# Patient Record
Sex: Male | Born: 1940 | Race: Black or African American | Hispanic: No | Smoking: Never smoker
Health system: Southern US, Community
[De-identification: ages and names within clinical notes are randomized; demographics above are authoritative.]

## PROBLEM LIST (undated history)

## (undated) DIAGNOSIS — G4733 Obstructive sleep apnea (adult) (pediatric): Secondary | ICD-10-CM

## (undated) DIAGNOSIS — E669 Obesity, unspecified: Secondary | ICD-10-CM

## (undated) DIAGNOSIS — I499 Cardiac arrhythmia, unspecified: Secondary | ICD-10-CM

## (undated) DIAGNOSIS — I10 Essential (primary) hypertension: Secondary | ICD-10-CM

## (undated) DIAGNOSIS — N400 Enlarged prostate without lower urinary tract symptoms: Secondary | ICD-10-CM

## (undated) DIAGNOSIS — R0609 Other forms of dyspnea: Secondary | ICD-10-CM

## (undated) DIAGNOSIS — M1990 Unspecified osteoarthritis, unspecified site: Secondary | ICD-10-CM

## (undated) DIAGNOSIS — T8859XA Other complications of anesthesia, initial encounter: Secondary | ICD-10-CM

## (undated) DIAGNOSIS — N289 Disorder of kidney and ureter, unspecified: Secondary | ICD-10-CM

## (undated) DIAGNOSIS — E7800 Pure hypercholesterolemia, unspecified: Secondary | ICD-10-CM

## (undated) DIAGNOSIS — T7840XA Allergy, unspecified, initial encounter: Secondary | ICD-10-CM

## (undated) DIAGNOSIS — T4145XA Adverse effect of unspecified anesthetic, initial encounter: Secondary | ICD-10-CM

## (undated) DIAGNOSIS — M109 Gout, unspecified: Secondary | ICD-10-CM

## (undated) DIAGNOSIS — R0600 Dyspnea, unspecified: Secondary | ICD-10-CM

## (undated) DIAGNOSIS — R0989 Other specified symptoms and signs involving the circulatory and respiratory systems: Secondary | ICD-10-CM

## (undated) HISTORY — PX: KNEE ARTHROPLASTY: SHX992

## (undated) HISTORY — DX: Essential (primary) hypertension: I10

## (undated) HISTORY — DX: Obesity, unspecified: E66.9

## (undated) HISTORY — PX: JOINT REPLACEMENT: SHX530

## (undated) HISTORY — DX: Allergy, unspecified, initial encounter: T78.40XA

## (undated) HISTORY — DX: Pure hypercholesterolemia, unspecified: E78.00

## (undated) HISTORY — DX: Gout, unspecified: M10.9

## (undated) HISTORY — PX: OTHER SURGICAL HISTORY: SHX169

## (undated) HISTORY — DX: Other specified symptoms and signs involving the circulatory and respiratory systems: R09.89

## (undated) HISTORY — DX: Obstructive sleep apnea (adult) (pediatric): G47.33

## (undated) HISTORY — PX: COLONOSCOPY: SHX174

## (undated) HISTORY — DX: Unspecified osteoarthritis, unspecified site: M19.90

## (undated) HISTORY — DX: Disorder of kidney and ureter, unspecified: N28.9

## (undated) HISTORY — DX: Other forms of dyspnea: R06.09

---

## 1996-02-10 HISTORY — PX: OTHER SURGICAL HISTORY: SHX169

## 1997-08-29 ENCOUNTER — Ambulatory Visit (HOSPITAL_COMMUNITY): Admission: RE | Admit: 1997-08-29 | Discharge: 1997-08-29 | Payer: Self-pay | Admitting: Internal Medicine

## 1997-08-30 ENCOUNTER — Ambulatory Visit (HOSPITAL_COMMUNITY): Admission: RE | Admit: 1997-08-30 | Discharge: 1997-08-30 | Payer: Self-pay | Admitting: Internal Medicine

## 1999-04-10 ENCOUNTER — Emergency Department (HOSPITAL_COMMUNITY): Admission: EM | Admit: 1999-04-10 | Discharge: 1999-04-10 | Payer: Self-pay | Admitting: Emergency Medicine

## 1999-08-11 ENCOUNTER — Encounter: Payer: Self-pay | Admitting: Internal Medicine

## 1999-08-11 ENCOUNTER — Ambulatory Visit (HOSPITAL_COMMUNITY): Admission: RE | Admit: 1999-08-11 | Discharge: 1999-08-11 | Payer: Self-pay | Admitting: Internal Medicine

## 1999-08-18 ENCOUNTER — Encounter: Payer: Self-pay | Admitting: Internal Medicine

## 1999-08-18 ENCOUNTER — Encounter: Admission: RE | Admit: 1999-08-18 | Discharge: 1999-08-18 | Payer: Self-pay | Admitting: Internal Medicine

## 1999-09-08 ENCOUNTER — Other Ambulatory Visit: Admission: RE | Admit: 1999-09-08 | Discharge: 1999-09-08 | Payer: Self-pay | Admitting: Urology

## 1999-09-10 ENCOUNTER — Ambulatory Visit (HOSPITAL_COMMUNITY): Admission: RE | Admit: 1999-09-10 | Discharge: 1999-09-10 | Payer: Self-pay | Admitting: Urology

## 1999-09-10 ENCOUNTER — Encounter: Payer: Self-pay | Admitting: Urology

## 1999-10-10 ENCOUNTER — Ambulatory Visit (HOSPITAL_COMMUNITY): Admission: RE | Admit: 1999-10-10 | Discharge: 1999-10-10 | Payer: Self-pay | Admitting: Urology

## 1999-10-10 ENCOUNTER — Ambulatory Visit (HOSPITAL_BASED_OUTPATIENT_CLINIC_OR_DEPARTMENT_OTHER): Admission: RE | Admit: 1999-10-10 | Discharge: 1999-10-10 | Payer: Self-pay | Admitting: Urology

## 1999-10-10 ENCOUNTER — Encounter: Payer: Self-pay | Admitting: Urology

## 2000-07-23 ENCOUNTER — Ambulatory Visit (HOSPITAL_COMMUNITY): Admission: RE | Admit: 2000-07-23 | Discharge: 2000-07-23 | Payer: Self-pay | Admitting: *Deleted

## 2001-01-27 ENCOUNTER — Encounter: Payer: Self-pay | Admitting: Urology

## 2001-01-27 ENCOUNTER — Encounter: Admission: RE | Admit: 2001-01-27 | Discharge: 2001-01-27 | Payer: Self-pay | Admitting: Urology

## 2001-02-18 ENCOUNTER — Ambulatory Visit (HOSPITAL_BASED_OUTPATIENT_CLINIC_OR_DEPARTMENT_OTHER): Admission: RE | Admit: 2001-02-18 | Discharge: 2001-02-18 | Payer: Self-pay | Admitting: Urology

## 2001-02-18 ENCOUNTER — Encounter (INDEPENDENT_AMBULATORY_CARE_PROVIDER_SITE_OTHER): Payer: Self-pay | Admitting: Specialist

## 2002-07-18 ENCOUNTER — Encounter (INDEPENDENT_AMBULATORY_CARE_PROVIDER_SITE_OTHER): Payer: Self-pay | Admitting: Interventional Cardiology

## 2002-07-18 ENCOUNTER — Inpatient Hospital Stay (HOSPITAL_COMMUNITY): Admission: EM | Admit: 2002-07-18 | Discharge: 2002-07-19 | Payer: Self-pay | Admitting: Internal Medicine

## 2002-07-23 ENCOUNTER — Emergency Department (HOSPITAL_COMMUNITY): Admission: EM | Admit: 2002-07-23 | Discharge: 2002-07-23 | Payer: Self-pay | Admitting: Emergency Medicine

## 2002-07-31 ENCOUNTER — Inpatient Hospital Stay (HOSPITAL_COMMUNITY): Admission: AD | Admit: 2002-07-31 | Discharge: 2002-08-01 | Payer: Self-pay | Admitting: Internal Medicine

## 2002-08-01 ENCOUNTER — Encounter: Payer: Self-pay | Admitting: Interventional Cardiology

## 2002-08-07 ENCOUNTER — Ambulatory Visit (HOSPITAL_COMMUNITY): Admission: RE | Admit: 2002-08-07 | Discharge: 2002-08-08 | Payer: Self-pay | Admitting: Internal Medicine

## 2002-12-27 ENCOUNTER — Ambulatory Visit (HOSPITAL_COMMUNITY): Admission: RE | Admit: 2002-12-27 | Discharge: 2002-12-27 | Payer: Self-pay | Admitting: Internal Medicine

## 2010-01-24 ENCOUNTER — Encounter
Admission: RE | Admit: 2010-01-24 | Discharge: 2010-01-24 | Payer: Self-pay | Source: Home / Self Care | Attending: Internal Medicine | Admitting: Internal Medicine

## 2010-04-01 ENCOUNTER — Ambulatory Visit (HOSPITAL_BASED_OUTPATIENT_CLINIC_OR_DEPARTMENT_OTHER): Payer: Medicare Other | Attending: Internal Medicine

## 2010-04-01 DIAGNOSIS — G4733 Obstructive sleep apnea (adult) (pediatric): Secondary | ICD-10-CM | POA: Insufficient documentation

## 2010-04-01 DIAGNOSIS — R259 Unspecified abnormal involuntary movements: Secondary | ICD-10-CM | POA: Insufficient documentation

## 2010-04-16 ENCOUNTER — Encounter (HOSPITAL_COMMUNITY): Payer: Medicare Other | Attending: Orthopedic Surgery

## 2010-04-16 ENCOUNTER — Other Ambulatory Visit: Payer: Self-pay | Admitting: Orthopedic Surgery

## 2010-04-16 DIAGNOSIS — Z01818 Encounter for other preprocedural examination: Secondary | ICD-10-CM | POA: Insufficient documentation

## 2010-04-16 LAB — CBC
HCT: 45.2 % (ref 39.0–52.0)
MCHC: 31.6 g/dL (ref 30.0–36.0)
MCV: 87.1 fL (ref 78.0–100.0)
RDW: 14 % (ref 11.5–15.5)

## 2010-04-16 LAB — DIFFERENTIAL
Basophils Relative: 1 % (ref 0–1)
Eosinophils Relative: 4 % (ref 0–5)
Monocytes Absolute: 0.6 10*3/uL (ref 0.1–1.0)
Monocytes Relative: 8 % (ref 3–12)
Neutrophils Relative %: 50 % (ref 43–77)

## 2010-04-16 LAB — BASIC METABOLIC PANEL
BUN: 15 mg/dL (ref 6–23)
CO2: 33 mEq/L — ABNORMAL HIGH (ref 19–32)
Chloride: 101 mEq/L (ref 96–112)
GFR calc Af Amer: >60 mL/min (ref >60)
GFR calc non Af Amer: 51 mL/min — ABNORMAL LOW (ref >60)

## 2010-04-16 LAB — URINALYSIS, ROUTINE W REFLEX MICROSCOPIC
Nitrite: NEGATIVE
Protein, ur: NEGATIVE mg/dL
Specific Gravity, Urine: 1.022 (ref 1.005–1.030)
Urobilinogen, UA: 0.2 mg/dL (ref 0.0–1.0)
pH: 5.5 (ref 5.0–8.0)

## 2010-04-16 LAB — URINE MICROSCOPIC-ADD ON

## 2010-04-18 NOTE — H&P (Signed)
  NAME:  Pedro Lee, Pedro Lee NO.:  [id number]  MEDICAL RECORD NO.:  [id number]           PATIENT TYPE:  LOCATION:                                 FACILITY:  PHYSICIAN:  Madlyn Frankel. Charlann Boxer, M.D.  DATE OF BIRTH:  January 20, 1941  DATE OF ADMISSION:  04/25/2010 DATE OF DISCHARGE:                             HISTORY & PHYSICAL   ADMITTING DIAGNOSIS:  Left knee osteoarthritis.  BRIEF HISTORY:  Pedro Lee is a pleasant 70 year old gentleman with bilateral knee advanced osteoarthritis scheduled to have left total knee replacement.  He had been seen and evaluated in office and failed conservative measures and at this point is ready to proceed with knee arthroplasty.  Risks and benefits were reviewed and answered in the office setting.  PAST MEDICAL HISTORY: 1. Atrial fibrillation. 2. Hypertension.  SURGICAL HISTORY:  Left knee arthroscopy 15 years ago.  MEDICATIONS:  Toprol, benazepril, Flomax and aspirin.  Dosages will be obtained through the medical reconciliation evaluation through the pharmacist.  DRUG ALLERGIES:  PENICILLIN.  FAMILY HISTORY:  Coronary artery disease and hypertension.  SOCIAL HISTORY:  He is retired.  Denies smoking or alcohol.  PRIMARY CARE PHYSICIAN:  Pedro L. August Saucer, MD  REVIEW OF SYSTEMS:  Otherwise negative for any recent hospitalizations, fevers, chills, night sweats, blurred vision, or slurred speech.  No rhinitis, productive cough, or hemoptysis.  No chest pains.  No abdominal discomfort, nausea, vomiting, burning or frequency with urination.  No unilateral weakness or numbness, only that explained in the musculoskeletal section of HPI.  PHYSICAL EXAMINATION:  GENERAL:  Pedro Lee is a pleasant 70 year old gentleman.  He was awake, alert, and oriented.  He was stable with no blurred vision or slurred speech. VITAL SIGNS:  His blood pressure was 135/86.  His pulse was 72. CHEST:  Clear to auscultation bilaterally. HEART:  At this point had a  stable rhythm. ABDOMEN:  Soft and nontender. SKIN:  Intact with no blistering or cellulitis.  He was otherwise neurovascularly intact distally with no venous lymphatic changes. EXTREMITIES:  Left knee examination revealed tenderness with genu varum with stable ligament, slight flexion contracture and pain with range of motion.  Preoperative labs, EKG and chest x-ray will be performed in the [facility] system.  X-rays of his knees obtained in our office had revealed advanced bilateral knee osteoarthritis.  ASSESSMENT:  Advanced left knee osteoarthritis.  PLAN:  The patient will be admitted for same-day surgery on April 25, 2010, to undergo left total knee replacement.  He will receive a preoperative tranexamic acid for perioperative blood loss purposes.  The questionnaire for preoperative screen was filled out.  He at the time of evaluation weighed 134 kg.  Suggestions will be pending that.  Questions encouraged, answered and reviewed.     Madlyn Frankel Charlann Boxer, M.D.     MDO/MEDQ  D:  04/14/2010  T:  04/14/2010  Job:  [id number]  cc:   Pedro Lee, M.D. [address] [address] [place] [address]  Pedro Lee, M.D. Fax: [phone]  Electronically Signed by Pedro Lee M.D. on 04/18/2010 07:03:27 AM

## 2010-04-25 ENCOUNTER — Inpatient Hospital Stay (HOSPITAL_COMMUNITY)
Admission: RE | Admit: 2010-04-25 | Discharge: 2010-04-28 | DRG: 470 | Disposition: A | Discharge status: Home Health | Payer: Medicare Other | Source: Ambulatory Visit | Attending: Orthopedic Surgery | Admitting: Orthopedic Surgery

## 2010-04-25 DIAGNOSIS — I4891 Unspecified atrial fibrillation: Secondary | ICD-10-CM | POA: Diagnosis present

## 2010-04-25 DIAGNOSIS — R339 Retention of urine, unspecified: Secondary | ICD-10-CM | POA: Diagnosis not present

## 2010-04-25 DIAGNOSIS — I10 Essential (primary) hypertension: Secondary | ICD-10-CM | POA: Diagnosis present

## 2010-04-25 DIAGNOSIS — G4733 Obstructive sleep apnea (adult) (pediatric): Secondary | ICD-10-CM | POA: Diagnosis present

## 2010-04-25 DIAGNOSIS — M1710 Unilateral primary osteoarthritis, unspecified knee: Principal | ICD-10-CM | POA: Diagnosis present

## 2010-04-26 LAB — CBC
Hemoglobin: 10 g/dL — ABNORMAL LOW (ref 13.0–17.0)
MCH: 27.6 pg (ref 26.0–34.0)
MCV: 88.1 fL (ref 78.0–100.0)
RBC: 3.62 MIL/uL — ABNORMAL LOW (ref 4.22–5.81)

## 2010-04-26 LAB — BASIC METABOLIC PANEL
BUN: 12 mg/dL (ref 6–23)
Calcium: 7.4 mg/dL — ABNORMAL LOW (ref 8.4–10.5)
GFR calc non Af Amer: >60 mL/min (ref >60)
Glucose, Bld: 100 mg/dL — ABNORMAL HIGH (ref 70–99)
Potassium: 4.8 mEq/L (ref 3.5–5.1)

## 2010-04-27 LAB — CBC
HCT: 33.7 % — ABNORMAL LOW (ref 39.0–52.0)
MCHC: 32 g/dL (ref 30.0–36.0)
Platelets: 162 10*3/uL (ref 150–400)
RDW: 14.2 % (ref 11.5–15.5)

## 2010-04-27 LAB — BASIC METABOLIC PANEL
CO2: 26 mEq/L (ref 19–32)
Calcium: 8.2 mg/dL — ABNORMAL LOW (ref 8.4–10.5)
GFR calc Af Amer: 59 mL/min — ABNORMAL LOW (ref >60)
Potassium: 3.6 mEq/L (ref 3.5–5.1)

## 2010-04-28 NOTE — Op Note (Signed)
NAME:  Pedro Lee, Pedro Lee                ACCOUNT NO.:  [id number]  MEDICAL RECORD NO.:  [id number]           PATIENT TYPE:  I  LOCATION:  1604                         FACILITY:  [facility]  PHYSICIAN:  Madlyn Frankel. Charlann Boxer, M.D.  DATE OF BIRTH:  Feb 02, 1941  DATE OF PROCEDURE:  04/25/2010 DATE OF DISCHARGE:                              OPERATIVE REPORT   PREOPERATIVE DIAGNOSIS:  Left knee osteoarthritis.  POSTOPERATIVE DIAGNOSIS:  Left knee osteoarthritis.  PROCEDURE:  Left total knee replacement.  COMPONENTS USED:  Rotating platform posterior stabilized knee system size 5 femur, 5 tibia, 12.5 insert, and a 41 patellar button.  SURGEON:  Madlyn Frankel. Charlann Boxer, M.D.  ASSISTANT:  Jaquelyn Bitter. Chabon, P.A.  ANESTHESIA:  Preoperative regional femoral nerve block plus general anesthetic.  SPECIMENS:  None.  COMPLICATIONS:  None.  DRAINS:  One Hemovac.  TOURNIQUET TIME:  49 minutes at 250 mmHg.  ESTIMATED BLOOD LOSS:  Minimal.  INDICATIONS OF PROCEDURE:  [name] is a pleasant 70 year old gentleman who had presented to the office for evaluation of bilateral knee pain. Radiographs revealed advanced degenerative changes.  He had failed respond to conservative measures, treating with other physicians, and at this point wished to discuss surgical options.  Risks of infection, DVT, component failure, need for revision surgery, the postoperative course, and expectations were all reviewed and consent obtained for the benefit of pain relief.  PROCEDURE IN DETAIL:  The patient was brought to the operative theater. Once adequate anesthesia, preoperative antibiotics, clindamycin administered.  The patient was positioned supine with the left thigh tourniquet placed.  The left lower extremity was then prepped and draped in a sterile fashion.  The left foot was placed in the [facility] leg holder.  Time-out was performed identifying the patient, planned procedure, and the extremity.  The left lower extremity  was then exsanguinated, tourniquet elevated to 250 mmHg.  Midline incision was made followed by median parapatellar arthrotomy.  Following initial exposure, attention was first directed to the patella.  Precut measurement was 27 mm.  I resected down to about 14- 15 mm and drilled lug holes, placed in a metal shim to protect the patella from retractors and saw blades.  Attention was now directed to the femur.  The femoral canal was opened to drill, irrigated to try to prevent fat emboli.  The intramedullary rod was then passed and a 5 degrees of valgus I resected 11 mm of bone off the distal femur due to the possibility of being a size 6 femur.  No significant full flexion contracture preoperatively.  Following the resection, I subluxated the tibia anteriorly.  Using an extramedullary guide, I resected approximately 10 mm bone off the proximal lateral tibia.  At this point, I confirmed the gap was stable with at least a 10- mm insert medially and laterally and also that the cut was perpendicular in the coronal plane.  I sized the femur initiallyto a size 6 and pinned the rotation block into place utilizing the C clamp off the proximal tibia and anterior referencing.  Following making the anterior, posterior, and chamfer cuts with a size 6 cutting block,  I recognized that I was a bit anterior on the flange and could in fact drop it down 1 mm or 2.  Based on the assessment at this point of my flexion gap compared to my extension gap, I decided to downsize from a size 6 to a 5 repositioning the 4 in 1 cutting block initially based off the anterior reference and then dropping down 10 mm, I revisited my anterior, posterior, and chamfer cuts removing approximately 2 mm of bone.  There was no notching and in fact now that the femoral cut was a flushed over the anterior surface of the cortex and femur.  The final box was made now of the lateral aspect of distal femur.  I removed medial and  lateral osteophytes.  Tibia was now subluxated anteriorly.  The cut surface was best fit with size 5 tibial tray.  I removed medial osteophytes, pinned the tray into position, drilled, and keel punched.  A trial reduction was now carried out with 5 femur, 5 tibia, and a 12.5 insert.  The knee came to full extension and felt stable from extension and flexion with stable ligaments, the patella tracked through the trochlea without application of pressure.  At this point, all trial components were removed.  Final osteophytes were removed off the posterior aspect of the femur.  Cement was mixed.  The knee was irrigated with normal saline solution, pulse lavage and the synovial capsule junction and the knee injected with 0.25% Marcaine with epinephrine and 1 cc of Toradol.  Following this, the cut surfaces were dried and the components cemented onto clean and dry cut surfaces of bone and the knee brought to extension with a 12.5 insert in place.  Extruded cement was removed.  Once the cement had fully cured, an excessive cement was removed throughout the knee.  I chose a 12.5 insert, which was then placed into the tibial tray.  The knee was then re-irrigated at this point, the tourniquet had been let down, there is no significant hemostasis required, a medium Hemovac drain was placed deep.  The extensor mechanism was then reapproximated using #1 Vicryl with the knee in flexion.  The remaining wound was closed with 2-0 Vicryl and a running 4-0 Monocryl.  The knee was cleaned, dried, and dressed sterilely with Dermabond and Aquacel dressing.  The knee was wrapped loosely with an Ace wrap.  He was then brought to the recovery room, extubated in stable condition tolerating the procedure well.     Madlyn Frankel Charlann Boxer, M.D.     MDO/MEDQ  D:  04/25/2010  T:  04/26/2010  Job:  [id number]  Electronically Signed by Durene Romans M.D. on 04/28/2010 09:48:56 AM

## 2010-04-30 NOTE — Discharge Summary (Signed)
NAME:  Pedro Lee, COOKSEY                ACCOUNT NO.:  [id number]  MEDICAL RECORD NO.:  [id number]           PATIENT TYPE:  I  LOCATION:  1604                         FACILITY:  [facility]  PHYSICIAN:  Madlyn Frankel. Charlann Boxer, M.D.  DATE OF BIRTH:  Feb 02, 1941  DATE OF ADMISSION:  04/25/2010 DATE OF DISCHARGE:  04/28/2010                              DISCHARGE SUMMARY   ADMITTING DIAGNOSIS:  Left knee osteoarthritis.  DISCHARGE DIAGNOSES: 1. Left knee osteoarthritis. 2. History of atrial fibrillation. 3. Hypertension. 4. Urinary retention, following Foley catheter placement.  BRIEF HISTORY:  Pedro Lee is a very pleasant 70 year old gentleman who was evaluated in the office for advanced bilateral knee osteoarthritis. His left knee was clinically worse.  After reviewing the fact he had failed conservative measures and wishing to have improved quality of life, he wished at this point to proceed with knee replacement surgery. Risks and benefits were discussed in the office and surgery was set up.  HOSPITAL COURSE:  The patient was admitted to the hospital for same-day surgery on April 25, 2010.  He underwent an uncomplicated left total knee replacement.  Please see dictated operative note for full details of the procedure.  Postoperatively after a brief stay in the Recovery Room, he was transferred to Orthopedic Ward.  His surgery was performed on a Friday and thus, he was in the hospital over the weekend.  He was seen and evaluated by Physical Therapy and was weightbearing as tolerated and work on range of motion and strengthening.  No CPM was utilized.  His lab work remained stable while in the hospital and by postoperative day #2, he had a stable hematocrit of 33.7 and a platelet count of 162,000. His electrolytes remained stable.  His creatinine was noted to be 1.43 on postop day #2.  Labs were not repeated on date of discharge.  On postop day #1, his Foley catheter was removed.  He did  require several in and out catheterizations and ultimately Foley was replaced. Unfortunately, even at the date of discharge after separate trial of discharge, removing his Foley in the morning and he required replacing it at 4 p.m.  The plan was for him to maintain his Foley himself and then follow up with a urologist within a week.  He had been on Flomax preoperatively, so either he would follow up with his urologist or with [facility].  Arrangements will be made at discharge.  Otherwise, his left knee wound remained dry.  There was no evidence of any complicating features.  By postop day #3, he was ready for discharge with the outlined plan.  DISCHARGE INSTRUCTIONS:  The patient will be discharged home with Home Health Physical Therapy and nurse will come to help with Foley management and care.  From a urinary retention standpoint, he will maintain his Foley for week and follow up with Urology.  Appointment should have been made at the time of discharge, otherwise he can contact our office for assistance with this.  Mr. Pedro Lee should follow up with Dr. Durene Romans at [facility] in 2 weeks' time, office number [phone], appointment was probably  already set.  He is to keep his wound dry, but may shower until followup.  DISCHARGE MEDICATIONS:  Discharge medications include medicines prescribed preoperative including: 1. Colace 100 mg p.o. b.i.d. constipation while on pain medicine. 2. MiraLax 17 gm p.o. daily as needed for constipation while on pain     medicine. 3. Norco 7.5/325 one to two tablets every 4 to 6 hours as needed for     pain. 4. Robaxin 500 mg p.o. q.6h. as needed for muscle spasm or pain. 5. Iron 325 mg p.o. b.i.d. for 2 weeks and then off. 6. Xarelto 10 mg p.o. daily for 10 days and after that he will take     aspirin 325 mg for 4 weeks. 7. In additional, he will continue on his home medications of Toprol,     benazepril, Flomax and  aspirin will be held according to use of     Xarelto.     Madlyn Frankel Charlann Boxer, M.D.     MDO/MEDQ  D:  04/30/2010  T:  04/30/2010  Job:  [id number]  Electronically Signed by Durene Romans M.D. on 04/30/2010 07:20:10 PM

## 2010-05-21 ENCOUNTER — Emergency Department (HOSPITAL_COMMUNITY)
Admission: EM | Admit: 2010-05-21 | Discharge: 2010-05-21 | Disposition: A | Discharge status: Home / Self Care | Payer: Medicare Other | Attending: Emergency Medicine | Admitting: Emergency Medicine

## 2010-05-21 DIAGNOSIS — I10 Essential (primary) hypertension: Secondary | ICD-10-CM | POA: Insufficient documentation

## 2010-05-21 DIAGNOSIS — R300 Dysuria: Secondary | ICD-10-CM | POA: Insufficient documentation

## 2010-05-21 DIAGNOSIS — N390 Urinary tract infection, site not specified: Secondary | ICD-10-CM | POA: Insufficient documentation

## 2010-05-21 LAB — URINALYSIS, ROUTINE W REFLEX MICROSCOPIC
Nitrite: NEGATIVE
Specific Gravity, Urine: 1.02 (ref 1.005–1.030)
Urobilinogen, UA: 0.2 mg/dL (ref 0.0–1.0)

## 2010-05-21 LAB — URINE MICROSCOPIC-ADD ON

## 2010-05-22 ENCOUNTER — Encounter: Payer: Self-pay | Admitting: Internal Medicine

## 2010-05-24 LAB — URINE CULTURE

## 2010-06-05 ENCOUNTER — Encounter: Payer: Self-pay | Admitting: Internal Medicine

## 2010-06-27 NOTE — Op Note (Signed)
NAME:  Pedro Lee, Pedro Lee NO.:  [id number]   MEDICAL RECORD NO.:  [id number]                   PATIENT TYPE:  OIB   LOCATION:  2857                                 FACILITY:  [facility]   PHYSICIAN:  Duke Salvia, M.D.               DATE OF BIRTH:  1940/04/24   DATE OF PROCEDURE:  08/07/2002  DATE OF DISCHARGE:                                 OPERATIVE REPORT   PREOPERATIVE DIAGNOSIS:  Supraventricular tachycardia.   POSTOPERATIVE DIAGNOSIS:  AV nodal reentrant tachycardia.   PROCEDURE:  Invasive electrophysiological study with radiofrequency catheter  ablation; arrhythmia mapping, and conscious sedation.   DESCRIPTION OF PROCEDURE:  Cardiac catheterization was performed with local  anesthesia and conscious sedation.  Noninvasive blood pressure monitoring,  transcutaneous oxygen saturation monitoring were performed continuously  throughout the procedure. Following the procedure, the catheters were  removed, hemostasis was obtained, and the patient was transferred back to  the floor in stable condition.   CATHETERS:  A 5 French quadripolar catheter was inserted via the left  femoral vein to the high right atrium.  A 5 French quadripolar catheter was  inserted via the left femoral vein to the AV junction.  A 5 French  quadripolar catheter was inserted via the left femoral vein to the right  ventricular apex.  A 6 French octapolar catheter was inserted via the right  femoral vein to the coronary sinus. A 7 French 4 mm deflectable tip ablation  catheter was inserted via the right femoral vein to mapping sites in the  posterior septal space.   Surface leads 1, aVF, and V1 were monitored continuously throughout the  procedure.  Following insertion of the catheters, the stimulation protocol  included incremental atrial pacing, incremental ventricular pacing, single  and double atrial extrastimuli at paced cycle lengths of 700 and 600  milliseconds.  Single  ventricular extrastimuli at a paced cycle length of  600 milliseconds.   RESULTS:  Surface electrocardiogram             INITIAL           FINAL  And basic intervals.   Rhythm                              Sinus             Sinus  Cycle length                              915 milliseconds  844 milliseconds  PR interval                         189 milliseconds  194 milliseconds  QRS duration  114 milliseconds  118 milliseconds  QT interval                               407 milliseconds  362 milliseconds  P wave duration                     161 milliseconds  165 milliseconds  Bundle branch block                 Absent            Absent  Preexcitation                       Absent            Absent   AH interval                         71 milliseconds   114 milliseconds  HV interval                         40 milliseconds   49 milliseconds  His bundle duration                 21 milliseconds   21 milliseconds   AV nodal function;  AV Wenckebach preablation was less than 380 milliseconds  which was associated with a tachycardia induction.  Postablation it was 470  milliseconds.  VA conduction Wenckebach at 500 milliseconds.  AV nodal  refractoriness was in the fast pathway at 600 milliseconds was approximately  440 milliseconds followed by induction of tachycardia.  The full window and  curve was not defined.   AV nodal conduction was discontinuous of echobeats and inducible tachycardia  preablation and was continuous albeit with slow antegrade conduction still  identified postablation.  No echobeats or tachycardia were inducible  postablation.   Accessory pathway function;  no evidence of an accessory pathway was  identified.   Ventricular response to programmed stimulation;  normal for the stimulation  as described above.   Arrhythmias induced;  slow-fast AV nodal reentrant was reproducibly induced  with atrial burst pacing at 400 to 420 milliseconds and single  atrial  extrastimuli at 600:400-390.  It was reproducibly terminated with  ventricular pacing at 300 milliseconds.   During a typical episode of tachycardia, the VA time was 36 milliseconds  with an AH interval of 354 milliseconds and an HA interval of 75  milliseconds. The earliest atrial activation was in the His bundle  electrogram.  Ventricular stimulation during His bundle refractoriness  failed to preexcite the atrium.  Ventricular pacing was associated with V-A-  V response.   Radiofrequency energy;  a total of 59 seconds of radio wave energy was  applied in three applications.  The third application was associated with  junctional rhythm and following this application, no further tachycardia was  inducible.  Manipulation of the catheter following this last application was  associated with transient significant prolongation of the PR interval and so  it was elected not to apply any further radiofrequency energy in this region  given the noninducibility.   Fluoroscopy time;  a total of 10 minutes of fluoroscopy time was utilized at  15 frames per second.   IMPRESSION:  1. Normal sinus function.  2. Normal atrial function.  3. 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  Radiofrequency energy successfully modified this slow     pathway rendering it noninducible.  4. Normal His-Purkinje system function.  5. No accessory pathway.  6. Normal ventricular response to programmed stimulation.   CONCLUSION:  The results of electrophysiological testing identified slow-  fast AV nodal reentrant tachycardia as the mechanism underlying the  patient's clinical SVT. Slow pathway modification was successfully  accomplished.  On one application, there was transient retrograde VA block  and following the last application, catheter manipulation was associated with first degree AV block that resolved spontaneously.  The patient  tolerated the  procedure without apparent complications.   RECOMMENDATIONS:  1. Maintain enalapril/HCTZ for blood pressure control.  2.     Discontinue Toprol.  3. Endocarditis prophylaxis for six weeks.  4. Aspirin daily for six weeks.                                               Duke Salvia, M.D.    SCK/MEDQ  D:  08/07/2002  T:  08/07/2002  Job:  [id number]   cc:   Minerva Areola L. August Saucer, M.D.  [address] [address]  [place]  [place] [address]  Fax: [phone]   Lyn Records III, M.D.  [address]  [address]  [place]  [place] [address]  Fax: [phone]

## 2010-06-27 NOTE — Discharge Summary (Signed)
   NAME:  Pedro Lee, Pedro Lee                          ACCOUNT NO.:  [id number]   MEDICAL RECORD NO.:  [id number]                   PATIENT TYPE:  OIB   LOCATION:  2036                                 FACILITY:  [facility]   PHYSICIAN:  Duke Salvia, M.D.               DATE OF BIRTH:  [birth year]   DATE OF ADMISSION:  08/07/2002  DATE OF DISCHARGE:  08/08/2002                                 DISCHARGE SUMMARY   PRIMARY DIAGNOSIS:  Supraventricular tachycardia.   SECONDARY DIAGNOSES:  1. Hypertension.  2. Dyslipidemia.  3. Obesity.  4. Benign prostatic hypertrophy.   HISTORY OF PRESENT ILLNESS:  This is a 70 year old gentleman with a past  medical history of hypertension and dyslipidemia who presents with a  complaint of tachy palps for the past 6 years.  In the past month,  palpations have increased in frequency and duration, followed questionable  diaphoresis positive, had been admitted 2 weeks ago to [facility] with SVT.  He was seen by Dr. Katrinka Blazing and treated with beta blockers but became  bradycardic and hypotensive.  The patient was seen in the hospital as a  consult on July 31, 2002.  At that time it was recommended for SVT ablation.  The patient presented for an SVT ablation.  He underwent flow catheter  modification.  He was discharged to home the following day in stable  condition on all of his previous medications.  He was to not do any heavy  lifting or strenuous activity for the next 48 hours.  He was to take  antibiotics prophylaxis through July.  He was to call and schedule an  appointment with Dr. Sherryl Manges in approximately 6 weeks.     Chinita Pester, C.R.N.P. [facility]                 Duke Salvia, M.D.    DS/MEDQ  D:  08/08/2002  T:  08/08/2002  Job:  [id number]   cc:   Lesleigh Noe, M.D.  [address]  [address]  [place]  [place] [address]  Fax: [phone]    cc:   Lesleigh Noe, M.D.  [address]  [address]  [place]  [place] [address]  Fax:  [phone]

## 2010-06-27 NOTE — Consult Note (Signed)
NAME:  Pedro Lee, Pedro Lee NO.:  [id number]   MEDICAL RECORD NO.:  [id number]                   PATIENT TYPE:  INP   LOCATION:  0374                                 FACILITY:  [facility]   PHYSICIAN:  Lesleigh Noe, M.D.            DATE OF BIRTH:  1940-12-18   DATE OF CONSULTATION:  07/18/2002  DATE OF DISCHARGE:                                   CONSULTATION   REASON FOR CONSULTATION:  Tachypalpitation.   CONCLUSIONS:  1. Supraventricular tachycardia, resolved after admission to the hospital.  2. Hypertension with left ventricular hypertrophy documented by     echocardiogram July 18, 2002.  3. Hyperlipidemia on Crestor therapy.  4. Obesity.   RECOMMENDATIONS:  1. Discontinue Norvasc.  2. Optimize beta-blocker therapy as tolerated for both blood pressure     control and suppression of supraventricular arrhythmia.  3. Consider adding angiotensin-converting enzyme inhibitor/diuretic     combination for better blood pressure control and to help facilitate left     ventricular hypertrophy progression.  4. TSH needs to be checked.  5. Repeat EKG.  6. Get BMP and CK-MB level in the a.m.   COMMENTS:  Pedro Lee is 70 years of age and has a history of  hypertension.  He had a recently-performed Cardiolite study within the past  six that was negative for evidence of ischemia.  He has a 70-71 year history  of intermittent episodes of tachypalpitation.  Starting approximately 10  days ago, he has had persistent heart racing that initially started with  subxiphoid discomfort and has been present most of the time since then.  He  does not feel, however, that the tachycardia has been continuous.  He feels  that he has had prolonged episodes and frequent episodes with some breaks in  the tachycardia.  There have been occasional episodes of severe weakness and  dizziness but he has not fainted.  He denies shortness of breath.  There is  no lower extremity  swelling.  No history of thyroid disease, myocardial  infarction, or congestive heart failure.   ALLERGIES:  PENICILLIN.   MEDICATIONS:  1. Toprol XL 50 mg daily started approximately a week ago.  2. Clindamycin 150 mg p.o. q.i.d.  3. Clarinex 5 mg p.o. daily.  4. Norvasc 5 mg daily.  5. Crestor 20 mg per day.   HABITS:  He does not smoke or drink.   PAST MEDICAL HISTORY:  He has a history of benign prostatic hypertrophy.   FAMILY HISTORY:  No significant family history of heart disease.   REVIEW OF SYSTEMS:  Unremarkable for lower extremity edema; history of  rheumatic fever, gastrointestinal disease, asthma, DVT, pericarditis,  pulmonary emboli, thyroid disease.   PHYSICAL EXAMINATION:  GENERAL:  The patient is comfortable in bed visiting  with some guests  VITAL SIGNS:  His blood pressure is 140/92, the heart rate is 68.  The  monitor reveals sinus rhythm with first-degree AV block.  Respirations are  16 and nonlabored.  O2 saturation on room air is greater than 95%.  SKIN:  Warm and dry.  NECK:  No palpable thyroid.  HEENT:  Pupils are equal and reactive.  No jaundice.  Extraocular movements  are full.  LUNGS:  Clear to auscultation and percussion.  A soft S4 gallop is heard on  cardiac auscultation.  No murmur or rub is heard.  ABDOMEN:  Soft.  The liver edge is not palpable.  No abdominal bruits are  heard.  EXTREMITIES:  No edema.  Pulses are 2+ and symmetric in the upper and lower  extremities.  NEUROLOGIC:  Exam is normal.   LABORATORY DATA:  EKG reveals SVT at a rate of 130 beats per minute with  retrograde P wave within the ST segment.  Chest x-ray report is pending.   CK-MB is 200/4.1.  BMP has not been performed.   Echocardiogram reveals EF of approximately 50%, LVH, mild left apical  enlargement.  Echocardiogram was done during tachycardia preventing an  accurate assessment of LV function.   DISCUSSION:  The patient has had persistent supraventricular  tachycardia now  spontaneously broke perhaps after the addition of a small dose of Cardizem  to oral beta-blocker therapy.  He has had history of recurrent episodes.  We  need to preferentially use antihypertensive therapy but also depressive his  AV nodal conduction.  If he continues to have recurrences of  supraventricular tachycardia, despite significant medical attempts at  suppression, radiofrequency ablation may be necessary.                                               Lesleigh Noe, M.D.    HWS/MEDQ  D:  07/18/2002  T:  07/19/2002  Job:  [id number]   cc:   Minerva Areola L. August Saucer, M.D.  [address] [address]  [place]  [place] [address]  Fax: [phone]

## 2010-06-27 NOTE — H&P (Signed)
NAME:  Pedro Lee, Pedro Lee                          ACCOUNT NO.:  [id number]   MEDICAL RECORD NO.:  [id number]                   PATIENT TYPE:  INP   LOCATION:  3728                                 FACILITY:  [facility]   PHYSICIAN:  Eric L. August Saucer, M.D.                  DATE OF BIRTH:  04-Mar-1940   DATE OF ADMISSION:  07/31/2002  DATE OF DISCHARGE:                                HISTORY & PHYSICAL   CHIEF COMPLAINT:  Increased shortness of breath, rapid heart rate.   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  He noted  at that time the onset of progressive weakness after doing some strenuous  activity around the house.  This was associated with progressive diaphoresis  as well.  He noted that he felt much better when lying supine than standing.  Over the subsequent two days he had episodes of feeling better but became  progressively worse over the past 24 hours.  He noted intermittent problems  with a vague pressure sensation in his neck with these episodes as well.  He  did not have actual chest pain and no substernal pressure.  This morning the  patient became increasingly weak when standing.  He subsequently came to the  office requesting to be seen.  The patient was noted to be in SVT and was  admitted for further evaluation.   His most recent history is significant for a previous episode of  palpitations which was evaluated on July 18, 2002.  The patient at that time  had his Norvasc medication discontinued and beta-blocker was optimized.  He  had previously had a stress Cardiolite performed approximately six months  prior.  This was negative for evidence of ischemia.  His history is  significant also for a distant history of cardiac catheterization  approximately seven years ago per Dr. Algie Coffer.   He has a history  significant for hypertension.  He also has hyperlipidemia  for which he is undergoing treatment.  He has mild obesity as well.   PAST MEDICAL HISTORY:  Otherwise remarkable for left knee surgery.   REVIEW OF SYSTEMS:  As noted above.  He had recently had problems with BPH  symptoms.  He had been placed on Flomax.  The patient also has periodontal  disease for which is presently on clindamycin.   HABITS:  The patient does not smoke or drink.   ALLERGIES:  PENICILLIN.   PRESENT MEDICATIONS:  Toprol XL 50 mg p.o. daily, Crestor 20 mg daily,  Flomax 0.4 mg q.h.s., benazepril hydrochlorothiazide 20/12.5 mg daily,  Vionex 5 mg p.o. q.h.s., and clindamycin 150 mg p.o. q.i.d.   SOCIAL HISTORY:  The patient is married, a Radio producer.   PHYSICAL EXAMINATION:  GENERAL:  He was a weak-appearing black male in mild  distress.  VITAL SIGNS:  Vital signs initially revealed a blood pressure of 80/65,  pulse 140, respiratory rate 20.  Temperature 97.5.  Height 6 feet 4 inches,  weight 296 pounds.  HEENT:  Head normocephalic, atraumatic, without bruits.  Extraocular muscles  intact.  Nonicteric.  There was no sinus tenderness, no optic edema.  TMs  were clear.  Oropharynx clear.  NECK:  Supple, no enlarged thyroid.  No carotid bruits appreciated.  No  pulses or adenopathy.  LUNGS:  Clear, without wheezes or rales.  No E to A changes.  CARDIOVASCULAR:  Notable for rapid rhythm with normal S1 and S2.  No murmur  or rub appreciated.  ABDOMEN:  Bowel sounds were present and normoactive, no liver, spleen, mass  or tenderness.  EXTREMITIES:  Negative edema, negative Homan's.  Pulses 1+ bilaterally.  SKIN:  On admission, diaphoretic.   LABORATORY DATA:  CBC reveals WBC of 7700, hemoglobin 13.2, hematocrit 40.3,  platelets 143,000.  Chemistries:  Sodium 138, potassium 4.0, chloride 102,  CO2 28, BUN 20, creatinine 2.2, glucose 117.  Total protein 6, albumin 3.0.  Magnesium 2.7.  Initial CK of 294,  CK-MB 5.6, troponin 1.01.  Relative index  1.9.   IMPRESSION:  1. Recurrent supraventricular tachycardia.  This is despite recent beta-     blocker usage.  2. Hypotension secondary to #1.  3. Previous history of hypertension.  4. Hyperlipidemia.  5. Mild obesity.   PLAN:  The patient will be admitted for further treatment of his recurrent  SVT.  He will be maintained on a beta-blocker pending reevaluation by Dr.  Katrinka Blazing and associates.  He may be a candidate for radioablation treatment  pending outcome of rate control with medications.  Dr. Katrinka Blazing and associates  have been contacted.  Further therapy thereafter.                                               Eric L. August Saucer, M.D.    ELD/MEDQ  D:  07/31/2002  T:  08/01/2002  Job:  [id number]

## 2010-06-27 NOTE — Consult Note (Signed)
NAME:  Pedro Lee, Pedro Lee NO.:  [id number]   MEDICAL RECORD NO.:  [id number]                   PATIENT TYPE:  INP   LOCATION:  3728                                 FACILITY:  [facility]   PHYSICIAN:  Lesleigh Noe, M.D.            DATE OF BIRTH:  [birth year]   DATE OF CONSULTATION:  07/31/2002  DATE OF DISCHARGE:  08/01/2002                                   CONSULTATION   CONCLUSIONS:  1. Recurrent supraventricular tachycardia at a rate of 124 beats/minute with     Cannon A waves and hypotension present times approximately 48 hours.  2. Mild elevation in CK MB secondary to prolonged tachycardia; doubt     significant obstructive coronary disease.     A. Recent negative exercise treadmill test January 2004.   RECOMMENDATIONS:  1. IV Adenocard 6 mg followed by IV Cardizem.  2. Electrophysiologic evaluation to consider ablative therapy.  3. Dc beta blocker therapy.  4. Start Cardizem orally pending EP evaluation.  5. Consider Cardiolite stress test to be more certain that underlying     coronary disease does not exist.   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  He was recently discharged  from [facility] after a several-day stay and therapy on July 18, 2002 when supraventricular tachycardia was resolved by oral Cardizem.  He  was discharged from the hospital on a higher dose of beta blocker therapy,  which totaled Toprol XL 100 mg per day.  He did well for 10 days and then on  this past Friday developed recurrent SVT, weakness, jaw discomfort and came  to Dr. Diamantina Providence office this morning where he was admitted.   Please see most recent cardiology consultation for more clinical details.   MEDICATIONS:  Medications on  admission to hospital included:  1. Toprol XL 50 mg p.o. b.i.d.  2. Crestor 20 mg per day.  3. Flomax 0.4 mg q.h.s.  4. Enalapril ACE 20/12.5 mg per day.  5. Clindamycin.  6. Clarinex.   ALLERGIES:  PENICILLIN.   HABITS:  Does not smoke or drink.   PAST MEDICAL HISTORY:  Benign prostatic hypertrophy.   FAMILY HISTORY:  Father and a brother have cardiac conditions that he is not  very specific about and does not know details.   OBJECTIVE:  GENERAL:  On exam the patient is lying supine in his hospital  bed.  He is in no acute distress.  VITAL SIGNS:  His blood pressure is 82/60, heart rate 124, and respirations  16 and nonlabored.  SKIN:  Warm and dry.  HEENT:  Exam unremarkable.  NECK:  Exam reveals prominent Cannon A wave when the patient is lying flat.  LUNGS:  The lungs are clear.  CARDIAC:  Exam reveals a regular tachycardia.  No murmur.  No gallop.  ABDOMEN:  Soft.  Liver edge not palpable.  EXTREMITIES:  No edema.   LABORATORY DATA:  EKG reveals tachycardia at a rate of 126 beats/minute with  what appears to be evidence of retrograde P waves.   INTERVENTION:  We hooked the patient up to a portable heart monitor and  administered 60 mg of Adenocard IV with reversion of the SVT to sinus  rhythm, sinus bradycardia at 60 beats/minute.   DISCUSSION:  Despite recent alteration of medical regimen including the  addition of beta blocker therapy the patient has developed apparent PSVT.  I  did not use a combination of beta blocker therapy and calcium channel  blocker therapy because of my concern about excessive bradycardia.  With  this relatively quick prolonged recurrence I believe we need to consider  ablative therapy.  I have discussed this with the patient and will consult  the electrophysiology service.                                                 Lesleigh Noe, M.D.    HWS/MEDQ  D:  08/01/2002  T:  08/01/2002  Job:  [id number]   cc:   Minerva Areola L. August Saucer, M.D.   [address] [address]  [place]  [place] [address]  Fax: [phone]   Duke Salvia, M.D.

## 2010-06-27 NOTE — Discharge Summary (Signed)
NAME:  Pedro Lee, Pedro Lee                          ACCOUNT NO.:  [id number]   MEDICAL RECORD NO.:  [id number]                   PATIENT TYPE:  INP   LOCATION:  0374                                 FACILITY:  [facility]   PHYSICIAN:  Eric L. August Saucer, M.D.                  DATE OF BIRTH:  1940-05-05   DATE OF ADMISSION:  07/18/2002  DATE OF DISCHARGE:  07/19/2002                                 DISCHARGE SUMMARY   FINAL DIAGNOSES:  1. 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  2. Hypertension, 401.9.  3. Cardiomegaly, 429.3.  4. Hyperlipidemia, 272.4.  5. Obesity, 278.00   OPERATIONS/PROCEDURES:  None.   HISTORY OF PRESENT ILLNESS:  This is the first [facility]  admission for this 70 year old married black male who presented to the  office complaining of increasing exertional dyspnea.  This has been  associated with increasing weakness and presyncopal spells as well.  He had  felt well up until approximately one week ago when he noticed spontaneous  increasing heart rate.  He was started on Toprol-XL 50 mg while on Norvasc 5  mg daily by his gastroenterologist.  The patient was seen today in the  office with a blood pressure of 88/72, heart rate 120-140s.  The patient was  subsequently admitted for further observation and stabilization.   PAST MEDICAL HISTORY AND PHYSICAL EXAM:  Per admission H&P.   HOSPITAL COURSE:  The patient was admitted for further evaluation and  treatment of SVT which was felt to be symptomatic.  He has noticed blood  pressure at the time of presentation was 88/72, heart rate running between  122 and 140s.  The patient was placed on telemetry, started on IV fluids.  Cardiac enzymes were obtained q.8h. x3, which were negative for injury.  The  patient was seen acutely by Dr. Verdis Prime of cardiology, who had seen the  patient in January of this year.  The patient at that time had undergone  stress Cardiolite which was negative for ischemia.  Notably, in the  hospital  the patient's rhythm did convert back to normal sinus rhythm.  His blood  pressure stabilized thereafter as well.  Old laboratory data was remarkable  for a normal hemoglobin, normal TSH level at 2.7.  His BNP was 292, which  was mildly elevated.  The patient after 24 hours of monitoring had no  further tachybrady episodes.  He was subsequently placed on Toprol-XL at 50  mg daily.  He was also started on Lotensin/HCT 10/12.5 mg once per day.   The patient was monitored further as he ambulated.  He tolerated medication  well and was subsequently discharged home much improved.   MEDICATIONS AT THE TIME OF DISCHARGE:  As noted:  1. Lotensin/HCT 10/12.5 mg daily.  2. Toprol-XL 50 mg daily.  3. Crestor 20 mg p.o. daily.  4. Clarinex 5 mg p.o. daily.  FOLLOW-UP:  The patient will be seen in the office in two weeks' time for  follow-up.  Follow up with cardiology as well.  Consideration for further  adjustments in medications should these episodes recur.                                               Eric L. August Saucer, M.D.    ELD/MEDQ  D:  08/16/2002  T:  08/17/2002  Job:  [id number]

## 2010-06-27 NOTE — Discharge Summary (Signed)
NAME:  Pedro Lee, Pedro Lee                          ACCOUNT NO.:  [id number]   MEDICAL RECORD NO.:  [id number]                   PATIENT TYPE:  INP   LOCATION:  3728                                 FACILITY:  [facility]   PHYSICIAN:  Eric L. August Saucer, M.D.                  DATE OF BIRTH:  25-Dec-1940   DATE OF ADMISSION:  07/31/2002  DATE OF DISCHARGE:  08/01/2002                                 DISCHARGE SUMMARY   FINAL DIAGNOSES:  1. Cardiac dysrhythmias, 427.89.  2. Hypotension, 458.93.  3. Benign prostatic hypertrophy.  4. Hyperlipidemia, 272.4.  5. Obesity, 278.00.  6. 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  7. Hypertension, 401.9.   PROCEDURES:  None.   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  He  noted at that time onset of progressive weakness after doing some strenuous  activity around the house.  This was associated with progressive diaphoresis  as well.  He felt considerably better when lying supine.  However, over the  subsequent two days prior to admission he became progressively worse.  He  noted intermittent problems with a vague pressure sensation in his neck as  well with these episodes.  On the morning of admission the patient became  increasingly weak when standing.  He subsequently came to the office for  evaluation.  He was noted to be in SVT and was admitted for further  evaluation.  His past medical history and physical examination are as per  admission history and physical.   HOSPITAL COURSE:  The patient was admitted for further evaluation and  treatment of recurrent supraventricular tachycardia.  He had recently  undergone medication management with Dr. Katrinka Blazing.  The patient was placed on  telemetry.  His cardiac enzymes were obtained.  He was seen emergently by  Dr. Katrinka Blazing of cardiology.  He  was given intravenous adenosine with subsequent  conversion to a sinus rhythm.  He was thereafter placed on Cardizem.  Over  the subsequent 24 hours he maintained his rhythm quite well.  Because of  recurrence on medication he was seen in consultation by Dr. Sherryl Manges of  [facility].  Based on the patient's failure to manage his arrhythmia with  medication it was felt he would be an excellent candidate for radioablation  therapy.  It was recommended that the patient be maintained on Cardizem for  the next several days.  He was subsequently planned for radioablation  therapy within the next weeks' time.   The patient was monitored and made ambulatory.  He had no further  arrhythmias on his Cardizem.  He was subsequently felt to be stable for  discharge with plans as noted above.   At the time of discharge the patient was feeling  well with stable rhythm and  blood pressure.   DISCHARGE MEDICATIONS:  1. Cardizem CD 120 mg p.o. daily.  2. Enteric coated aspirin 325 mg p.o. daily.  3. Crestor 20 mg daily.  4. Flomax 0.4 mg daily.  5. Benazepril/hydrochlorothiazide 20/12.5 mg p.o. daily.  6. __________ 5 mg daily.  7. Clindamycin 150 mg q.i.d.   DIET:  The patient will be maintained on a low salt, low fat diet.   ACTIVITY:  Activity will be as tolerated.   DISCHARGE INSTRUCTIONS:  The patient was given specific instructions for his  medication adjustment just prior to his ablation therapy.  This is scheduled  for August 07, 2002.  The patient is to otherwise be seen in my office in two  weeks time.                                                Eric L. August Saucer, M.D.    ELD/MEDQ  D:  09/27/2002  T:  09/28/2002  Job:  [id number]

## 2010-06-27 NOTE — Op Note (Signed)
[facility]  Patient:    Pedro Lee, Pedro Lee Visit Number: [id number] MRN: [id number]          Service Type: NES Location: [facility] Attending Physician:  Lindaann Slough Dictated by:   Lindaann Slough, M.D. Proc. Date: 02/18/01 Admit Date:  02/18/2001                             Operative Report  PREOPERATIVE DIAGNOSIS:  Gross hematuria.  POSTOPERATIVE DIAGNOSIS:  Gross hematuria plus benign prostatic hypertrophy.  PROCEDURE:  SURGEON:  Lindaann Slough, M.D.  ANESTHESIA:  General.  INDICATIONS:  The patient is a 70 year old male who had gross painless hematuria.  He had the same symptoms about two years ago, and workup was negative then.  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  There is some stranding in the fat anterior to the anterior wall of the bladder.  The patient is scheduled today for cystoscopy.  DESCRIPTION OF PROCEDURE:  Under general anesthesia, the patient was prepped and draped, and placed in the dorsolithotomy position.  A #22 Wappler cystoscope was inserted in the bladder.  The patient has trilobar prostatic hypertrophy with a median lobe.  There is no stone or tumor in the bladder. The ureteral orifices are in normal position and shape with clear efflux.  The bladder was then emptied and irrigated with normal saline and bladder washings were sent for cytology.  The cystoscope was removed.  The patient tolerated the procedure well and left the OR in satisfactory condition to the postanesthesia care unit. Dictated by:   Lindaann Slough, M.D. Attending Physician:  Lindaann Slough DD:  02/18/01 TD:  02/19/01 Job: [id number]

## 2010-06-27 NOTE — Op Note (Signed)
NAME:  Pedro Lee, Pedro Lee NO.:  [id number]   MEDICAL RECORD NO.:  [id number]                   PATIENT TYPE:  OIB   LOCATION:  4739                                 FACILITY:  [facility]   PHYSICIAN:  Duke Salvia, M.D.               DATE OF BIRTH:  November 21, 1940   DATE OF PROCEDURE:  12/27/2002  DATE OF DISCHARGE:  12/27/2002                                 OPERATIVE REPORT   PREOPERATIVE DIAGNOSIS:  Recurrent atrioventricular nodal tachycardia.   POSTOPERATIVE DIAGNOSIS:  Recurrent atrioventricular nodal tachycardia.   PROCEDURE:  Invasive electrophysiological study with isoproterenol infusion;  intracardiac echocardiogram and rhythm mapping, radiofrequency catheter  ablation and reassessment for efficacy.   CARDIOLOGIST:  Duke Salvia, M.D.   DESCRIPTION OF PROCEDURE:  On the obtainment of informed consent, the  patient was brought to the electrophysiology laboratory and placed on the  fluoroscopic table in the supine position.  After routine prep and drape,  cardiac catheterization was performed with local anesthesia and conscious  sedation.  Noninvasive blood pressure monitoring, transcutaneous oxygen  saturation monitoring and end-tidal CO2 monitoring were performed  continuously throughout the procedure.  Following the procedure, the  catheters were removed.  Hemostasis was obtained and the patient was  transferred to the floor in stable condition.   CATHETERS:  1. A 5-French quadripolar catheter was inserted via the left femoral vein to     the AV junction to measure the His electrogram.  2. A 5-French quadripolar catheter was inserted via the left femoral vein to     the right ventricular apex.  3. A 5-French quadripolar catheter was inserted via the left femoral vein to     the high right atrium.  This catheter was subsequently removed.  The     sheath active site was used to deploy an 8-French intracardiac     echocardiogram catheter to the  right atrium.  4. The 6-French octapolar catheter was inserted via the right femoral vein     to the coronary sinus.  5. A 7-French 4 mm deflectable tip ablation catheter was inserted via the     right femoral vein using a SL2 sheath to mapping sites in the posterior     septal space.   Surface leads I, aVF and V1 were monitored continuously throughout the  procedure.  Following insertion of the catheters, the stimulation protocol  included incremental atrial pacing, incremental ventricular pacing.   The single and double atrial extrastimuli had a pace cycle length of 700 mm.   RESULTS:  Surface electrocardiogram. (Final on isoproterenol at 2 mcg per  minute).  Rhythm:  Initial is sinus.  Final is sinus.  Cycle length:  Initial is 1222 msec.  Final is 1422 msec.  P-R interval: Initial is 159 msec.  Final is 156 msec  QRS duration: Initial is 134 msec.  Final is 119 msec.  Q-T interval:  Initial is 446 msec.  Final is 421 msec.  P-wave duration:  Initial is 121 msec.  Final is 137 msec.  Bundle branch block:  Initial is absent.  Final is absent.  Preexcitation:  Initial is absent.  Final is absent.   A-H interval:  Initial is 39 msec.  Final is 63 msec.  HV interval:  Initial is 57 msec.  Final is 56 msec.  His bundle duration is 12 msec initial and 23 msec final.   AV nodal function:  V Wenckebach:  Final was 500 msec.  VA Wenckebach cycle is 400 msec:   The AV nodal conduction was discontinuous with inducible tachycardia  preablation and continuous in the absence and presence of isoproterenol post  ablation.   The AV nodal effector refractor period on the fast pathway had a pace cycle  length of 700 msec preablation with 580 msec and post ablation was 380 msec.   Accessory pathway function:  No evidence of an accessory pathway was  identified.   Arrythmias induced:  AV nodal re-entry tachycardia has a cycle length of 536  msec and was reproducibly induced with atrial  programmed stimulation from  the coronary sinuses, 700:400-440 msec.  It was reproducibly terminated with  atrial pacing.   Characteristics of the tachycardia included:  1. Early activation and His bundle electrogram.  2. No atrial preexcitation with ventricular pacing during His     refractoriness.  3. Dependent on age, prolongation.   Fluoroscopy time:  A total of approximately 1 minute 42 seconds of radio  wave energy was applied.  The catheter could be localized on intracardiac  echocardiographic beat the tricuspid valve and the coronary sinus os as well  as where anatomically we were in the posterior septal region.  There was a  very broad electrogram in this region and concerns were actually evident for  a His electrogram that was evident from the mid position of the tricuspid  annulus all the way up to the anterior portion.  Programmed stimulation  demonstrated that sites of ablation had no evidence of a His electrogram.   Fluoroscopy time: A  total of 8 minutes 55 seconds of fluoroscopy time was  utilized at 7-1/2 frames per second.   IMPRESSION:  1. Normal sinus function.  2. Normal atrial function.  3. Discontinuous atrioventricular nodal conduction with inducible slow and     fast atrioventricular nodal re-entry tachycardia.  Intracardiac     echocardiographic-guided radiofrequency catheter ablation successfully     resulted in the interruption of antegrade flow pathway conduction     illuminating the substrate for the patient's atrioventricular nodal     reentry tachycardia for normal His Purkinje system function.  4. No accessory pathway.  5. Normal ventricular response to programmed stimulation.   SUMMARY/CONCLUSION:  The results of the electrophysiologic testing confirmed  recurrent atrioventricular nodal re-entry tachycardia as the patient's  arrhythmia mechanism.  Intracardiac echocardiogram and electrogram guided radiofrequency ablation  successfully resulted  in the elimination of antegrade flow pathway function  and hopefully this will translate into a long-term cure for the patient's  arrhythmia.  I would estimate a recurrence rate in the 2-3% range.   We will plan to watch the patient for today and consider discharge either  this evening or in the morning.  Duke Salvia, M.D.    SCK/MEDQ  D:  12/27/2002  T:  12/28/2002  Job:  [id number]   cc:   Minerva Areola L. August Saucer, M.D.  [address] [address]  [place]  [place] [address]  Fax: [phone]   Lyn Records III, M.D.  [address]  [address]  [place]  [place] [address]  Fax: [phone]   Electrophysiology Lab

## 2010-06-27 NOTE — Discharge Summary (Signed)
NAME:  INMER, NIX NO.:  [id number]   MEDICAL RECORD NO.:  [id number]                   PATIENT TYPE:  OIB   LOCATION:  4739                                 FACILITY:  [facility]   PHYSICIAN:  Duke Salvia, M.D.               DATE OF BIRTH:  06-29-1940   DATE OF ADMISSION:  12/27/2002  DATE OF DISCHARGE:  12/27/2002                                 DISCHARGE SUMMARY   This is a 70 year old gentleman who was admitted for recurrent tachy  palpitations.   PRIMARY DIAGNOSIS:  Supraventricular tachycardia.   The patient was seen for recurrent tachy palpitations following slow pathway  modification for inducible slow-fast AV nodal reentry.  The patient had an  event recorder which demonstrated recurrent supraventricular tachycardia.  The patient was then admitted for a remodification of his slow pathway.  The  patient underwent successful slow pathway remodification, tolerated the  procedure well with no immediate postoperative complications and was to be  discharged later that evening if he remained in stable condition.   DISCHARGE MEDICATIONS:  1. Enteric coated aspirin 81 mg daily.  2. Clarinex as needed.  3. Benazepril/hydrochlorothiazide 20/12.5 mg b.i.d.  4. Tylenol one to two tablets every four to six hours as needed for pain.   ACTIVITY:  No heavy lifting or strenuous activity for four days. No driving  for two days.   He was to call if he developed any lump or drainage in his groin or  temperature.   FOLLOW UP:  He was to follow up with Dr. Graciela Husbands on February 13, 2003, at 2 p.m.      Chinita Pester, C.R.N.P. [facility]                 Duke Salvia, M.D.    DS/MEDQ  D:  12/27/2002  T:  12/28/2002  Job:  [id number]

## 2010-06-27 NOTE — Procedures (Signed)
[facility]  Patient:    Pedro Lee, Pedro Lee                       MRN: [id number] Proc. Date: 10/10/99 Adm. Date:  [id number] Attending:  Lindaann Slough CC:         Lind Guest. August Saucer, M.D.   Procedure Report  PREOPERATIVE DIAGNOSIS:  Gross hematuria, rule out bladder tumor.  POSTOPERATIVE DIAGNOSES:  Gross hematuria, with benign prostatic hypertrophy.  PROCEDURE:  Cystoscopy.  SURGEON:  Lindaann Slough, M.D.  INDICATIONS:  The patient is a 70 year old male who had gross painless hematuria.  An IVP showed a simple cyst in the lower pole of the left kidney, and a 2.7 cm mass at the base of the bladder.  He is scheduled today for a cystoscopy to rule out bladder tumor.  DESCRIPTION OF PROCEDURE:  Under general anesthesia the patient was prepped and draped and placed in the dorsal lithotomy position.  A #21 Wappler cystoscope was inserted into the bladder.  The urethra is normal.  The prostate gland is enlarged, with a median lobe.  There is no stone or tumor in the bladder.  The ureteral orifices are in normal position and shape, with clear efflux.  The bladder was then emptied and irrigated with normal saline and the bladder washings were sent for cytology.  The bladder was then emptied, and the cystoscope removed.  The patient tolerated the procedure well and left the operating room in satisfactory condition to the post anesthesia care unit. DD:  10/10/99 TD:  10/11/99 Job: [id number]

## 2010-06-27 NOTE — Consult Note (Signed)
NAME:  MUNG, RINKER NO.:  [id number]   MEDICAL RECORD NO.:  [id number]                   PATIENT TYPE:  INP   LOCATION:  3728                                 FACILITY:  [facility]   PHYSICIAN:  Duke Salvia, M.D.               DATE OF BIRTH:  [birth year]   DATE OF CONSULTATION:  07/31/2002  DATE OF DISCHARGE:                                   CONSULTATION   REASON FOR CONSULTATION:  Thank you very much for asking me to see [name] in electrophysiologic consultation  for recurrent  supraventricular tachycardia.   Pedro Lee is a 70 year old gentleman who is retired from __________  [employer], and whose wife is a Clinical research associate, who has a 6 to 7 year history of  recurrent episodes of palpitations, some of which are associated with tachy  palpitations. These have been increasingly frequent over time, although the  patient is not very good at quantitating frequency or duration of these  episodes. He was hospitalized in early June for an episode and then was  hospitalized again today after presenting with recurrent tachycardia.  Symptom-wise, this is associated with fraught positivity, some chest  discomfort, and the awareness of his heartbeat. He does not have light  headedness or shortness of breath with this.   The patient was treated today with Cardizem which failed to terminate it and  then Adenocard which did terminate it. The patient has no exertional chest  discomfort. He had a Cardiolite scan 6 months ago which he says he did not  complete but which in the notes is referred to as having been nonischemic.  He had an echo at [facility] which demonstrated normal left  ventricular function and normal valvular function.   His cardiac risk factors are notable for hypertension and dyslipidemia. He  does not smoke and does not have diabetes.   When he was seen last, he was treated with a beta blocker. This was  associated with relative  hypotension, symptomatic bradycardia as well as  failing to control his supraventricular tachycardia.   MEDICATIONS:  His other medications in addition to the Toprol 50 b.i.d.  include aspirin and a Cardizem drip.   ALLERGIES:  PENICILLIN.   PAST MEDICAL HISTORY:  In addition to the above is notable for:  1. BPH.  2. Remote ulcer.   REVIEW OF SYSTEMS:  Otherwise negative as noted on the intake sheet.   SOCIAL HISTORY:  He is married as noted.  He has 1 child. He does not use  cigarettes or recreational drugs. He does drink rarely.   PHYSICAL EXAMINATION:  GENERAL:  He is an older, African-American male in no  acute distress.  VITAL SIGNS:  His blood pressure right now is 98/50, although it had  previously been in the 140s. His pulses was 64 and regular.  HEENT:  Demonstrates no scleral icterus, no  xanthomata.  NECK:  The neck veins were flat. The carotids were brisk and full  bilaterally without bruise.  BACK:  No kyphosis or scoliosis.  LUNGS:  Clear.  HEART:  Heart sounds were regular without murmur, rub or gallops.  ABDOMEN:  Soft with normoactive bowel sounds without pulsations or  hepatomegaly.  EXTREMITIES:  Femoral pulses were 2+. Distal pulses were intact. There is no  clubbing, cyanosis or edema.  NEUROLOGIC:  Grossly  normal.  SKIN:  Warm and dry.   LABORATORY DATA:  An electrocardiogram showed tachycardia. I gave to his  wife, but demonstrated a narrow QRS tachycardia  with a cycle length of  about 400 milliseconds with a pseudo R prime in lead V1 consistent with AV  nodal reentry.   His other laboratories were normal apart from a mildly elevated CK and MB  with a normal troponin.   IMPRESSION:  1. Nodal reentry tachycardia, recurrent.  2. Hypertension.  3. Dyslipidemia.  4. Minimal elevations of his enzymes in the setting of some atypical chest     pain.  5. Unclear status as it relates to a Cardiolite.   DISCUSSION:  Pedro Lee has recurrent  supraventricular tachycardia that has  been both resistant to medical therapy and for which medical therapy has  been associated with problems including  bradycardia and hypertension. We  discussed treatment options including __________ drug therapy and RF  catheter ablation. We discussed the potential benefits as well as potential  risks including but not limited to 1 in 1000 risk of heart attack, stroke  and/or dying, 1 in 100 risk of heart block requiring pacer and 1 in 300 risk  of perforation as well as the potential for vascular injury. They understand  these risks and would like to proceed.   RECOMMENDATIONS:  Based on the above therefore:  1. Continue with his Cardizem for the next couple of days.  2.     Plan radiofrequency catheter ablation next  Monday.  3. Will need to clarify the Cardiolite scan status prior to then.   Thank you very much for this consultation.                                               Duke Salvia, M.D.    SCK/MEDQ  D:  07/31/2002  T:  08/01/2002  Job:  [id number]   cc:   Lesleigh Noe, M.D.  [address]  [address]  [place]  [place] [address]  Fax: [phone]   Lind Guest. August Saucer, M.D.  [address] [address]  [place]  [place] [address]  Fax: [phone]

## 2011-06-30 LAB — BASIC METABOLIC PANEL
BUN: 15 mg/dL (ref 4–21)
Creatinine: 1.4 mg/dL — AB (ref 0.6–1.3)
Glucose: 80 mg/dL
Potassium: 4.3 mmol/L (ref 3.4–5.3)
Sodium: 142 mmol/L (ref 137–147)

## 2011-06-30 LAB — HEPATIC FUNCTION PANEL: AST: 19 U/L (ref 14–40)

## 2012-04-07 ENCOUNTER — Ambulatory Visit
Admission: RE | Admit: 2012-04-07 | Discharge: 2012-04-07 | Disposition: A | Discharge status: Home / Self Care | Payer: Medicare Other | Source: Ambulatory Visit | Attending: Internal Medicine | Admitting: Internal Medicine

## 2012-04-07 ENCOUNTER — Other Ambulatory Visit: Payer: Self-pay | Admitting: Internal Medicine

## 2012-04-07 DIAGNOSIS — R52 Pain, unspecified: Secondary | ICD-10-CM

## 2012-06-22 ENCOUNTER — Encounter: Payer: Self-pay | Admitting: Hematology

## 2012-10-18 ENCOUNTER — Other Ambulatory Visit: Payer: Self-pay | Admitting: Internal Medicine

## 2012-10-18 ENCOUNTER — Ambulatory Visit
Admission: RE | Admit: 2012-10-18 | Discharge: 2012-10-18 | Disposition: A | Discharge status: Home / Self Care | Payer: Medicare Other | Source: Ambulatory Visit | Attending: Internal Medicine | Admitting: Internal Medicine

## 2012-11-14 ENCOUNTER — Other Ambulatory Visit: Payer: Self-pay | Admitting: Internal Medicine

## 2012-11-14 ENCOUNTER — Ambulatory Visit
Admission: RE | Admit: 2012-11-14 | Discharge: 2012-11-14 | Disposition: A | Discharge status: Home / Self Care | Payer: Medicare Other | Source: Ambulatory Visit | Attending: Internal Medicine | Admitting: Internal Medicine

## 2012-11-14 DIAGNOSIS — R609 Edema, unspecified: Secondary | ICD-10-CM

## 2012-11-14 DIAGNOSIS — R52 Pain, unspecified: Secondary | ICD-10-CM

## 2012-11-16 ENCOUNTER — Telehealth: Payer: Self-pay | Admitting: Interventional Cardiology

## 2012-11-16 NOTE — Telephone Encounter (Signed)
New message   Echo results

## 2012-11-20 NOTE — Telephone Encounter (Signed)
Echo shows impact of poor BP control with LVH, aortic enlargement, and LA enlargement.We need tight BP control. Copy not to PCP, Dr Willey Blade

## 2012-11-22 NOTE — Telephone Encounter (Signed)
pt notifieed of echo results and copy forwarded to pt pcp Dr.Eric August Saucer. pt verbalized understanding.

## 2013-04-19 ENCOUNTER — Telehealth: Payer: Self-pay

## 2013-04-19 ENCOUNTER — Telehealth: Payer: Self-pay | Admitting: Interventional Cardiology

## 2013-04-19 NOTE — Telephone Encounter (Signed)
Received request from Nurse fax box, documents faxed for surgical clearance. °To: [facility] °Fax number: [phone] °Attention: °[phone]/kdm °

## 2013-04-19 NOTE — Telephone Encounter (Signed)
Cardiac clearance given to medical records to fax to [facility] ortho [phone]

## 2013-05-19 ENCOUNTER — Encounter (HOSPITAL_COMMUNITY): Payer: Self-pay | Admitting: Pharmacy Technician

## 2013-05-24 ENCOUNTER — Encounter (HOSPITAL_COMMUNITY)
Admission: RE | Admit: 2013-05-24 | Discharge: 2013-05-24 | Disposition: A | Discharge status: Home / Self Care | Payer: Medicare Other | Source: Ambulatory Visit | Attending: Orthopedic Surgery | Admitting: Orthopedic Surgery

## 2013-05-24 ENCOUNTER — Encounter (INDEPENDENT_AMBULATORY_CARE_PROVIDER_SITE_OTHER): Payer: Self-pay

## 2013-05-24 ENCOUNTER — Encounter (HOSPITAL_COMMUNITY): Payer: Self-pay

## 2013-05-24 DIAGNOSIS — Z01812 Encounter for preprocedural laboratory examination: Secondary | ICD-10-CM | POA: Insufficient documentation

## 2013-05-24 HISTORY — DX: Adverse effect of unspecified anesthetic, initial encounter: T41.45XA

## 2013-05-24 HISTORY — DX: Cardiac arrhythmia, unspecified: I49.9

## 2013-05-24 HISTORY — DX: Other complications of anesthesia, initial encounter: T88.59XA

## 2013-05-24 HISTORY — DX: Benign prostatic hyperplasia without lower urinary tract symptoms: N40.0

## 2013-05-24 LAB — URINALYSIS, ROUTINE W REFLEX MICROSCOPIC
Bilirubin Urine: NEGATIVE
Glucose, UA: NEGATIVE mg/dL
Hgb urine dipstick: NEGATIVE
Ketones, ur: NEGATIVE mg/dL
LEUKOCYTES UA: NEGATIVE
Nitrite: NEGATIVE
PROTEIN: NEGATIVE mg/dL
Specific Gravity, Urine: 1.02 (ref 1.005–1.030)
Urobilinogen, UA: 0.2 mg/dL (ref 0.0–1.0)
pH: 6.5 (ref 5.0–8.0)

## 2013-05-24 LAB — BASIC METABOLIC PANEL
BUN: 14 mg/dL (ref 6–23)
CALCIUM: 9.5 mg/dL (ref 8.4–10.5)
CHLORIDE: 104 meq/L (ref 96–112)
CO2: 29 mEq/L (ref 19–32)
CREATININE: 1.49 mg/dL — AB (ref 0.50–1.35)
GFR calc non Af Amer: 45 mL/min — ABNORMAL LOW (ref >90)
GFR, EST AFRICAN AMERICAN: 52 mL/min — AB (ref >90)
Glucose, Bld: 87 mg/dL (ref 70–99)
Potassium: 4.3 mEq/L (ref 3.7–5.3)
SODIUM: 142 meq/L (ref 137–147)

## 2013-05-24 LAB — CBC
HEMATOCRIT: 42.7 % (ref 39.0–52.0)
Hemoglobin: 13.8 g/dL (ref 13.0–17.0)
MCH: 28 pg (ref 26.0–34.0)
MCHC: 32.3 g/dL (ref 30.0–36.0)
MCV: 86.6 fL (ref 78.0–100.0)
PLATELETS: 180 10*3/uL (ref 150–400)
RBC: 4.93 MIL/uL (ref 4.22–5.81)
RDW: 14.6 % (ref 11.5–15.5)
WBC: 5 10*3/uL (ref 4.0–10.5)

## 2013-05-24 LAB — SURGICAL PCR SCREEN
MRSA, PCR: NEGATIVE
STAPHYLOCOCCUS AUREUS: NEGATIVE

## 2013-05-24 LAB — APTT: APTT: 28 s (ref 24–37)

## 2013-05-24 LAB — PROTIME-INR
INR: 1.01 (ref 0.00–1.49)
Prothrombin Time: 13.1 seconds (ref 11.6–15.2)

## 2013-05-24 NOTE — Patient Instructions (Addendum)
   YOUR SURGERY IS SCHEDULED AT [facility]  ON:  Tuesday  4/21  REPORT TO  SHORT STAY CENTER AT:  7:30 AM      PHONE # FOR SHORT STAY IS [phone]  DO NOT EAT OR DRINK ANYTHING AFTER MIDNIGHT THE NIGHT BEFORE YOUR SURGERY.  YOU MAY BRUSH YOUR TEETH, RINSE OUT YOUR MOUTH--BUT NO WATER, NO FOOD, NO CHEWING GUM, NO MINTS, NO CANDIES, NO CHEWING TOBACCO.  PLEASE TAKE THE FOLLOWING MEDICATIONS THE AM OF YOUR SURGERY WITH A FEW SIPS OF WATER:  ALLOPURINOL, AVODART, METOPROLOL - IF IT IS THE DAY YOU TAKE IT.  MAY TAKE RAPAFLO  I DO NOT BRING VALUABLES, MONEY, CREDIT CARDS.  DO NOT WEAR JEWELRY, MAKE-UP, NAIL POLISH AND NO METAL PINS OR CLIPS IN YOUR HAIR. CONTACT LENS, DENTURES / PARTIALS, GLASSES SHOULD NOT BE WORN TO SURGERY AND IN MOST CASES-HEARING AIDS WILL NEED TO BE REMOVED.  BRING YOUR GLASSES CASE, ANY EQUIPMENT NEEDED FOR YOUR CONTACT LENS. FOR PATIENTS ADMITTED TO THE HOSPITAL--CHECK OUT TIME THE DAY OF DISCHARGE IS 11:00 AM.  ALL INPATIENT ROOMS ARE PRIVATE - WITH BATHROOM, TELEPHONE, TELEVISION AND WIFI INTERNET.  PLEASE READ OVER ANY  FACT SHEETS THAT YOU WERE GIVEN: MRSA INFORMATION, BLOOD TRANSFUSION INFORMATION, INCENTIVE SPIROMETER INFORMATION.  FAILURE TO FOLLOW THESE INSTRUCTIONS MAY RESULT IN THE CANCELLATION OF YOUR SURGERY. PLEASE BE AWARE THAT YOU MAY NEED ADDITIONAL BLOOD DRAWN DAY OF YOUR SURGERY  PATIENT SIGNATURE_________________________________

## 2013-05-24 NOTE — Pre-Procedure Instructions (Signed)
CXR REPORT IN EPIC FROM 10-18-12. NUCLEAR STRESS TEST REPORT WITH RESTING EKG  10-21-12 IN EPIC. ECHO REPORT IN EPIC 11-03-12. CARDIOLOGY OFFICE NOTES 09-27-12 ON CHART.  MEDICAL CLEARANCE DR. [place] ON PT'S CHART. PT'S BMET REPORT ROUTED TO DR. Aurea Graff OFFICE VIA EPIC  CREATININE 1.49

## 2013-05-25 NOTE — H&P (Signed)
TOTAL KNEE ADMISSION H&P  Patient is being admitted for right total knee arthroplasty.  Subjective:  Chief Complaint:     Right knee OA / pain.  HPI: Pedro Lee, 73 y.o. male, has a history of pain and functional disability in the right knee due to arthritis and has failed non-surgical conservative treatments for greater than 12 weeks to includeNSAID's and/or analgesics, corticosteriod injections and activity modification.  Onset of symptoms was gradual, starting 3+ years ago with gradually worsening course since that time. The patient noted prior procedures on the knee to include  arthroplasty on the left knee per Dr. [name] 3 years ago.  Patient currently rates pain in the right knee(s) at 7 out of 10 with activity. Patient has worsening of pain with activity and weight bearing, pain that interferes with activities of daily living, pain with passive range of motion, crepitus and joint swelling.  Patient has evidence of periarticular osteophytes and joint space narrowing by imaging studies. There is no active infection.  Risks, benefits and expectations were discussed with the patient.  Risks including but not limited to the risk of anesthesia, blood clots, nerve damage, blood vessel damage, failure of the prosthesis, infection and up to and including death.  Patient understand the risks, benefits and expectations and wishes to proceed with surgery.  D/C Plans:   Home with HHPT  Post-op Meds:  No Rx given  Tranexamic Acid:   To be given  Decadron:    To be given  FYI:    ASA post-op  Norco post-op    Past Medical History  Diagnosis Date  . Essential hypertension, malignant   . Osteoarthrosis, unspecified whether generalized or localized, unspecified site   . Other dyspnea and respiratory abnormality   . Pure hypercholesterolemia   . Gout, unspecified   . Osteoarthritis   . Obesity, mild   . Allergy   . OSA (obstructive sleep apnea)     Mild - PT STATES HE DID NOT HAVE TO USE CPAP   . 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  Marland Kitchen Renal insufficiency, mild     PT STATES NOT AWARE OF ANY KIDNEY PROBLEM  . Complication of anesthesia     HX OF ENLARGED PROSTATE AND UNABLE TO PASS WATER AFTER ANESTHESIA  . Prostate enlargement     DR. NESI IS PT'S UROLOGIST    Past Surgical History  Procedure Laterality Date  . Left knee arthroscopy    . Joint replacement      LEFT TOTAL KNEE REPLACEMENT  . Heart ablation for af      No prescriptions prior to admission   Allergies  Allergen Reactions  . Penicillins Rash    History  Substance Use Topics  . Smoking status: Never Smoker   . Smokeless tobacco: Never Used  . Alcohol Use: No    No family history on file.   Review of Systems  Constitutional: Negative.   HENT: Negative.   Eyes: Negative.   Respiratory: Positive for shortness of breath (on exertion).   Cardiovascular: Negative.   Gastrointestinal: Negative.   Genitourinary: Negative.   Musculoskeletal: Positive for joint pain.  Skin: Negative.   Neurological: Negative.   Endo/Heme/Allergies: Positive for environmental allergies.  Psychiatric/Behavioral: Negative.     Objective:  Physical Exam  Constitutional: He is oriented to person, place, and time. He appears well-developed and well-nourished.  HENT:  Head: Normocephalic and atraumatic.  Mouth/Throat: Oropharynx is  clear and moist. He has dentures.  Eyes: Pupils are equal, round, and reactive to light.  Neck: Neck supple. No JVD present. No tracheal deviation present. No thyromegaly present.  Cardiovascular: Normal rate, regular rhythm, normal heart sounds and intact distal pulses.   Respiratory: Effort normal and breath sounds normal. No stridor. No respiratory distress. He has no wheezes.  GI: Soft. There is no tenderness. There is no guarding.  Musculoskeletal:       Right knee: He exhibits decreased range of motion, swelling and bony tenderness. He exhibits no  ecchymosis, no deformity, no laceration and no erythema. Tenderness found.  Lymphadenopathy:    He has no cervical adenopathy.  Neurological: He is alert and oriented to person, place, and time.  Skin: Skin is warm and dry.  Psychiatric: He has a normal mood and affect.     Imaging Review Plain radiographs demonstrate severe degenerative joint disease of the right knee(s). The overall alignment isneutral. The bone quality appears to be good for age and reported activity level.  Assessment/Plan:  End stage arthritis, right knee   The patient history, physical examination, clinical judgment of the provider and imaging studies are consistent with end stage degenerative joint disease of the right knee(s) and total knee arthroplasty is deemed medically necessary. The treatment options including medical management, injection therapy arthroscopy and arthroplasty were discussed at length. The risks and benefits of total knee arthroplasty were presented and reviewed. The risks due to aseptic loosening, infection, stiffness, patella tracking problems, thromboembolic complications and other imponderables were discussed. The patient acknowledged the explanation, agreed to proceed with the plan and consent was signed. Patient is being admitted for inpatient treatment for surgery, pain control, PT, OT, prophylactic antibiotics, VTE prophylaxis, progressive ambulation and ADL's and discharge planning. The patient is planning to be discharged home with home health services.    [place] Pugh Jason Hauge   PAC  05/25/2013, 8:59 PM

## 2013-05-30 ENCOUNTER — Encounter (HOSPITAL_COMMUNITY)
Admission: RE | Disposition: A | Discharge status: Home Health | Payer: Self-pay | Source: Ambulatory Visit | Attending: Orthopedic Surgery

## 2013-05-30 ENCOUNTER — Inpatient Hospital Stay (HOSPITAL_COMMUNITY)
Admission: RE | Admit: 2013-05-30 | Discharge: 2013-05-31 | DRG: 470 | Disposition: A | Discharge status: Home Health | Payer: Medicare Other | Source: Ambulatory Visit | Attending: Orthopedic Surgery | Admitting: Orthopedic Surgery

## 2013-05-30 ENCOUNTER — Encounter (HOSPITAL_COMMUNITY): Payer: Self-pay | Admitting: *Deleted

## 2013-05-30 ENCOUNTER — Inpatient Hospital Stay (HOSPITAL_COMMUNITY): Payer: Medicare Other | Admitting: Anesthesiology

## 2013-05-30 ENCOUNTER — Encounter (HOSPITAL_COMMUNITY): Payer: Medicare Other | Admitting: Anesthesiology

## 2013-05-30 DIAGNOSIS — E7800 Pure hypercholesterolemia, unspecified: Secondary | ICD-10-CM | POA: Diagnosis present

## 2013-05-30 DIAGNOSIS — E669 Obesity, unspecified: Secondary | ICD-10-CM | POA: Diagnosis present

## 2013-05-30 DIAGNOSIS — D500 Iron deficiency anemia secondary to blood loss (chronic): Secondary | ICD-10-CM | POA: Diagnosis not present

## 2013-05-30 DIAGNOSIS — Z01812 Encounter for preprocedural laboratory examination: Secondary | ICD-10-CM

## 2013-05-30 DIAGNOSIS — Z96659 Presence of unspecified artificial knee joint: Secondary | ICD-10-CM

## 2013-05-30 DIAGNOSIS — M109 Gout, unspecified: Secondary | ICD-10-CM | POA: Diagnosis present

## 2013-05-30 DIAGNOSIS — D62 Acute posthemorrhagic anemia: Secondary | ICD-10-CM | POA: Diagnosis not present

## 2013-05-30 DIAGNOSIS — M6580 Other synovitis and tenosynovitis, unspecified site: Secondary | ICD-10-CM | POA: Diagnosis present

## 2013-05-30 DIAGNOSIS — Z6835 Body mass index (BMI) 35.0-35.9, adult: Secondary | ICD-10-CM

## 2013-05-30 DIAGNOSIS — M1710 Unilateral primary osteoarthritis, unspecified knee: Principal | ICD-10-CM | POA: Diagnosis present

## 2013-05-30 DIAGNOSIS — I10 Essential (primary) hypertension: Secondary | ICD-10-CM | POA: Diagnosis present

## 2013-05-30 HISTORY — PX: TOTAL KNEE ARTHROPLASTY: SHX125

## 2013-05-30 LAB — TYPE AND SCREEN
ABO/RH(D): O POS
Antibody Screen: NEGATIVE

## 2013-05-30 SURGERY — ARTHROPLASTY, KNEE, TOTAL
Anesthesia: Spinal | Site: Knee | Laterality: Right

## 2013-05-30 MED ORDER — ASPIRIN EC 325 MG PO TBEC
325.0000 mg | DELAYED_RELEASE_TABLET | Freq: Two times a day (BID) | ORAL | Status: DC
  Administered 2013-05-31: 325 mg via ORAL
  Filled 2013-05-30 (×3): qty 1

## 2013-05-30 MED ORDER — PHENOL 1.4 % MT LIQD: 1.0000 | OROMUCOSAL | Status: DC | PRN

## 2013-05-30 MED ORDER — CLINDAMYCIN PHOSPHATE 900 MG/50ML IV SOLN
900.0000 mg | INTRAVENOUS | Status: AC
  Administered 2013-05-30: 900 mg via INTRAVENOUS

## 2013-05-30 MED ORDER — METHOCARBAMOL 100 MG/ML IJ SOLN
500.0000 mg | Freq: Four times a day (QID) | INTRAMUSCULAR | Status: DC | PRN
  Administered 2013-05-30: 500 mg via INTRAVENOUS
  Filled 2013-05-30: qty 5

## 2013-05-30 MED ORDER — ACETAMINOPHEN 10 MG/ML IV SOLN
1000.0000 mg | Freq: Once | INTRAVENOUS | Status: AC
  Administered 2013-05-30: 1000 mg via INTRAVENOUS
  Filled 2013-05-30: qty 100

## 2013-05-30 MED ORDER — MIDAZOLAM HCL 2 MG/2ML IJ SOLN
INTRAMUSCULAR | Status: AC
  Filled 2013-05-30: qty 2

## 2013-05-30 MED ORDER — HYDROMORPHONE HCL PF 1 MG/ML IJ SOLN: 0.5000 mg | INTRAMUSCULAR | Status: DC | PRN

## 2013-05-30 MED ORDER — METHOCARBAMOL 500 MG PO TABS
500.0000 mg | ORAL_TABLET | Freq: Four times a day (QID) | ORAL | Status: DC | PRN
Start: 2013-05-30 — End: 2013-05-31

## 2013-05-30 MED ORDER — CLINDAMYCIN PHOSPHATE 600 MG/50ML IV SOLN
600.0000 mg | Freq: Four times a day (QID) | INTRAVENOUS | Status: AC
  Administered 2013-05-30 (×2): 600 mg via INTRAVENOUS
  Filled 2013-05-30 (×2): qty 50

## 2013-05-30 MED ORDER — 0.9 % SODIUM CHLORIDE (POUR BTL) OPTIME
TOPICAL | Status: DC | PRN
  Administered 2013-05-30: 1000 mL

## 2013-05-30 MED ORDER — CELECOXIB 200 MG PO CAPS
200.0000 mg | ORAL_CAPSULE | Freq: Two times a day (BID) | ORAL | Status: DC
  Administered 2013-05-30 – 2013-05-31 (×2): 200 mg via ORAL
  Filled 2013-05-30 (×3): qty 1

## 2013-05-30 MED ORDER — PROPOFOL 10 MG/ML IV BOLUS
INTRAVENOUS | Status: AC
  Filled 2013-05-30: qty 20

## 2013-05-30 MED ORDER — DUTASTERIDE 0.5 MG PO CAPS
0.5000 mg | ORAL_CAPSULE | Freq: Every morning | ORAL | Status: DC
  Administered 2013-05-31: 0.5 mg via ORAL
  Filled 2013-05-30: qty 1

## 2013-05-30 MED ORDER — LIDOCAINE HCL (CARDIAC) 20 MG/ML IV SOLN
INTRAVENOUS | Status: AC
  Filled 2013-05-30: qty 5

## 2013-05-30 MED ORDER — METOCLOPRAMIDE HCL 5 MG/ML IJ SOLN: 5.0000 mg | Freq: Three times a day (TID) | INTRAMUSCULAR | Status: DC | PRN

## 2013-05-30 MED ORDER — HYDROMORPHONE HCL PF 1 MG/ML IJ SOLN
INTRAMUSCULAR | Status: DC | PRN
  Administered 2013-05-30 (×4): 0.5 mg via INTRAVENOUS

## 2013-05-30 MED ORDER — BISACODYL 10 MG RE SUPP: 10.0000 mg | Freq: Every day | RECTAL | Status: DC | PRN

## 2013-05-30 MED ORDER — POLYVINYL ALCOHOL 1.4 % OP SOLN
1.0000 [drp] | Freq: Three times a day (TID) | OPHTHALMIC | Status: DC | PRN
  Filled 2013-05-30: qty 15

## 2013-05-30 MED ORDER — FERROUS SULFATE 325 (65 FE) MG PO TABS
325.0000 mg | ORAL_TABLET | Freq: Three times a day (TID) | ORAL | Status: DC
  Administered 2013-05-30 – 2013-05-31 (×3): 325 mg via ORAL
  Filled 2013-05-30 (×5): qty 1

## 2013-05-30 MED ORDER — SODIUM CHLORIDE 0.9 % IJ SOLN
INTRAMUSCULAR | Status: AC
  Filled 2013-05-30: qty 50

## 2013-05-30 MED ORDER — SODIUM CHLORIDE 0.9 % IV SOLN
INTRAVENOUS | Status: DC
  Administered 2013-05-30 – 2013-05-31 (×2): via INTRAVENOUS
  Filled 2013-05-30 (×5): qty 1000

## 2013-05-30 MED ORDER — HYDROMORPHONE HCL PF 1 MG/ML IJ SOLN
0.2500 mg | INTRAMUSCULAR | Status: DC | PRN
  Administered 2013-05-30: 0.5 mg via INTRAVENOUS

## 2013-05-30 MED ORDER — BUPIVACAINE-EPINEPHRINE (PF) 0.25% -1:200000 IJ SOLN
INTRAMUSCULAR | Status: DC | PRN
  Administered 2013-05-30: 25 mL

## 2013-05-30 MED ORDER — SODIUM CHLORIDE 0.9 % IR SOLN
Status: DC | PRN
  Administered 2013-05-30: 1000 mL

## 2013-05-30 MED ORDER — CLINDAMYCIN PHOSPHATE 900 MG/50ML IV SOLN
INTRAVENOUS | Status: AC
  Filled 2013-05-30: qty 50

## 2013-05-30 MED ORDER — DIPHENHYDRAMINE HCL 25 MG PO CAPS: 25.0000 mg | ORAL_CAPSULE | Freq: Four times a day (QID) | ORAL | Status: DC | PRN

## 2013-05-30 MED ORDER — LACTATED RINGERS IV SOLN
INTRAVENOUS | Status: DC | PRN
  Administered 2013-05-30 (×3): via INTRAVENOUS

## 2013-05-30 MED ORDER — PROPOFOL INFUSION 10 MG/ML OPTIME
INTRAVENOUS | Status: DC | PRN
  Administered 2013-05-30: 140 ug/kg/min via INTRAVENOUS

## 2013-05-30 MED ORDER — EPHEDRINE SULFATE 50 MG/ML IJ SOLN
INTRAMUSCULAR | Status: DC | PRN
  Administered 2013-05-30 (×2): 10 mg via INTRAVENOUS

## 2013-05-30 MED ORDER — ALLOPURINOL 300 MG PO TABS
300.0000 mg | ORAL_TABLET | Freq: Every day | ORAL | Status: DC
  Administered 2013-05-31: 300 mg via ORAL
  Filled 2013-05-30: qty 1

## 2013-05-30 MED ORDER — MIDAZOLAM HCL 5 MG/5ML IJ SOLN
INTRAMUSCULAR | Status: DC | PRN
  Administered 2013-05-30 (×2): 1 mg via INTRAVENOUS

## 2013-05-30 MED ORDER — ALUM & MAG HYDROXIDE-SIMETH 200-200-20 MG/5ML PO SUSP: 30.0000 mL | ORAL | Status: DC | PRN

## 2013-05-30 MED ORDER — COLCHICINE 0.6 MG PO TABS
0.6000 mg | ORAL_TABLET | ORAL | Status: DC | PRN
  Filled 2013-05-30: qty 1

## 2013-05-30 MED ORDER — SODIUM CHLORIDE 0.9 % IV SOLN
1000.0000 mg | Freq: Once | INTRAVENOUS | Status: AC
  Administered 2013-05-30: 1000 mg via INTRAVENOUS
  Filled 2013-05-30: qty 10

## 2013-05-30 MED ORDER — ONDANSETRON HCL 4 MG/2ML IJ SOLN
INTRAMUSCULAR | Status: AC
  Filled 2013-05-30: qty 2

## 2013-05-30 MED ORDER — PROMETHAZINE HCL 25 MG/ML IJ SOLN: 6.2500 mg | INTRAMUSCULAR | Status: DC | PRN

## 2013-05-30 MED ORDER — METOCLOPRAMIDE HCL 10 MG PO TABS
5.0000 mg | ORAL_TABLET | Freq: Three times a day (TID) | ORAL | Status: DC | PRN
Start: 2013-05-30 — End: 2013-05-31

## 2013-05-30 MED ORDER — CARBOXYMETHYLCELLULOSE SODIUM 0.5 % OP SOLN: 1.0000 [drp] | Freq: Three times a day (TID) | OPHTHALMIC | Status: DC | PRN

## 2013-05-30 MED ORDER — MAGNESIUM CITRATE PO SOLN: 1.0000 | Freq: Once | ORAL | Status: AC | PRN

## 2013-05-30 MED ORDER — DEXAMETHASONE SODIUM PHOSPHATE 10 MG/ML IJ SOLN
10.0000 mg | Freq: Once | INTRAMUSCULAR | Status: AC
  Administered 2013-05-30: 10 mg via INTRAVENOUS

## 2013-05-30 MED ORDER — TAMSULOSIN HCL 0.4 MG PO CAPS
0.4000 mg | ORAL_CAPSULE | Freq: Every day | ORAL | Status: DC
  Administered 2013-05-31: 0.4 mg via ORAL
  Filled 2013-05-30: qty 1

## 2013-05-30 MED ORDER — HYDROCODONE-ACETAMINOPHEN 7.5-325 MG PO TABS
1.0000 | ORAL_TABLET | ORAL | Status: DC
  Administered 2013-05-30 – 2013-05-31 (×4): 1 via ORAL
  Filled 2013-05-30 (×4): qty 1
  Filled 2013-05-30: qty 2

## 2013-05-30 MED ORDER — BUPIVACAINE LIPOSOME 1.3 % IJ SUSP
20.0000 mL | Freq: Once | INTRAMUSCULAR | Status: AC
  Administered 2013-05-30: 20 mL
  Filled 2013-05-30: qty 20

## 2013-05-30 MED ORDER — METOPROLOL SUCCINATE ER 100 MG PO TB24
100.0000 mg | ORAL_TABLET | ORAL | Status: DC
  Administered 2013-05-31: 100 mg via ORAL
  Filled 2013-05-30: qty 1

## 2013-05-30 MED ORDER — CHLORHEXIDINE GLUCONATE 4 % EX LIQD: 60.0000 mL | Freq: Once | CUTANEOUS | Status: DC

## 2013-05-30 MED ORDER — HYDROMORPHONE HCL PF 2 MG/ML IJ SOLN
INTRAMUSCULAR | Status: AC
  Filled 2013-05-30: qty 1

## 2013-05-30 MED ORDER — HYDROMORPHONE HCL PF 1 MG/ML IJ SOLN
INTRAMUSCULAR | Status: AC
  Filled 2013-05-30: qty 1

## 2013-05-30 MED ORDER — SODIUM CHLORIDE 0.9 % IJ SOLN
INTRAMUSCULAR | Status: DC | PRN
  Administered 2013-05-30: 14 mL

## 2013-05-30 MED ORDER — SODIUM CHLORIDE 0.9 % IJ SOLN
INTRAMUSCULAR | Status: AC
  Filled 2013-05-30: qty 10

## 2013-05-30 MED ORDER — FENTANYL CITRATE 0.05 MG/ML IJ SOLN
INTRAMUSCULAR | Status: AC
  Filled 2013-05-30: qty 2

## 2013-05-30 MED ORDER — DEXAMETHASONE SODIUM PHOSPHATE 10 MG/ML IJ SOLN
10.0000 mg | Freq: Once | INTRAMUSCULAR | Status: AC
  Administered 2013-05-31: 10 mg via INTRAVENOUS
  Filled 2013-05-30: qty 1

## 2013-05-30 MED ORDER — FENTANYL CITRATE 0.05 MG/ML IJ SOLN
INTRAMUSCULAR | Status: DC | PRN
  Administered 2013-05-30 (×2): 50 ug via INTRAVENOUS

## 2013-05-30 MED ORDER — DOCUSATE SODIUM 100 MG PO CAPS
100.0000 mg | ORAL_CAPSULE | Freq: Two times a day (BID) | ORAL | Status: DC
  Administered 2013-05-30 – 2013-05-31 (×2): 100 mg via ORAL

## 2013-05-30 MED ORDER — LORATADINE 10 MG PO TABS
10.0000 mg | ORAL_TABLET | Freq: Every day | ORAL | Status: DC | PRN
  Filled 2013-05-30: qty 1

## 2013-05-30 MED ORDER — ONDANSETRON HCL 4 MG PO TABS: 4.0000 mg | ORAL_TABLET | Freq: Four times a day (QID) | ORAL | Status: DC | PRN

## 2013-05-30 MED ORDER — DEXAMETHASONE SODIUM PHOSPHATE 10 MG/ML IJ SOLN
INTRAMUSCULAR | Status: AC
  Filled 2013-05-30: qty 1

## 2013-05-30 MED ORDER — KETOROLAC TROMETHAMINE 30 MG/ML IJ SOLN
INTRAMUSCULAR | Status: AC
  Filled 2013-05-30: qty 1

## 2013-05-30 MED ORDER — ONDANSETRON HCL 4 MG/2ML IJ SOLN
4.0000 mg | Freq: Four times a day (QID) | INTRAMUSCULAR | Status: DC | PRN
Start: 2013-05-30 — End: 2013-05-31

## 2013-05-30 MED ORDER — POLYETHYLENE GLYCOL 3350 17 G PO PACK
17.0000 g | PACK | Freq: Two times a day (BID) | ORAL | Status: DC
  Administered 2013-05-30 – 2013-05-31 (×2): 17 g via ORAL

## 2013-05-30 MED ORDER — ONDANSETRON HCL 4 MG/2ML IJ SOLN
INTRAMUSCULAR | Status: DC | PRN
  Administered 2013-05-30: 4 mg via INTRAVENOUS

## 2013-05-30 MED ORDER — BUPIVACAINE-EPINEPHRINE (PF) 0.25% -1:200000 IJ SOLN
INTRAMUSCULAR | Status: AC
  Filled 2013-05-30: qty 30

## 2013-05-30 MED ORDER — MENTHOL 3 MG MT LOZG
1.0000 | LOZENGE | OROMUCOSAL | Status: DC | PRN
  Filled 2013-05-30: qty 9

## 2013-05-30 MED ORDER — KETOROLAC TROMETHAMINE 30 MG/ML IJ SOLN
INTRAMUSCULAR | Status: DC | PRN
  Administered 2013-05-30: 30 mg

## 2013-05-30 SURGICAL SUPPLY — 61 items
ADH SKN CLS APL DERMABOND .7 (GAUZE/BANDAGES/DRESSINGS) ×1
BAG SPEC THK2 15X12 ZIP CLS (MISCELLANEOUS) ×1
BAG ZIPLOCK 12X15 (MISCELLANEOUS) ×3 IMPLANT
BANDAGE ELASTIC 6 VELCRO ST LF (GAUZE/BANDAGES/DRESSINGS) ×3 IMPLANT
BANDAGE ESMARK 6X9 LF (GAUZE/BANDAGES/DRESSINGS) ×1 IMPLANT
BLADE SAW SGTL 13.0X1.19X90.0M (BLADE) ×3 IMPLANT
BNDG CMPR 9X6 STRL LF SNTH (GAUZE/BANDAGES/DRESSINGS) ×1
BNDG ESMARK 6X9 LF (GAUZE/BANDAGES/DRESSINGS) ×3
BOWL SMART MIX CTS (DISPOSABLE) ×3 IMPLANT
CAPT RP KNEE ×2 IMPLANT
CEMENT HV SMART SET (Cement) ×4 IMPLANT
CUFF TOURN SGL QUICK 34 (TOURNIQUET CUFF) ×3
CUFF TRNQT CYL 34X4X40X1 (TOURNIQUET CUFF) ×1 IMPLANT
DECANTER SPIKE VIAL GLASS SM (MISCELLANEOUS) ×3 IMPLANT
DERMABOND ADVANCED (GAUZE/BANDAGES/DRESSINGS) ×2
DERMABOND ADVANCED .7 DNX12 (GAUZE/BANDAGES/DRESSINGS) ×1 IMPLANT
DRAPE EXTREMITY T 121X128X90 (DRAPE) ×3 IMPLANT
DRAPE POUCH INSTRU U-SHP 10X18 (DRAPES) ×3 IMPLANT
DRAPE U-SHAPE 47X51 STRL (DRAPES) ×3 IMPLANT
DRSG AQUACEL AG ADV 3.5X10 (GAUZE/BANDAGES/DRESSINGS) ×3 IMPLANT
DRSG TEGADERM 4X4.75 (GAUZE/BANDAGES/DRESSINGS) IMPLANT
DURAPREP 26ML APPLICATOR (WOUND CARE) ×6 IMPLANT
ELECT REM PT RETURN 9FT ADLT (ELECTROSURGICAL) ×3
ELECTRODE REM PT RTRN 9FT ADLT (ELECTROSURGICAL) ×1 IMPLANT
EVACUATOR 1/8 PVC DRAIN (DRAIN) IMPLANT
FACESHIELD WRAPAROUND (MASK) ×15 IMPLANT
FACESHIELD WRAPAROUND OR TEAM (MASK) ×5 IMPLANT
GAUZE SPONGE 2X2 8PLY STRL LF (GAUZE/BANDAGES/DRESSINGS) IMPLANT
GLOVE BIOGEL PI IND STRL 7.5 (GLOVE) ×1 IMPLANT
GLOVE BIOGEL PI IND STRL 8 (GLOVE) ×1 IMPLANT
GLOVE BIOGEL PI INDICATOR 7.5 (GLOVE) ×2
GLOVE BIOGEL PI INDICATOR 8 (GLOVE) ×2
GLOVE ECLIPSE 8.0 STRL XLNG CF (GLOVE) ×3 IMPLANT
GLOVE ORTHO TXT STRL SZ7.5 (GLOVE) ×6 IMPLANT
GLOVE SURG SS PI 7.5 STRL IVOR (GLOVE) ×4 IMPLANT
GOWN SPEC L3 XXLG W/TWL (GOWN DISPOSABLE) ×5 IMPLANT
GOWN STRL REUS W/TWL LRG LVL3 (GOWN DISPOSABLE) ×5 IMPLANT
HANDPIECE INTERPULSE COAX TIP (DISPOSABLE) ×3
KIT BASIN OR (CUSTOM PROCEDURE TRAY) ×3 IMPLANT
MANIFOLD NEPTUNE II (INSTRUMENTS) ×3 IMPLANT
NDL SAFETY ECLIPSE 18X1.5 (NEEDLE) ×1 IMPLANT
NEEDLE HYPO 18GX1.5 SHARP (NEEDLE) ×3
NS IRRIG 1000ML POUR BTL (IV SOLUTION) ×3 IMPLANT
PACK TOTAL JOINT (CUSTOM PROCEDURE TRAY) ×3 IMPLANT
POSITIONER SURGICAL ARM (MISCELLANEOUS) ×3 IMPLANT
SET HNDPC FAN SPRY TIP SCT (DISPOSABLE) ×1 IMPLANT
SET PAD KNEE POSITIONER (MISCELLANEOUS) ×3 IMPLANT
SPONGE GAUZE 2X2 STER 10/PKG (GAUZE/BANDAGES/DRESSINGS)
SUCTION FRAZIER 12FR DISP (SUCTIONS) ×3 IMPLANT
SUT MNCRL AB 4-0 PS2 18 (SUTURE) ×3 IMPLANT
SUT VIC AB 1 CT1 36 (SUTURE) ×3 IMPLANT
SUT VIC AB 2-0 CT1 27 (SUTURE) ×9
SUT VIC AB 2-0 CT1 TAPERPNT 27 (SUTURE) ×3 IMPLANT
SUT VLOC 180 0 24IN GS25 (SUTURE) ×3 IMPLANT
SYR 50ML LL SCALE MARK (SYRINGE) ×3 IMPLANT
TOWEL OR 17X26 10 PK STRL BLUE (TOWEL DISPOSABLE) ×3 IMPLANT
TOWEL OR NON WOVEN STRL DISP B (DISPOSABLE) ×2 IMPLANT
TRAY FOLEY CATH 14FRSI W/METER (CATHETERS) ×1 IMPLANT
TRAY FOLEY CATH 16FRSI W/METER (SET/KITS/TRAYS/PACK) ×2 IMPLANT
WATER STERILE IRR 1500ML POUR (IV SOLUTION) ×3 IMPLANT
WRAP KNEE MAXI GEL POST OP (GAUZE/BANDAGES/DRESSINGS) ×3 IMPLANT

## 2013-05-30 NOTE — Progress Notes (Signed)
Utilization review completed.  

## 2013-05-30 NOTE — Plan of Care (Signed)
Problem: Consults Goal: Diagnosis- Total Joint Replacement Primary Total Knee     

## 2013-05-30 NOTE — Interval H&P Note (Signed)
History and Physical Interval Note:  05/30/2013 8:31 AM  Pedro Lee  has presented today for surgery, with the diagnosis of RIGHT KNEE OA  The various methods of treatment have been discussed with the patient and family. After consideration of risks, benefits and other options for treatment, the patient has consented to  Procedure(s): RIGHT TOTAL KNEE ARTHROPLASTY (Right) as a surgical intervention .  The patient's history has been reviewed, patient examined, no change in status, stable for surgery.  I have reviewed the patient's chart and labs.  Questions were answered to the patient's satisfaction.     Mauri Pole

## 2013-05-30 NOTE — Progress Notes (Signed)
PT Cancellation Note  Patient Details Name: Pedro Lee MRN: [id number] DOB: 1940/07/02   Cancelled Treatment:    Reason Eval/Treat Not Completed: Medical issues which prohibited therapy (spinal has not worn off yet). Will follow.    Lucile Crater 05/30/2013, 3:38 PM [phone]

## 2013-05-30 NOTE — Transfer of Care (Signed)
Immediate Anesthesia Transfer of Care Note  Patient: Pedro Lee  Procedure(s) Performed: Procedure(s): RIGHT TOTAL KNEE ARTHROPLASTY (Right)  Patient Location: PACU  Anesthesia Type:Spinal  Level of Consciousness: sedated  Airway & Oxygen Therapy: Patient Spontanous Breathing and Patient connected to nasal cannula oxygen  Post-op Assessment: Report given to PACU RN and Post -op Vital signs reviewed and stable  Post vital signs: Reviewed and stable  Complications: No apparent anesthesia complications

## 2013-05-30 NOTE — Anesthesia Postprocedure Evaluation (Signed)
  Anesthesia Post-op Note  Patient: Pedro Lee  Procedure(s) Performed: Procedure(s) (LRB): RIGHT TOTAL KNEE ARTHROPLASTY (Right)  Patient Location: PACU  Anesthesia Type: Spinal  Level of Consciousness: awake and alert   Airway and Oxygen Therapy: Patient Spontanous Breathing  Post-op Pain: mild  Post-op Assessment: Post-op Vital signs reviewed, Patient's Cardiovascular Status Stable, Respiratory Function Stable, Patent Airway and No signs of Nausea or vomiting  Last Vitals:  Filed Vitals:   05/30/13 1300  BP: 124/56  Pulse: 61  Temp:   Resp: 16    Post-op Vital Signs: stable   Complications: No apparent anesthesia complications

## 2013-05-30 NOTE — Op Note (Addendum)
NAME:  Pedro Lee                      MEDICAL RECORD NO.:  [id number]                             FACILITY:  [facility]      PHYSICIAN:  Pietro Cassis. [name], M.D.  DATE OF BIRTH:  [birth year]      DATE OF PROCEDURE:  05/30/2013                                     OPERATIVE REPORT         PREOPERATIVE DIAGNOSIS:  Right knee osteoarthritis.      POSTOPERATIVE DIAGNOSIS:  Right knee osteoarthritis.      FINDINGS:  The patient was noted to have complete loss of cartilage and   bone-on-bone arthritis with associated osteophytes in the medial and patellofemoral compartments of   the knee with a significant synovitis and associated effusion.      PROCEDURE:  Right total knee replacement.      COMPONENTS USED:  DePuy rotating platform posterior stabilized knee   system, a size 5 femur, 5 tibia, 12.5 mm PS insert, and 41 patellar   button.      SURGEON:  Pietro Cassis. [name], M.D.      ASSISTANT:  Molli Barrows, PA-C.      ANESTHESIA:  Spinal.      SPECIMENS:  None.      COMPLICATION:  None.      DRAINS:  None.  EBL: <100cc      TOURNIQUET TIME:   Total Tourniquet Time Documented: Thigh (Right) - 39 minutes Total: Thigh (Right) - 39 minutes  .      The patient was stable to the recovery room.      INDICATION FOR PROCEDURE:  [name] is a 73 y.o. male patient of   mine.  The patient had been seen, evaluated, and treated conservatively in the   office with medication, activity modification, and injections.  The patient had   radiographic changes of bone-on-bone arthritis with endplate sclerosis and osteophytes noted.      The patient failed conservative measures including medication, injections, and activity modification, and at this point was ready for more definitive measures.   Based on the radiographic changes and failed conservative measures, the patient   decided to proceed with total knee replacement.  Risks of infection,   DVT, component failure, need for revision  surgery, postop course, and   expectations were all   discussed and reviewed.  Consent was obtained for benefit of pain   relief.      PROCEDURE IN DETAIL:  The patient was brought to the operative theater.   Once adequate anesthesia, preoperative antibiotics, 2 gm of Ancef administered, the patient was positioned supine with the right thigh tourniquet placed.  The  right lower extremity was prepped and draped in sterile fashion.  A time-   out was performed identifying the patient, planned procedure, and   extremity.      The right lower extremity was placed in the [facility] leg holder.  The leg was   exsanguinated, tourniquet elevated to 250 mmHg.  A midline incision was   made followed by median parapatellar arthrotomy.  Following initial   exposure, attention was first  directed to the patella.  Precut   measurement was noted to be 26 mm.  I resected down to 14-15 mm and used a   41 patellar button to restore patellar height as well as cover the cut   surface.      The lug holes were drilled and a metal shim was placed to protect the   patella from retractors and saw blades.      At this point, attention was now directed to the femur.  The femoral   canal was opened with a drill, irrigated to try to prevent fat emboli.  An   intramedullary rod was passed at 5 degrees valgus, 10 mm of bone was   resected off the distal femur.  Following this resection, the tibia was   subluxated anteriorly.  Using the extramedullary guide, 10 mm of bone was resected off   the proximal lateral tibia.  We confirmed the gap would be   stable medially and laterally with a 10 mm insert as well as confirmed   the cut was perpendicular in the coronal plane, checking with an alignment rod.      Once this was done, I sized the femur to be a size 5 in the anterior-   posterior dimension, chose a standard component based on medial and   lateral dimension.  The size 5 rotation block was then pinned in   position  anterior referenced using the C-clamp to set rotation.  The   anterior, posterior, and  chamfer cuts were made without difficulty nor   notching making certain that I was along the anterior cortex to help   with flexion gap stability.      The final box cut was made off the lateral aspect of distal femur.      At this point, the tibia was sized to be a size 5, the size 5 tray was   then pinned in position through the medial third of the tubercle,   drilled, and keel punched.  Trial reduction was now carried with a 5 femur,  5 tibia, a 12.5 mm insert, and the 41 patella botton.  The knee was brought to   extension, full extension with good flexion stability with the patella   tracking through the trochlea without application of pressure.  Given   all these findings, the trial components removed.  Final components were   opened and cement was mixed.  The knee was irrigated with normal saline   solution and pulse lavage.  The synovial lining was   then injected with 20cc of Exparel, 25cc of 0.25% Marcaine with epinephrine and 1 cc of Toradol,   total of 61 cc.      The knee was irrigated.  Final implants were then cemented onto clean and   dried cut surfaces of bone with the knee brought to extension with a 12.5 mm trial insert.      Once the cement had fully cured, the excess cement was removed   throughout the knee.  I confirmed I was satisfied with the range of   motion and stability, and the final 12.5 mm PS insert was chosen.  It was   placed into the knee.      The tourniquet had been let down at 39 minutes.  No significant   hemostasis required.  The   extensor mechanism was then reapproximated using #1 Vicryl and #0 V-lock with the knee   in flexion.  The  remaining wound was closed with 2-0 Vicryl and running 4-0 Monocryl.   The knee was cleaned, dried, dressed sterilely using Dermabond and   Aquacel dressing.  The patient was then   brought to recovery room in stable  condition, tolerating the procedure   well.   Please note that Physician Assistant, Molli Barrows, PA-C, was present for the entirety of the case, and was utilized for pre-operative positioning, peri-operative retractor management, general facilitation of the procedure.  He was also utilized for primary wound closure at the end of the case.              Pietro Cassis [name], M.D.    05/30/2013 12:52 PM

## 2013-05-30 NOTE — Anesthesia Procedure Notes (Signed)
Spinal  Patient location during procedure: OR Staffing Performed by: anesthesiologist  Preanesthetic Checklist Completed: patient identified, site marked, surgical consent, pre-op evaluation, timeout performed, IV checked, risks and benefits discussed and monitors and equipment checked Spinal Block Patient position: sitting Prep: Betadine Patient monitoring: heart rate, continuous pulse ox and blood pressure Injection technique: single-shot Needle Needle type: Sprotte  Needle gauge: 24 G Needle length: 9 cm Additional Notes Expiration date of kit checked and confirmed. Patient tolerated procedure well, without complications.

## 2013-05-30 NOTE — Anesthesia Preprocedure Evaluation (Signed)
Anesthesia Evaluation  Patient identified by MRN, date of birth, ID band Patient awake    Reviewed: Allergy & Precautions, H&P , NPO status , Patient's Chart, lab work & pertinent test results  Airway Mallampati: III TM Distance: <3 FB Neck ROM: Full    Dental no notable dental hx.    Pulmonary sleep apnea ,  breath sounds clear to auscultation  + decreased breath sounds      Cardiovascular hypertension, Pt. on medications and Pt. on home beta blockers Rhythm:Regular Rate:Normal     Neuro/Psych negative neurological ROS  negative psych ROS   GI/Hepatic negative GI ROS, Neg liver ROS,   Endo/Other  Morbid obesity  Renal/GU negative Renal ROS  negative genitourinary   Musculoskeletal negative musculoskeletal ROS (+)   Abdominal   Peds negative pediatric ROS (+)  Hematology negative hematology ROS (+)   Anesthesia Other Findings   Reproductive/Obstetrics negative OB ROS                           Anesthesia Physical Anesthesia Plan  ASA: III  Anesthesia Plan: Spinal   Post-op Pain Management:    Induction: Intravenous  Airway Management Planned: Simple Face Mask  Additional Equipment:   Intra-op Plan:   Post-operative Plan:   Informed Consent: I have reviewed the patients History and Physical, chart, labs and discussed the procedure including the risks, benefits and alternatives for the proposed anesthesia with the patient or authorized representative who has indicated his/her understanding and acceptance.   Dental advisory given  Plan Discussed with: CRNA and Surgeon  Anesthesia Plan Comments:         Anesthesia Quick Evaluation

## 2013-05-31 DIAGNOSIS — D500 Iron deficiency anemia secondary to blood loss (chronic): Secondary | ICD-10-CM | POA: Diagnosis not present

## 2013-05-31 DIAGNOSIS — E669 Obesity, unspecified: Secondary | ICD-10-CM | POA: Diagnosis present

## 2013-05-31 LAB — BASIC METABOLIC PANEL
BUN: 17 mg/dL (ref 6–23)
CHLORIDE: 101 meq/L (ref 96–112)
CO2: 27 meq/L (ref 19–32)
Calcium: 8.5 mg/dL (ref 8.4–10.5)
Creatinine, Ser: 1.27 mg/dL (ref 0.50–1.35)
GFR calc Af Amer: 63 mL/min — ABNORMAL LOW (ref >90)
GFR calc non Af Amer: 54 mL/min — ABNORMAL LOW (ref >90)
GLUCOSE: 117 mg/dL — AB (ref 70–99)
POTASSIUM: 4.4 meq/L (ref 3.7–5.3)
SODIUM: 138 meq/L (ref 137–147)

## 2013-05-31 LAB — CBC
HCT: 34.8 % — ABNORMAL LOW (ref 39.0–52.0)
HEMOGLOBIN: 11 g/dL — AB (ref 13.0–17.0)
MCH: 27.3 pg (ref 26.0–34.0)
MCHC: 31.6 g/dL (ref 30.0–36.0)
MCV: 86.4 fL (ref 78.0–100.0)
PLATELETS: 165 10*3/uL (ref 150–400)
RBC: 4.03 MIL/uL — AB (ref 4.22–5.81)
RDW: 14.3 % (ref 11.5–15.5)
WBC: 12.2 10*3/uL — AB (ref 4.0–10.5)

## 2013-05-31 MED ORDER — FERROUS SULFATE 325 (65 FE) MG PO TABS: 325.0000 mg | ORAL_TABLET | Freq: Three times a day (TID) | ORAL | Status: DC

## 2013-05-31 MED ORDER — TIZANIDINE HCL 4 MG PO CAPS: 4.0000 mg | ORAL_CAPSULE | Freq: Three times a day (TID) | ORAL | Status: DC | PRN

## 2013-05-31 MED ORDER — HYDROCODONE-ACETAMINOPHEN 7.5-325 MG PO TABS: 1.0000 | ORAL_TABLET | ORAL | Status: DC | PRN

## 2013-05-31 MED ORDER — DSS 100 MG PO CAPS: 100.0000 mg | ORAL_CAPSULE | Freq: Two times a day (BID) | ORAL | Status: DC

## 2013-05-31 MED ORDER — ASPIRIN 325 MG PO TBEC: 325.0000 mg | DELAYED_RELEASE_TABLET | Freq: Two times a day (BID) | ORAL | Status: AC

## 2013-05-31 MED ORDER — POLYETHYLENE GLYCOL 3350 17 G PO PACK: 17.0000 g | PACK | Freq: Two times a day (BID) | ORAL | Status: DC

## 2013-05-31 NOTE — Progress Notes (Signed)
   Subjective: 1 Day Post-Op Procedure(s) (LRB): RIGHT TOTAL KNEE ARTHROPLASTY (Right)   Patient reports pain as mild, pain controlled. No events throughout the night. Denies any CP or SOB. Ready to be discharged home if pain stays controlled and does well with PT.  Objective:   VITALS:   Filed Vitals:   05/31/13 0904  BP: 132/70  Pulse: 65  Temp: 97.8 F (36.6 C)   Resp: 16    Neurovascular intact Dorsiflexion/Plantar flexion intact Incision: dressing C/D/I No cellulitis present Compartment soft  LABS  Recent Labs  05/31/13 0412  HGB 11.0*  HCT 34.8*  WBC 12.2*  PLT 165     Recent Labs  05/31/13 0412  NA 138  K 4.4  BUN 17  CREATININE 1.27  GLUCOSE 117*     Assessment/Plan: 1 Day Post-Op Procedure(s) (LRB): RIGHT TOTAL KNEE ARTHROPLASTY (Right) Foley cath d/c'ed Advance diet Up with therapy D/C IV fluids Discharge home with home health Follow up in 2 weeks at [facility]. Follow up with OLIN,Jacqulin Brandenburger D in 2 weeks.  Contact information:  [facility] [address], [place] [phone]    Expected ABLA  Treated with iron and will observe  Obese (BMI 30-39.9) Estimated body mass index is 35.57 kg/(m^2) as calculated from the following:   Height as of this encounter: 6\' 5"  (1.956 m).   Weight as of this encounter: 136.079 kg (300 lb). Patient also counseled that weight may inhibit the healing process Patient counseled that losing weight will help with future health issues      West Pugh. Oluwaferanmi Wain   PAC  05/31/2013, 10:45 AM

## 2013-05-31 NOTE — Evaluation (Signed)
Occupational Therapy Evaluation Patient Details Name: Pedro Lee MRN: [id number] DOB: 21-Jan-1941 Today's Date: 05/31/2013    History of Present Illness R TKA   Clinical Impression   Pt doing well. All education completed for OT. Has assist at home by wife. No DME needs.    Follow Up Recommendations  No OT follow up    Equipment Recommendations  None recommended by OT    Recommendations for Other Services       Precautions / Restrictions Precautions Precautions: Knee;Fall Restrictions Weight Bearing Restrictions: No      Mobility Bed Mobility Overal bed mobility: Needs Assistance Bed Mobility: Supine to Sit     Supine to sit: Min guard        Transfers Overall transfer level: Needs assistance Equipment used: Rolling walker (2 wheeled) Transfers: Sit to/from Stand Sit to Stand: Min guard         General transfer comment: verbal cues hand placement.    Balance                                            ADL Overall ADL's : Needs assistance/impaired Eating/Feeding: Independent;Sitting   Grooming: Wash/dry hands;Set up;Sitting   Upper Body Bathing: Set up;Sitting   Lower Body Bathing: Minimal assistance Lower Body Bathing Details (indicate cue type and reason): for R foot Upper Body Dressing : Set up;Sitting   Lower Body Dressing: Minimal assistance;Sit to/from stand Lower Body Dressing Details (indicate cue type and reason): start clothing over R foot Toilet Transfer: Min guard;RW;Ambulation   Toileting- Clothing Manipulation and Hygiene: Min guard;Sit to/from stand   Tub/ Shower Transfer: Minimal Museum/gallery conservator Details (indicate cue type and reason): simulate step back over shower ledge with walker and step out forward with walker. Educated on proper sequence and how wife is to assist.    General ADL Comments: Discussed 3in1 option but pt states his toilet is high at home with a grab bar on one side and a  vanity on the other. He declines needing to get a 3in1. He did well with 3in1 placed over toilet for height increased but using grab bar for support on wall similar to home. Pt states wife can assist with LB self care. Discussed use of a shower seat and where to obtain but pt feel he will be fine to stand in the shower. Educated on making sure he feels like he has energy for shower and pain managed before showering. He was able to simulate reaching down to wash lower legs in standing without UE support with no LOB.     Vision                     Perception     Praxis      Pertinent Vitals/Pain No complaint of     Hand Dominance Right   Extremity/Trunk Assessment Upper Extremity Assessment Upper Extremity Assessment: Overall WFL for tasks assessed          Communication Communication Communication: No difficulties   Cognition Arousal/Alertness: Awake/alert Behavior During Therapy: WFL for tasks assessed/performed Overall Cognitive Status: Within Functional Limits for tasks assessed                     General Comments          Shoulder Instructions      Home Living Family/patient  expects to be discharged to:: Private residence Living Arrangements: Spouse/significant other Available Help at Discharge: Family Type of Home: House Home Access: Stairs to enter Technical brewer of Steps: 2 Entrance Stairs-Rails: None Home Layout: One level     Bathroom Shower/Tub: Occupational psychologist: Handicapped height     [place]: Crutches;Grab bars - toilet          Prior Functioning/Environment Level of Independence: Independent             OT Diagnosis:     OT Problem List:     OT Treatment/Interventions:      OT Goals(Current goals can be found in the care plan section) Acute Rehab OT Goals Patient Stated Goal: Resume previous lifestyle with decreased pain  OT Frequency:     Barriers to D/C:             Co-evaluation              End of Session Equipment Utilized During Treatment: Gait belt;Rolling walker  Activity Tolerance: Patient tolerated treatment well Patient left: in chair;with call bell/phone within reach   Time: 0924-0946 OT Time Calculation (min): 22 min Charges:  OT General Charges $OT Visit: 1 Procedure OT Evaluation $Initial OT Evaluation Tier I: 1 Procedure OT Treatments $Therapeutic Activity: 8-22 mins G-Codes:    Alycia Patten Zakkiyya Barno [phone] 05/31/2013, 11:40 AM

## 2013-05-31 NOTE — Progress Notes (Signed)
[facility]  [facility] is providing the following services: RW (patient declined commode)  If patient discharges after hours, please call [phone].   Linward Headland 05/31/2013, 10:35 AM

## 2013-05-31 NOTE — Evaluation (Signed)
Physical Therapy Evaluation Patient Details Name: Pedro Lee MRN: [id number] DOB: 03/30/1940 Today's Date: 05/31/2013   History of Present Illness     Clinical Impression  Pt s/p R TKR presents with decreased R LE strength/ROM and post op discomfort limiting functional mobility.  Pt should progress well to d./c home with family assist and HHPT follow up.    Follow Up Recommendations Home health PT    Equipment Recommendations  Rolling walker with 5" wheels (Pt is 300lbs and 6'5)    Recommendations for Other Services OT consult     Precautions / Restrictions Precautions Precautions: Knee;Fall Restrictions Weight Bearing Restrictions: No      Mobility  Bed Mobility Overal bed mobility: Needs Assistance Bed Mobility: Supine to Sit     Supine to sit: Min guard        Transfers Overall transfer level: Needs assistance Equipment used: Rolling walker (2 wheeled) Transfers: Sit to/from Stand Sit to Stand: Min guard         General transfer comment: cues for LE management and use of UEs to self assist  Ambulation/Gait Ambulation/Gait assistance: Min assist;Min guard Ambulation Distance (Feet): 100 Feet Assistive device: Rolling walker (2 wheeled) Gait Pattern/deviations: Step-to pattern;Step-through pattern;Shuffle;Trunk flexed     General Gait Details: cues for posture and position from [employer]            Wheelchair Mobility    Modified Rankin (Stroke Patients Only)       Balance                                             Pertinent Vitals/Pain Min c/o pain; premed, cold packs provided    Home Living Family/patient expects to be discharged to:: Private residence Living Arrangements: Spouse/significant other Available Help at Discharge: Family Type of Home: House Home Access: Stairs to enter Entrance Stairs-Rails: None Technical brewer of Steps: 2 Home Layout: One level Home Equipment: Crutches      Prior  Function Level of Independence: Independent               Hand Dominance   Dominant Hand: Right    Extremity/Trunk Assessment   Upper Extremity Assessment: Overall WFL for tasks assessed           Lower Extremity Assessment: RLE deficits/detail RLE Deficits / Details: 3/5 quads with AAROM at knee -5 - 80       Communication   Communication: No difficulties  Cognition Arousal/Alertness: Awake/alert Behavior During Therapy: WFL for tasks assessed/performed Overall Cognitive Status: Within Functional Limits for tasks assessed                      General Comments      Exercises Total Joint Exercises Ankle Circles/Pumps: AROM;15 reps;Supine;Both Quad Sets: AROM;Both;Supine;10 reps Heel Slides: AAROM;Right;10 reps;Supine Straight Leg Raises: AAROM;AROM;Right;15 reps;Supine      Assessment/Plan    PT Assessment Patient needs continued PT services  PT Diagnosis Difficulty walking   PT Problem List Decreased strength;Decreased range of motion;Decreased activity tolerance;Decreased mobility;Decreased knowledge of use of DME;Pain  PT Treatment Interventions DME instruction;Gait training;Stair training;Functional mobility training;Therapeutic activities;Therapeutic exercise;Patient/family education   PT Goals (Current goals can be found in the Care Plan section) Acute Rehab PT Goals Patient Stated Goal: Resume previous lifestyle with decreased pain PT Goal Formulation: With patient Time For Goal Achievement:  06/03/13 Potential to Achieve Goals: Good    Frequency 7X/week   Barriers to discharge        Co-evaluation               End of Session Equipment Utilized During Treatment: Gait belt Activity Tolerance: Patient tolerated treatment well Patient left: in chair;with call bell/phone within reach Nurse Communication: Mobility status         Time: 1884-1660 PT Time Calculation (min): 31 min   Charges:   PT Evaluation $Initial PT  Evaluation Tier I: 1 Procedure PT Treatments $Gait Training: 8-22 mins $Therapeutic Exercise: 8-22 mins   PT G Codes:          Mathis Fare 05/31/2013, 8:53 AM

## 2013-05-31 NOTE — Progress Notes (Signed)
Physical Therapy Treatment Patient Details Name: NATE COMMON MRN: [id number] DOB: Dec 19, 1940 Today's Date: 06-08-13    History of Present Illness R TKA    PT Comments    Pt progressing well and with min difficulty on stairs  Follow Up Recommendations  Home health PT     Equipment Recommendations  Rolling walker with 5" wheels    Recommendations for Other Services OT consult     Precautions / Restrictions Precautions Precautions: Knee;Fall Restrictions Weight Bearing Restrictions: No    Mobility  Bed Mobility Overal bed mobility: Needs Assistance Bed Mobility: Supine to Sit     Supine to sit: Min guard        Transfers Overall transfer level: Needs assistance Equipment used: Rolling walker (2 wheeled) Transfers: Sit to/from Stand Sit to Stand: Min guard;Supervision         General transfer comment: cues for LE management and use of UEs to self assist  Ambulation/Gait Ambulation/Gait assistance: Min guard;Supervision Ambulation Distance (Feet): 150 Feet (twice) Assistive device: Rolling walker (2 wheeled) Gait Pattern/deviations: Step-to pattern;Step-through pattern     General Gait Details: cues for posture and position from RW   Stairs Stairs: Yes Stairs assistance: Min assist Stair Management: No rails;Step to pattern;Backwards;With walker Number of Stairs: 4 General stair comments: cues for walker/foot placement and sequence  Wheelchair Mobility    Modified Rankin (Stroke Patients Only)       Balance                                    Cognition Arousal/Alertness: Awake/alert Behavior During Therapy: WFL for tasks assessed/performed Overall Cognitive Status: Within Functional Limits for tasks assessed                      Exercises Total Joint Exercises Ankle Circles/Pumps: AROM;15 reps;Supine;Both Quad Sets: AROM;Both;Supine;10 reps Heel Slides: AAROM;Right;10 reps;Supine Straight Leg Raises:  AAROM;AROM;Right;15 reps;Supine    General Comments        Pertinent Vitals/Pain 4/10; ice packs provided    Home Living Family/patient expects to be discharged to:: Private residence Living Arrangements: Spouse/significant other Available Help at Discharge: Family Type of Home: House Home Access: Stairs to enter Entrance Stairs-Rails: None Home Layout: One level Home Equipment: Crutches      Prior Function Level of Independence: Independent          PT Goals (current goals can now be found in the care plan section) Acute Rehab PT Goals Patient Stated Goal: Resume previous lifestyle with decreased pain PT Goal Formulation: With patient Time For Goal Achievement: 06/03/13 Potential to Achieve Goals: Good Progress towards PT goals: Progressing toward goals    Frequency  7X/week    PT Plan Current plan remains appropriate    Co-evaluation             End of Session Equipment Utilized During Treatment: Gait belt Activity Tolerance: Patient tolerated treatment well Patient left: in chair;with call bell/phone within reach     Time: 1111-1127 PT Time Calculation (min): 16 min  Charges:  $Gait Training: 8-22 mins $Therapeutic Exercise: 8-22 mins                    G Codes:      Mathis Fare 06/08/13, 11:32 AM

## 2013-06-01 NOTE — Discharge Summary (Signed)
Physician Discharge Summary  Patient ID: Pedro Lee MRN: [id number] DOB/AGE: [birth year] 73 y.o.  Admit date: 05/30/2013 Discharge date: 05/31/2013   Procedures:  Procedure(s) (LRB): RIGHT TOTAL KNEE ARTHROPLASTY (Right)  Attending Physician:  Dr. Paralee Cancel   Admission Diagnoses:   Right knee OA / pain  Discharge Diagnoses:  Principal Problem:   S/P right TKA Active Problems:   Expected blood loss anemia   Obese  Past Medical History  Diagnosis Date  . Essential hypertension, malignant   . Osteoarthrosis, unspecified whether generalized or localized, unspecified site   . Other dyspnea and respiratory abnormality   . Pure hypercholesterolemia   . Gout, unspecified   . Osteoarthritis   . Obesity, mild   . Allergy   . OSA (obstructive sleep apnea)     Mild - PT STATES HE DID NOT HAVE TO USE CPAP  . 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  Marland Kitchen Renal insufficiency, mild     PT STATES NOT AWARE OF ANY KIDNEY PROBLEM  . Complication of anesthesia     HX OF ENLARGED PROSTATE AND UNABLE TO PASS WATER AFTER ANESTHESIA  . Prostate enlargement     DR. NESI IS PT'S UROLOGIST    HPI: Pedro Lee, 73 y.o. male, has a history of pain and functional disability in the right knee due to arthritis and has failed non-surgical conservative treatments for greater than 12 weeks to includeNSAID's and/or analgesics, corticosteriod injections and activity modification. Onset of symptoms was gradual, starting 3+ years ago with gradually worsening course since that time. The patient noted prior procedures on the knee to include arthroplasty on the left knee per Dr. [name] 3 years ago. Patient currently rates pain in the right knee(s) at 7 out of 10 with activity. Patient has worsening of pain with activity and weight bearing, pain that interferes with activities of daily living, pain with passive range of motion, crepitus and joint swelling. Patient has evidence  of periarticular osteophytes and joint space narrowing by imaging studies. There is no active infection. Risks, benefits and expectations were discussed with the patient. Risks including but not limited to the risk of anesthesia, blood clots, nerve damage, blood vessel damage, failure of the prosthesis, infection and up to and including death. Patient understand the risks, benefits and expectations and wishes to proceed with surgery.  PCP: Kevan Ny, MD   Discharged Condition: good  Hospital Course:  Patient underwent the above stated procedure on 05/30/2013. Patient tolerated the procedure well and brought to the recovery room in good condition and subsequently to the floor.  POD #1 BP: 132/70 ; Pulse: 65 ; Temp: 97.8 F (36.6 C) ; Resp: 16  Patient reports pain as mild, pain controlled. No events throughout the night. Denies any CP or SOB. Ready to be discharged home.  Neurovascular intact, dorsiflexion/plantar flexion intact, incision: dressing C/D/I, no cellulitis present and compartment soft.   LABS  Basename    HGB  11.0  HCT  34.8    Discharge Exam: General appearance: alert, cooperative and no distress Extremities: Homans sign is negative, no sign of DVT, no edema, redness or tenderness in the calves or thighs and no ulcers, gangrene or trophic changes  Disposition:    Home with follow up in 2 weeks   Follow-up Information   Follow up with Mauri Pole, MD. Schedule an appointment as soon as possible for a visit in 2 weeks.  Specialty:  Orthopedic Surgery   Contact information:   [address] [place] [address] [phone]       Discharge Orders   Future Orders Complete By Expires   Call MD / Call 911  As directed    Change dressing  As directed    Constipation Prevention  As directed    Diet - low sodium heart healthy  As directed    Discharge instructions  As directed    Driving restrictions  As directed    Increase activity slowly as  tolerated  As directed    TED hose  As directed    Weight bearing as tolerated  As directed    Questions:     Laterality:     Extremity:          Medication List    STOP taking these medications       ibuprofen 200 MG tablet  Commonly known as:  ADVIL,MOTRIN      TAKE these medications       allopurinol 300 MG tablet  Commonly known as:  ZYLOPRIM  Take 300 mg by mouth daily.     aspirin 325 MG EC tablet  Take 1 tablet (325 mg total) by mouth 2 (two) times daily.     benazepril-hydrochlorthiazide 20-25 MG per tablet  Commonly known as:  LOTENSIN HCT  Take 1 tablet by mouth every morning.     carboxymethylcellulose 0.5 % Soln  Commonly known as:  REFRESH PLUS  Place 1 drop into both eyes 3 (three) times daily as needed (dry eyes).     colchicine 0.6 MG tablet  Take 0.6 mg by mouth as needed (gout).     DSS 100 MG Caps  Take 100 mg by mouth 2 (two) times daily.     dutasteride 0.5 MG capsule  Commonly known as:  AVODART  Take 0.5 mg by mouth every morning.     ferrous sulfate 325 (65 FE) MG tablet  Take 1 tablet (325 mg total) by mouth 3 (three) times daily after meals.     HYDROcodone-acetaminophen 7.5-325 MG per tablet  Commonly known as:  NORCO  Take 1-2 tablets by mouth every 4 (four) hours as needed for moderate pain.     KS ALLERCLEAR PO  Take by mouth. ONE DAY AS NEEDED FOR ALLERGIES     metoprolol succinate 100 MG 24 hr tablet  Commonly known as:  TOPROL-XL  Take 100 mg by mouth every other day. TAKES IN AM     polyethylene glycol packet  Commonly known as:  MIRALAX / GLYCOLAX  Take 17 g by mouth 2 (two) times daily.     RAPAFLO 8 MG Caps capsule  Generic drug:  silodosin  Take 8 mg by mouth. TAKES IN AM     tiZANidine 4 MG capsule  Commonly known as:  ZANAFLEX  Take 1 capsule (4 mg total) by mouth 3 (three) times daily as needed for muscle spasms.         Signed: [facility]   PAC  06/01/2013, 6:08 PM

## 2013-08-23 ENCOUNTER — Encounter: Payer: Self-pay | Admitting: Podiatry

## 2013-08-23 ENCOUNTER — Ambulatory Visit (INDEPENDENT_AMBULATORY_CARE_PROVIDER_SITE_OTHER): Payer: 59 | Admitting: Podiatry

## 2013-08-23 VITALS — BP 141/81 | HR 66 | Ht 77.0 in | Wt 299.0 lb

## 2013-08-23 DIAGNOSIS — M216X9 Other acquired deformities of unspecified foot: Secondary | ICD-10-CM

## 2013-08-23 DIAGNOSIS — M722 Plantar fascial fibromatosis: Secondary | ICD-10-CM

## 2013-08-23 DIAGNOSIS — M216X1 Other acquired deformities of right foot: Secondary | ICD-10-CM

## 2013-08-23 NOTE — Patient Instructions (Addendum)
Seen for pain in arch and ankle, swelling in ankle right foot. Noted of tight Achilles tendon. May benefit from physical therapy and stretch exercise. Custom orthotics also benefit the condition. Return in 6 weeks.

## 2013-08-23 NOTE — Progress Notes (Signed)
Subjective: Status post right knee surgery (05/24/13) and begin to have swollen ankle and arch is getting flatter. Having pain in arch and across ankle joint right foot. He is wearing sandals with built in arch support.   Review of Systems - General ROS: negative for - chills, fever, sleep disturbance, weight gain or weight loss Ophthalmic ROS: Think he is getting cataract. ENT ROS: negative Allergy and Immunology ROS: negative Respiratory ROS: Occasional gasping for breath.  Cardiovascular ROS: no chest pain or dyspnea on exertion Gastrointestinal ROS: no abdominal pain, change in bowel habits, or black or bloody stools Genito-Urinary ROS: Been treated for enlarged prostate. Musculoskeletal ROS: Left knee surgery 3 years ago, right knee surgery 2 month ago, arthritic pain in shoulder and issue with Gout.  Neurological ROS: no TIA or stroke symptoms Dermatological ROS: Peeling and breaking out skin on bottom of both feet during the summer.   Objective: Dermatologic: Peeling and dry skin plantar arch area bilateral. Vascular: All pedal pulses palpable. Mild ankle edema right. Neurologic: All epicritic and tactile sensations grossly intact. Pain at instep arch right. Orthopedic: severe tightness of Achilles tendon bilateral.  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 Ankle edema right, post knee surgery. Plantar fasciitis right secondary to compensatory hyperpronation from tight Achilles tendon.  Plan: Reviewed findings. Reviewed stretch exercise.  May benefit from custom orthotic inserts. Physical therapy referral initiated.

## 2013-12-01 ENCOUNTER — Encounter: Payer: Self-pay | Admitting: Podiatry

## 2013-12-01 ENCOUNTER — Ambulatory Visit (INDEPENDENT_AMBULATORY_CARE_PROVIDER_SITE_OTHER): Payer: 59 | Admitting: Podiatry

## 2013-12-01 VITALS — BP 145/90 | HR 56

## 2013-12-01 DIAGNOSIS — M722 Plantar fascial fibromatosis: Secondary | ICD-10-CM

## 2013-12-01 DIAGNOSIS — M216X9 Other acquired deformities of unspecified foot: Secondary | ICD-10-CM

## 2013-12-01 NOTE — Progress Notes (Signed)
Subjective: 73 year old male presents requesting to have custom orthotics made. Right heel is painful to get out chair or in the morning.   Objective: Dermatologic: Peeling and dry skin plantar arch area bilateral.  Vascular: All pedal pulses palpable. Mild ankle edema right.  Neurologic: All epicritic and tactile sensations grossly intact.  Pain at instep arch right.  Orthopedic: severe tightness of Achilles tendon bilateral.  Radiographic examination reveal flattened arch, anterior advancement of the talar head, elevated first ray L>R, plantar calcaneal spur on right foot.   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  Ankle edema right, post knee surgery.  Plantar fasciitis right secondary to compensatory hyperpronation from tight Achilles tendon.   Plan: Reviewed findings.  Continue with stretch exercise.  Both feet casted for orthotics.

## 2013-12-01 NOTE — Patient Instructions (Signed)
Seen for right heel pain. Both feet casted for Orthotics. Continue with stretch exercise for the tight Achilles tendon.  Will contact patient when orthotics are ready.

## 2014-01-19 ENCOUNTER — Other Ambulatory Visit: Payer: Self-pay | Admitting: Internal Medicine

## 2014-01-19 DIAGNOSIS — R0989 Other specified symptoms and signs involving the circulatory and respiratory systems: Secondary | ICD-10-CM

## 2014-01-24 ENCOUNTER — Ambulatory Visit (INDEPENDENT_AMBULATORY_CARE_PROVIDER_SITE_OTHER): Payer: 59 | Admitting: Podiatry

## 2014-01-24 ENCOUNTER — Encounter: Payer: Self-pay | Admitting: Podiatry

## 2014-01-24 VITALS — BP 146/89 | HR 72

## 2014-01-24 DIAGNOSIS — M216X9 Other acquired deformities of unspecified foot: Secondary | ICD-10-CM

## 2014-01-24 NOTE — Patient Instructions (Signed)
Orthotics are doing well.  Will order another pair for dress shoes. Still tight on right Achilles tendon and bulging out TNJ complex right with pain. Need to do more stretch exercise and use existing ankle support (OTC).  Return in one month.

## 2014-01-24 NOTE — Progress Notes (Signed)
Orthotics are doing well. Wants another pair for dress shoes. Having pain at right above instep below ankle joint medial aspect right foot. Heel pain was also in right foot. Still tight on right Achilles tendon and bulging out TNJ complex right. Need to do more stretch exercise and use existing ankle support (OTC).

## 2014-01-25 ENCOUNTER — Ambulatory Visit
Admission: RE | Admit: 2014-01-25 | Discharge: 2014-01-25 | Disposition: A | Discharge status: Home / Self Care | Payer: Medicare Other | Source: Ambulatory Visit | Attending: Internal Medicine | Admitting: Internal Medicine

## 2014-01-25 DIAGNOSIS — R0989 Other specified symptoms and signs involving the circulatory and respiratory systems: Secondary | ICD-10-CM

## 2016-02-08 ENCOUNTER — Encounter (HOSPITAL_COMMUNITY): Payer: Self-pay | Admitting: *Deleted

## 2016-02-08 ENCOUNTER — Emergency Department (HOSPITAL_COMMUNITY): Payer: Medicare Other

## 2016-02-08 ENCOUNTER — Emergency Department (HOSPITAL_COMMUNITY)
Admission: EM | Admit: 2016-02-08 | Discharge: 2016-02-08 | Disposition: A | Discharge status: Home / Self Care | Payer: Medicare Other | Attending: Emergency Medicine | Admitting: Emergency Medicine

## 2016-02-08 DIAGNOSIS — D352 Benign neoplasm of pituitary gland: Secondary | ICD-10-CM | POA: Diagnosis not present

## 2016-02-08 DIAGNOSIS — Z79899 Other long term (current) drug therapy: Secondary | ICD-10-CM | POA: Insufficient documentation

## 2016-02-08 DIAGNOSIS — R200 Anesthesia of skin: Secondary | ICD-10-CM | POA: Diagnosis present

## 2016-02-08 DIAGNOSIS — Z7982 Long term (current) use of aspirin: Secondary | ICD-10-CM | POA: Insufficient documentation

## 2016-02-08 DIAGNOSIS — Z96651 Presence of right artificial knee joint: Secondary | ICD-10-CM | POA: Insufficient documentation

## 2016-02-08 DIAGNOSIS — R791 Abnormal coagulation profile: Secondary | ICD-10-CM | POA: Insufficient documentation

## 2016-02-08 DIAGNOSIS — I10 Essential (primary) hypertension: Secondary | ICD-10-CM | POA: Diagnosis not present

## 2016-02-08 LAB — COMPREHENSIVE METABOLIC PANEL
ALT: 14 U/L — AB (ref 17–63)
AST: 24 U/L (ref 15–41)
Albumin: 4.1 g/dL (ref 3.5–5.0)
Alkaline Phosphatase: 64 U/L (ref 38–126)
Anion gap: 8 (ref 5–15)
BUN: 16 mg/dL (ref 6–20)
CHLORIDE: 103 mmol/L (ref 101–111)
CO2: 28 mmol/L (ref 22–32)
Calcium: 9.8 mg/dL (ref 8.9–10.3)
Creatinine, Ser: 1.32 mg/dL — ABNORMAL HIGH (ref 0.61–1.24)
GFR calc non Af Amer: 51 mL/min — ABNORMAL LOW (ref >60)
GFR, EST AFRICAN AMERICAN: 59 mL/min — AB (ref >60)
Glucose, Bld: 96 mg/dL (ref 65–99)
POTASSIUM: 4.3 mmol/L (ref 3.5–5.1)
SODIUM: 139 mmol/L (ref 135–145)
Total Bilirubin: 0.8 mg/dL (ref 0.3–1.2)
Total Protein: 6.8 g/dL (ref 6.5–8.1)

## 2016-02-08 LAB — DIFFERENTIAL
BASOS ABS: 0 10*3/uL (ref 0.0–0.1)
BASOS PCT: 0 %
EOS ABS: 0.4 10*3/uL (ref 0.0–0.7)
Eosinophils Relative: 6 %
Lymphocytes Relative: 36 %
Lymphs Abs: 2.5 10*3/uL (ref 0.7–4.0)
Monocytes Absolute: 0.9 10*3/uL (ref 0.1–1.0)
Monocytes Relative: 12 %
Neutro Abs: 3.3 10*3/uL (ref 1.7–7.7)
Neutrophils Relative %: 46 %

## 2016-02-08 LAB — CBC
HEMATOCRIT: 44.3 % (ref 39.0–52.0)
HEMOGLOBIN: 14.4 g/dL (ref 13.0–17.0)
MCH: 27.3 pg (ref 26.0–34.0)
MCHC: 32.5 g/dL (ref 30.0–36.0)
MCV: 84.1 fL (ref 78.0–100.0)
PLATELETS: 180 10*3/uL (ref 150–400)
RBC: 5.27 MIL/uL (ref 4.22–5.81)
RDW: 13.9 % (ref 11.5–15.5)
WBC: 7.1 10*3/uL (ref 4.0–10.5)

## 2016-02-08 LAB — PROTIME-INR
INR: 1.02
Prothrombin Time: 13.4 seconds (ref 11.4–15.2)

## 2016-02-08 LAB — I-STAT CHEM 8, ED
BUN: 21 mg/dL — ABNORMAL HIGH (ref 6–20)
CHLORIDE: 101 mmol/L (ref 101–111)
Calcium, Ion: 1.19 mmol/L (ref 1.15–1.40)
Creatinine, Ser: 1.3 mg/dL — ABNORMAL HIGH (ref 0.61–1.24)
Glucose, Bld: 80 mg/dL (ref 65–99)
HEMATOCRIT: 44 % (ref 39.0–52.0)
HEMOGLOBIN: 15 g/dL (ref 13.0–17.0)
POTASSIUM: 5.5 mmol/L — AB (ref 3.5–5.1)
SODIUM: 138 mmol/L (ref 135–145)
TCO2: 31 mmol/L (ref 0–100)

## 2016-02-08 LAB — I-STAT TROPONIN, ED: TROPONIN I, POC: 0 ng/mL (ref 0.00–0.08)

## 2016-02-08 LAB — CBG MONITORING, ED: GLUCOSE-CAPILLARY: 92 mg/dL (ref 65–99)

## 2016-02-08 LAB — APTT: aPTT: 28 seconds (ref 24–36)

## 2016-02-08 MED ORDER — GADOBENATE DIMEGLUMINE 529 MG/ML IV SOLN
20.0000 mL | Freq: Once | INTRAVENOUS | Status: AC | PRN
  Administered 2016-02-08: 20 mL via INTRAVENOUS

## 2016-02-08 MED ORDER — SODIUM CHLORIDE 0.9 % IV BOLUS (SEPSIS)
500.0000 mL | Freq: Once | INTRAVENOUS | Status: AC
  Administered 2016-02-08: 500 mL via INTRAVENOUS

## 2016-02-08 NOTE — ED Notes (Signed)
Pt transferred to CT.

## 2016-02-08 NOTE — ED Notes (Signed)
Pt returned from MRI °

## 2016-02-08 NOTE — ED Notes (Signed)
Pt reports awoke self at 0300. While sitting post awakening pt reports acute onset sharp pain down right arm with tingling to right fingertips. Pt denies above symptoms at present time but reports slight numbness to right side of lip. Slight asymmetry right lower face noted. Pt denies weakness.

## 2016-02-08 NOTE — ED Notes (Signed)
No asymmetry noted to right lower face. Pt denies numbness at present time.   NIH Stroke Scale Total = 0

## 2016-02-08 NOTE — ED Triage Notes (Signed)
Patient is alert and oriented x4.  He is compliaining of numbness with tingling in the right arm and face.  Patient denies any Hx of stroke.  Patient denies any pain.  Patient states that he has had visual problems but last night the problem got worse.

## 2016-02-08 NOTE — ED Notes (Signed)
Pt returned from CT °

## 2016-02-08 NOTE — ED Notes (Signed)
Rees at bedside. 

## 2016-02-08 NOTE — ED Provider Notes (Signed)
[facility] DEPT Provider Note   CSN: [id number] Arrival date & time: 02/08/16  [id number]     History   Chief Complaint Chief Complaint  Patient presents with  . Numbness    HPI DELLON REVETTE is a 75 y.o. male.  The history is provided by the patient. No language interpreter was used.   ISSAC WILCOTT is a 75 y.o. male who presents to the Emergency Department complaining of numbness.  He presents for evaluation of numbness that started at 3 AM this morning. He states that he was watching TV at 3 in the morning when he developed sudden onset right shoulder pain that radiated down his arm. He had associated numbness in his fingertips. Symptoms lasted for about 30 minutes and then resolved. He also had associated mild numbness on his right cheek. He has experienced blurred vision in bilateral eyes for the last month and this is persistent. He denies any associated headache, chest pain, shortness of breath, diaphoresis, abdominal pain, nausea, vomiting. He is concern for possible stroke and comes in for evaluation. Past Medical History:  Diagnosis Date  . Allergy   . Complication of anesthesia    HX OF ENLARGED PROSTATE AND UNABLE TO PASS WATER AFTER ANESTHESIA  . Dysrhythmia    HX ATRIAL FIBRILLATION - HAD ABLATION - NO LONGER HAS AF.  Marland Kitchen Essential hypertension, malignant   . Gout, unspecified   . Obesity, mild   . OSA (obstructive sleep apnea)    Mild - PT STATES HE DID NOT HAVE TO USE CPAP  . Osteoarthritis   . Osteoarthrosis, unspecified whether generalized or localized, unspecified site   . Other dyspnea and respiratory abnormality   . Prostate enlargement    DR. NESI IS PT'S UROLOGIST  . Pure hypercholesterolemia   . Renal insufficiency, mild    PT STATES NOT AWARE OF ANY KIDNEY PROBLEM    Patient Active Problem List   Diagnosis Date Noted  . Plantar fasciitis 12/01/2013  . Pronation deformity of ankle, acquired 12/01/2013  . Equinus deformity of foot, acquired  12/01/2013  . Expected blood loss anemia 05/31/2013  . Obese 05/31/2013  . S/P right TKA 05/30/2013    Past Surgical History:  Procedure Laterality Date  . HEART ABLATION FOR AF    . JOINT REPLACEMENT     LEFT TOTAL KNEE REPLACEMENT  . LEFT KNEE ARTHROSCOPY    . TOTAL KNEE ARTHROPLASTY Right 05/30/2013   Procedure: RIGHT TOTAL KNEE ARTHROPLASTY;  Surgeon: Mauri Pole, MD;  Location: [facility] ORS;  Service: Orthopedics;  Laterality: Right;       Home Medications    Prior to Admission medications   Medication Sig Start Date End Date Taking? Authorizing Provider  allopurinol (ZYLOPRIM) 300 MG tablet Take 300 mg by mouth daily.      Historical Provider, MD  aspirin 81 MG tablet Take 81 mg by mouth daily.    Historical Provider, MD  benazepril-hydrochlorthiazide (LOTENSIN HCT) 20-25 MG per tablet Take 1 tablet by mouth every morning.    Historical Provider, MD  colchicine 0.6 MG tablet Take 0.6 mg by mouth as needed (gout).     Historical Provider, MD  dutasteride (AVODART) 0.5 MG capsule Take 0.5 mg by mouth every morning.     Historical Provider, MD  gabapentin (NEURONTIN) 100 MG capsule  01/19/14   Historical Provider, MD  metoprolol (TOPROL-XL) 100 MG 24 hr tablet Take 100 mg by mouth every other day. TAKES IN AM    Historical  Provider, MD  silodosin (RAPAFLO) 8 MG CAPS capsule Take 8 mg by mouth. TAKES IN AM    Historical Provider, MD    Family History History reviewed. No pertinent family history.  Social History Social History  Substance Use Topics  . Smoking status: Never Smoker  . Smokeless tobacco: Never Used  . Alcohol use No     Allergies   Penicillins   Review of Systems Review of Systems  All other systems reviewed and are negative.    Physical Exam Updated Vital Signs BP 155/86 (BP Location: Left Arm)   Pulse (!) 57   Temp 98.1 F (36.7 C) (Oral)   Resp 18   SpO2 94%   Physical Exam  Constitutional: He is oriented to person, place, and time. He  appears well-developed and well-nourished.  HENT:  Head: Normocephalic and atraumatic.  Eyes: EOM are normal. Pupils are equal, round, and reactive to light.  Cardiovascular: Normal rate and regular rhythm.   No murmur heard. Pulmonary/Chest: Effort normal and breath sounds normal. No respiratory distress.  Abdominal: Soft. There is no tenderness. There is no rebound and no guarding.  Musculoskeletal: He exhibits no edema or tenderness.  No shoulder tenderness to palpation with preserved range of motion. 2+ radial pulses bilaterally.  Neurological: He is alert and oriented to person, place, and time.  Mild flattening of the right nasolabial fold. Sensation to light touch intact throughout the face, arms, legs. 5 out of 5 strength in all 4 extremities. No pronator drift.  Skin: Skin is warm and dry.  Psychiatric: He has a normal mood and affect. His behavior is normal.  Nursing note and vitals reviewed.    ED Treatments / Results  Labs (all labs ordered are listed, but only abnormal results are displayed) Labs Reviewed  CBC  DIFFERENTIAL  PROTIME-INR  APTT  COMPREHENSIVE METABOLIC PANEL  CBG MONITORING, ED  I-STAT TROPOININ, ED  CBG MONITORING, ED  I-STAT CHEM 8, ED    EKG  EKG Interpretation None       Radiology No results found.  Procedures Procedures (including critical care time)  Medications Ordered in ED Medications - No data to display   Initial Impression / Assessment and Plan / ED Course  I have reviewed the triage vital signs and the nursing notes.  Pertinent labs & imaging results that were available during my care of the patient were reviewed by me and considered in my medical decision making (see chart for details).  Clinical Course     Patient here for 1 month as vision changes as well as episode of numbness in his right arm and pain in his right shoulder last night. His pain and numbness and arm have resolved but he does have ongoing blurred  vision. Current clinical picture is not consistent with TIA, subarachnoid hemorrhage, CVA, ACS, dissection. 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 Discussed with neurosurgery, plan to DC home with prompt outpatient follow-up for further evaluation and treatment. Discussed with patient findings study and importance of prompt outpatient follow-up. Return precautions discussed as well.  Final Clinical Impressions(s) / ED Diagnoses   Final diagnoses:  None    New Prescriptions New Prescriptions   No medications on file     Quintella Reichert, MD 02/11/16 [phone]

## 2016-02-08 NOTE — ED Notes (Signed)
Pt transferred to MRI

## 2016-02-08 NOTE — ED Notes (Signed)
NIH Stroke Scale Total = 0

## 2016-03-04 ENCOUNTER — Other Ambulatory Visit: Payer: Self-pay | Admitting: Neurosurgery

## 2016-03-17 ENCOUNTER — Encounter (HOSPITAL_COMMUNITY)
Admission: RE | Admit: 2016-03-17 | Discharge: 2016-03-17 | Disposition: A | Discharge status: Home / Self Care | Payer: Medicare Other | Source: Ambulatory Visit | Attending: Neurosurgery | Admitting: Neurosurgery

## 2016-03-17 ENCOUNTER — Encounter (HOSPITAL_COMMUNITY): Payer: Self-pay

## 2016-03-17 DIAGNOSIS — Z01812 Encounter for preprocedural laboratory examination: Secondary | ICD-10-CM | POA: Insufficient documentation

## 2016-03-17 HISTORY — DX: Dyspnea, unspecified: R06.00

## 2016-03-17 LAB — CBC WITH DIFFERENTIAL/PLATELET
BASOS ABS: 0 10*3/uL (ref 0.0–0.1)
Basophils Relative: 0 %
Eosinophils Absolute: 0.6 10*3/uL (ref 0.0–0.7)
Eosinophils Relative: 8 %
HEMATOCRIT: 41.6 % (ref 39.0–52.0)
Hemoglobin: 13.6 g/dL (ref 13.0–17.0)
LYMPHS ABS: 2.7 10*3/uL (ref 0.7–4.0)
LYMPHS PCT: 37 %
MCH: 28.3 pg (ref 26.0–34.0)
MCHC: 32.7 g/dL (ref 30.0–36.0)
MCV: 86.7 fL (ref 78.0–100.0)
Monocytes Absolute: 0.7 10*3/uL (ref 0.1–1.0)
Monocytes Relative: 9 %
NEUTROS ABS: 3.4 10*3/uL (ref 1.7–7.7)
Neutrophils Relative %: 46 %
PLATELETS: 166 10*3/uL (ref 150–400)
RBC: 4.8 MIL/uL (ref 4.22–5.81)
RDW: 14.5 % (ref 11.5–15.5)
WBC: 7.4 10*3/uL (ref 4.0–10.5)

## 2016-03-17 LAB — BASIC METABOLIC PANEL
ANION GAP: 7 (ref 5–15)
BUN: 13 mg/dL (ref 6–20)
CALCIUM: 9.7 mg/dL (ref 8.9–10.3)
CO2: 29 mmol/L (ref 22–32)
Chloride: 104 mmol/L (ref 101–111)
Creatinine, Ser: 1.33 mg/dL — ABNORMAL HIGH (ref 0.61–1.24)
GFR, EST AFRICAN AMERICAN: 58 mL/min — AB (ref >60)
GFR, EST NON AFRICAN AMERICAN: 50 mL/min — AB (ref >60)
GLUCOSE: 96 mg/dL (ref 65–99)
POTASSIUM: 3.7 mmol/L (ref 3.5–5.1)
SODIUM: 140 mmol/L (ref 135–145)

## 2016-03-17 LAB — TYPE AND SCREEN
ABO/RH(D): O POS
Antibody Screen: NEGATIVE

## 2016-03-17 LAB — ABO/RH: ABO/RH(D): O POS

## 2016-03-17 NOTE — Pre-Procedure Instructions (Addendum)
    Pedro Lee  03/17/2016    Your procedure is scheduled on Tuesday, February 13.  Report to [name] Admitting at 6:00 AM.                Your surgery or procedure is scheduled for 8:00 AM   Call this number if you have problems the morning of surgery: [phone]          For any other questions, please call [phone], Monday - Friday 8 AM - 4 PM.   Remember:  Do not eat food or drink liquids after midnight Monday, February 12.  Take these medicines the morning of surgery with A SIP OF WATER : allopurinol (ZYLOPRIM), doxazosin (CARDURA) , metoprolol (TOPROL-XL),  montelukast (SINGULAIR), tamsulosin (FLOMAX).                 1 Week prior to surgery STOP taking Aspirin, Aspirin Products (Goody Powder, Excedrin Migraine), Ibuprofen (Advil), Naproxen (Aleve), Vitamins and Herbal Products (ie Fish Oil)  Do not wear jewelry, make-up or nail polish.   Do not wear lotions, powders, or perfumes, or deodorant.            Men may shave face and neck.  Do not bring valuables to the hospital.  [name] is not responsible for any belongings or valuables.  Contacts, dentures or bridgework may not be worn into surgery.  Leave your suitcase in the car.  After surgery it may be brought to your room.  For patients admitted to the hospital, discharge time will be determined by your treatment team.  Special instructions:  Review  [facility] - Preparing For Surgery.  Please read over the following fact sheets that you were given: Review  [facility] - Preparing For Surgery, Coughing and Deep Breathing and Pain Booklet

## 2016-03-18 ENCOUNTER — Inpatient Hospital Stay (HOSPITAL_COMMUNITY): Admission: RE | Admit: 2016-03-18 | Payer: Medicare Other | Source: Ambulatory Visit

## 2016-03-23 MED ORDER — SODIUM CHLORIDE 0.9 % IV SOLN
1500.0000 mg | INTRAVENOUS | Status: AC
  Administered 2016-03-24: 1500 mg via INTRAVENOUS
  Filled 2016-03-23: qty 1500

## 2016-03-23 NOTE — Anesthesia Preprocedure Evaluation (Addendum)
Anesthesia Evaluation  Patient identified by MRN, date of birth, ID band Patient awake    Reviewed: Allergy & Precautions, NPO status , Patient's Chart, lab work & pertinent test results, reviewed documented beta blocker date and time   History of Anesthesia Complications Negative for: history of anesthetic complications  Airway Mallampati: III  TM Distance: >3 FB Neck ROM: Full    Dental no notable dental hx. (+) Dental Advisory Given   Pulmonary sleep apnea ,    Pulmonary exam normal        Cardiovascular hypertension, Pt. on medications and Pt. on home beta blockers Normal cardiovascular exam+ dysrhythmias Atrial Fibrillation      Neuro/Psych negative psych ROS   GI/Hepatic negative GI ROS, Neg liver ROS,   Endo/Other  Morbid obesity  Renal/GU Renal InsufficiencyRenal disease     Musculoskeletal   Abdominal   Peds  Hematology   Anesthesia Other Findings   Reproductive/Obstetrics                            Anesthesia Physical Anesthesia Plan  ASA: III  Anesthesia Plan: General   Post-op Pain Management:    Induction: Intravenous  Airway Management Planned: Oral ETT  Additional Equipment: Arterial line and CVP  Intra-op Plan:   Post-operative Plan: Extubation in OR  Informed Consent: I have reviewed the patients History and Physical, chart, labs and discussed the procedure including the risks, benefits and alternatives for the proposed anesthesia with the patient or authorized representative who has indicated his/her understanding and acceptance.   Dental advisory given  Plan Discussed with: CRNA, Anesthesiologist and Surgeon  Anesthesia Plan Comments:        Anesthesia Quick Evaluation

## 2016-03-24 ENCOUNTER — Inpatient Hospital Stay (HOSPITAL_COMMUNITY): Payer: Medicare Other | Admitting: Anesthesiology

## 2016-03-24 ENCOUNTER — Encounter (HOSPITAL_COMMUNITY)
Admission: RE | Disposition: A | Discharge status: Home / Self Care | Payer: Self-pay | Source: Ambulatory Visit | Attending: Neurosurgery

## 2016-03-24 ENCOUNTER — Inpatient Hospital Stay (HOSPITAL_COMMUNITY): Payer: Medicare Other

## 2016-03-24 ENCOUNTER — Encounter (HOSPITAL_COMMUNITY): Payer: Self-pay | Admitting: *Deleted

## 2016-03-24 ENCOUNTER — Inpatient Hospital Stay (HOSPITAL_COMMUNITY)
Admission: RE | Admit: 2016-03-24 | Discharge: 2016-03-27 | DRG: 615 | Disposition: A | Discharge status: Home / Self Care | Payer: Medicare Other | Source: Ambulatory Visit | Attending: Neurosurgery | Admitting: Neurosurgery

## 2016-03-24 DIAGNOSIS — N400 Enlarged prostate without lower urinary tract symptoms: Secondary | ICD-10-CM | POA: Diagnosis present

## 2016-03-24 DIAGNOSIS — H5340 Unspecified visual field defects: Secondary | ICD-10-CM | POA: Diagnosis present

## 2016-03-24 DIAGNOSIS — Z79899 Other long term (current) drug therapy: Secondary | ICD-10-CM | POA: Diagnosis not present

## 2016-03-24 DIAGNOSIS — G4733 Obstructive sleep apnea (adult) (pediatric): Secondary | ICD-10-CM | POA: Diagnosis present

## 2016-03-24 DIAGNOSIS — D352 Benign neoplasm of pituitary gland: Secondary | ICD-10-CM | POA: Diagnosis present

## 2016-03-24 DIAGNOSIS — I10 Essential (primary) hypertension: Secondary | ICD-10-CM | POA: Diagnosis present

## 2016-03-24 DIAGNOSIS — I4891 Unspecified atrial fibrillation: Secondary | ICD-10-CM | POA: Diagnosis present

## 2016-03-24 DIAGNOSIS — E669 Obesity, unspecified: Secondary | ICD-10-CM | POA: Diagnosis present

## 2016-03-24 DIAGNOSIS — E7800 Pure hypercholesterolemia, unspecified: Secondary | ICD-10-CM | POA: Diagnosis present

## 2016-03-24 DIAGNOSIS — M109 Gout, unspecified: Secondary | ICD-10-CM | POA: Diagnosis present

## 2016-03-24 DIAGNOSIS — Z7982 Long term (current) use of aspirin: Secondary | ICD-10-CM

## 2016-03-24 DIAGNOSIS — Z96653 Presence of artificial knee joint, bilateral: Secondary | ICD-10-CM | POA: Diagnosis present

## 2016-03-24 DIAGNOSIS — Z6838 Body mass index (BMI) 38.0-38.9, adult: Secondary | ICD-10-CM | POA: Diagnosis not present

## 2016-03-24 DIAGNOSIS — Z23 Encounter for immunization: Secondary | ICD-10-CM

## 2016-03-24 HISTORY — PX: CRANIOTOMY: SHX93

## 2016-03-24 HISTORY — PX: TRANSNASAL APPROACH: SHX6149

## 2016-03-24 LAB — BASIC METABOLIC PANEL
ANION GAP: 9 (ref 5–15)
BUN: 9 mg/dL (ref 6–20)
CO2: 26 mmol/L (ref 22–32)
Calcium: 8.9 mg/dL (ref 8.9–10.3)
Chloride: 106 mmol/L (ref 101–111)
Creatinine, Ser: 1.13 mg/dL (ref 0.61–1.24)
GFR calc Af Amer: >60 mL/min (ref >60)
GFR calc non Af Amer: >60 mL/min (ref >60)
GLUCOSE: 149 mg/dL — AB (ref 65–99)
POTASSIUM: 4.1 mmol/L (ref 3.5–5.1)
Sodium: 141 mmol/L (ref 135–145)

## 2016-03-24 SURGERY — CRANIOTOMY HYPOPHYSECTOMY TRANSNASAL APPROACH
Anesthesia: General | Site: Nose

## 2016-03-24 MED ORDER — BACITRACIN ZINC 500 UNIT/GM EX OINT
TOPICAL_OINTMENT | CUTANEOUS | Status: DC | PRN
  Administered 2016-03-24 (×2): 1 via TOPICAL

## 2016-03-24 MED ORDER — BENAZEPRIL-HYDROCHLOROTHIAZIDE 20-25 MG PO TABS: 1.0000 | ORAL_TABLET | ORAL | Status: DC

## 2016-03-24 MED ORDER — MICROFIBRILLAR COLL HEMOSTAT EX POWD
CUTANEOUS | Status: AC
  Filled 2016-03-24: qty 5

## 2016-03-24 MED ORDER — ESMOLOL HCL 100 MG/10ML IV SOLN
INTRAVENOUS | Status: AC
  Filled 2016-03-24: qty 10

## 2016-03-24 MED ORDER — ACETAMINOPHEN 650 MG RE SUPP
650.0000 mg | RECTAL | Status: DC | PRN
Start: 2016-03-24 — End: 2016-03-27

## 2016-03-24 MED ORDER — BENAZEPRIL HCL 20 MG PO TABS
20.0000 mg | ORAL_TABLET | Freq: Every day | ORAL | Status: DC
  Administered 2016-03-24 – 2016-03-27 (×4): 20 mg via ORAL
  Filled 2016-03-24 (×4): qty 1

## 2016-03-24 MED ORDER — CHLORHEXIDINE GLUCONATE CLOTH 2 % EX PADS: 6.0000 | MEDICATED_PAD | Freq: Once | CUTANEOUS | Status: DC

## 2016-03-24 MED ORDER — EPHEDRINE SULFATE 50 MG/ML IJ SOLN
INTRAMUSCULAR | Status: DC | PRN
  Administered 2016-03-24 (×2): 10 mg via INTRAVENOUS
  Administered 2016-03-24: 5 mg via INTRAVENOUS

## 2016-03-24 MED ORDER — EPHEDRINE 5 MG/ML INJ
INTRAVENOUS | Status: AC
  Filled 2016-03-24: qty 10

## 2016-03-24 MED ORDER — OXYMETAZOLINE HCL 0.05 % NA SOLN
NASAL | Status: AC
  Filled 2016-03-24: qty 15

## 2016-03-24 MED ORDER — LEVETIRACETAM 500 MG/5ML IV SOLN
500.0000 mg | Freq: Two times a day (BID) | INTRAVENOUS | Status: DC
  Administered 2016-03-24 – 2016-03-25 (×2): 500 mg via INTRAVENOUS
  Filled 2016-03-24 (×3): qty 5

## 2016-03-24 MED ORDER — DOXAZOSIN MESYLATE 8 MG PO TABS
4.0000 mg | ORAL_TABLET | Freq: Every day | ORAL | Status: DC
  Administered 2016-03-24 – 2016-03-27 (×4): 4 mg via ORAL
  Filled 2016-03-24 (×4): qty 1

## 2016-03-24 MED ORDER — ROCURONIUM BROMIDE 100 MG/10ML IV SOLN
INTRAVENOUS | Status: DC | PRN
  Administered 2016-03-24: 100 mg via INTRAVENOUS

## 2016-03-24 MED ORDER — ONDANSETRON HCL 4 MG/2ML IJ SOLN
INTRAMUSCULAR | Status: AC
  Filled 2016-03-24: qty 2

## 2016-03-24 MED ORDER — PANTOPRAZOLE SODIUM 40 MG IV SOLR
40.0000 mg | Freq: Every day | INTRAVENOUS | Status: DC
  Administered 2016-03-24 – 2016-03-25 (×2): 40 mg via INTRAVENOUS
  Filled 2016-03-24 (×2): qty 40

## 2016-03-24 MED ORDER — ONDANSETRON HCL 4 MG PO TABS: 4.0000 mg | ORAL_TABLET | ORAL | Status: DC | PRN

## 2016-03-24 MED ORDER — DEXAMETHASONE SODIUM PHOSPHATE 10 MG/ML IJ SOLN
10.0000 mg | INTRAMUSCULAR | Status: AC
  Administered 2016-03-24: 10 mg via INTRAVENOUS
  Filled 2016-03-24: qty 1

## 2016-03-24 MED ORDER — DEXAMETHASONE SODIUM PHOSPHATE 10 MG/ML IJ SOLN
6.0000 mg | Freq: Four times a day (QID) | INTRAMUSCULAR | Status: AC
  Administered 2016-03-24 – 2016-03-25 (×4): 6 mg via INTRAVENOUS
  Filled 2016-03-24 (×4): qty 1

## 2016-03-24 MED ORDER — THROMBIN 5000 UNITS EX SOLR
OROMUCOSAL | Status: DC | PRN
  Administered 2016-03-24: 09:00:00 via TOPICAL

## 2016-03-24 MED ORDER — PHENYLEPHRINE HCL 10 MG/ML IJ SOLN
INTRAVENOUS | Status: DC | PRN
  Administered 2016-03-24: 50 ug/min via INTRAVENOUS

## 2016-03-24 MED ORDER — DEXAMETHASONE SODIUM PHOSPHATE 4 MG/ML IJ SOLN
4.0000 mg | Freq: Four times a day (QID) | INTRAMUSCULAR | Status: AC
  Administered 2016-03-25 – 2016-03-26 (×4): 4 mg via INTRAVENOUS
  Filled 2016-03-24 (×4): qty 1

## 2016-03-24 MED ORDER — LIDOCAINE 2% (20 MG/ML) 5 ML SYRINGE
INTRAMUSCULAR | Status: AC
  Filled 2016-03-24: qty 5

## 2016-03-24 MED ORDER — THROMBIN 5000 UNITS EX SOLR
CUTANEOUS | Status: AC
  Filled 2016-03-24: qty 5000

## 2016-03-24 MED ORDER — CEFAZOLIN SODIUM-DEXTROSE 2-4 GM/100ML-% IV SOLN
2.0000 g | Freq: Three times a day (TID) | INTRAVENOUS | Status: AC
  Administered 2016-03-24 (×2): 2 g via INTRAVENOUS
  Filled 2016-03-24 (×2): qty 100

## 2016-03-24 MED ORDER — SCOPOLAMINE 1 MG/3DAYS TD PT72
1.0000 | MEDICATED_PATCH | TRANSDERMAL | Status: DC
  Administered 2016-03-24: 1.5 mg via TRANSDERMAL

## 2016-03-24 MED ORDER — METOPROLOL SUCCINATE ER 100 MG PO TB24
100.0000 mg | ORAL_TABLET | Freq: Every day | ORAL | Status: AC
  Administered 2016-03-24: 100 mg via ORAL
  Filled 2016-03-24: qty 1

## 2016-03-24 MED ORDER — OXYMETAZOLINE HCL 0.05 % NA SOLN
NASAL | Status: DC | PRN
  Administered 2016-03-24: 1 via TOPICAL

## 2016-03-24 MED ORDER — PROMETHAZINE HCL 25 MG/ML IJ SOLN: 6.2500 mg | INTRAMUSCULAR | Status: DC | PRN

## 2016-03-24 MED ORDER — HEMOSTATIC AGENTS (NO CHARGE) OPTIME
TOPICAL | Status: DC | PRN
  Administered 2016-03-24: 1 via TOPICAL

## 2016-03-24 MED ORDER — MONTELUKAST SODIUM 10 MG PO TABS
10.0000 mg | ORAL_TABLET | Freq: Every day | ORAL | Status: DC
  Administered 2016-03-24 – 2016-03-27 (×4): 10 mg via ORAL
  Filled 2016-03-24 (×4): qty 1

## 2016-03-24 MED ORDER — PROMETHAZINE HCL 12.5 MG PO TABS
12.5000 mg | ORAL_TABLET | ORAL | Status: DC | PRN
  Filled 2016-03-24: qty 2

## 2016-03-24 MED ORDER — GLYCOPYRROLATE 0.2 MG/ML IJ SOLN
INTRAMUSCULAR | Status: DC | PRN
  Administered 2016-03-24: 0.2 mg via INTRAVENOUS

## 2016-03-24 MED ORDER — HYDROCHLOROTHIAZIDE 25 MG PO TABS
25.0000 mg | ORAL_TABLET | Freq: Every day | ORAL | Status: DC
  Administered 2016-03-24 – 2016-03-27 (×4): 25 mg via ORAL
  Filled 2016-03-24 (×4): qty 1

## 2016-03-24 MED ORDER — METOPROLOL SUCCINATE ER 100 MG PO TB24
100.0000 mg | ORAL_TABLET | Freq: Every day | ORAL | Status: DC
  Administered 2016-03-24 – 2016-03-27 (×4): 100 mg via ORAL
  Filled 2016-03-24 (×4): qty 1

## 2016-03-24 MED ORDER — HYDROMORPHONE HCL 1 MG/ML IJ SOLN: 0.2500 mg | INTRAMUSCULAR | Status: DC | PRN

## 2016-03-24 MED ORDER — PNEUMOCOCCAL VAC POLYVALENT 25 MCG/0.5ML IJ INJ
0.5000 mL | INJECTION | INTRAMUSCULAR | Status: AC
  Administered 2016-03-25: 0.5 mL via INTRAMUSCULAR
  Filled 2016-03-24: qty 0.5

## 2016-03-24 MED ORDER — LABETALOL HCL 5 MG/ML IV SOLN
10.0000 mg | INTRAVENOUS | Status: DC | PRN
  Administered 2016-03-24: 20 mg via INTRAVENOUS
  Filled 2016-03-24: qty 4
  Filled 2016-03-24: qty 8

## 2016-03-24 MED ORDER — FENTANYL CITRATE (PF) 100 MCG/2ML IJ SOLN
INTRAMUSCULAR | Status: AC
  Filled 2016-03-24: qty 2

## 2016-03-24 MED ORDER — ACETAMINOPHEN 325 MG PO TABS: 650.0000 mg | ORAL_TABLET | ORAL | Status: DC | PRN

## 2016-03-24 MED ORDER — ESMOLOL HCL 100 MG/10ML IV SOLN
INTRAVENOUS | Status: DC | PRN
  Administered 2016-03-24: 30 mg via INTRAVENOUS
  Administered 2016-03-24: 20 mg via INTRAVENOUS

## 2016-03-24 MED ORDER — MIDAZOLAM HCL 5 MG/5ML IJ SOLN
INTRAMUSCULAR | Status: DC | PRN
  Administered 2016-03-24: 2 mg via INTRAVENOUS

## 2016-03-24 MED ORDER — PROPOFOL 10 MG/ML IV BOLUS
INTRAVENOUS | Status: AC
  Filled 2016-03-24: qty 20

## 2016-03-24 MED ORDER — LACTATED RINGERS IV SOLN
INTRAVENOUS | Status: DC | PRN
  Administered 2016-03-24: 08:00:00 via INTRAVENOUS

## 2016-03-24 MED ORDER — HYDROMORPHONE HCL 1 MG/ML IJ SOLN
0.5000 mg | INTRAMUSCULAR | Status: DC | PRN
  Administered 2016-03-24: 1 mg via INTRAVENOUS
  Filled 2016-03-24: qty 1

## 2016-03-24 MED ORDER — SUGAMMADEX SODIUM 200 MG/2ML IV SOLN
INTRAVENOUS | Status: AC
  Filled 2016-03-24: qty 2

## 2016-03-24 MED ORDER — ONDANSETRON HCL 4 MG/2ML IJ SOLN
INTRAMUSCULAR | Status: DC | PRN
  Administered 2016-03-24: 4 mg via INTRAVENOUS

## 2016-03-24 MED ORDER — POTASSIUM CHLORIDE IN NACL 20-0.9 MEQ/L-% IV SOLN
INTRAVENOUS | Status: DC
  Administered 2016-03-24: 13:00:00 via INTRAVENOUS
  Filled 2016-03-24 (×2): qty 1000

## 2016-03-24 MED ORDER — COCAINE HCL 4 % EX SOLN
CUTANEOUS | Status: AC
  Filled 2016-03-24: qty 4

## 2016-03-24 MED ORDER — PHENYLEPHRINE 40 MCG/ML (10ML) SYRINGE FOR IV PUSH (FOR BLOOD PRESSURE SUPPORT)
PREFILLED_SYRINGE | INTRAVENOUS | Status: AC
  Filled 2016-03-24: qty 10

## 2016-03-24 MED ORDER — BACITRACIN-NEOMYCIN-POLYMYXIN 400-5-5000 EX OINT
TOPICAL_OINTMENT | CUTANEOUS | Status: AC
  Filled 2016-03-24: qty 1

## 2016-03-24 MED ORDER — ASPIRIN EC 81 MG PO TBEC
81.0000 mg | DELAYED_RELEASE_TABLET | Freq: Every day | ORAL | Status: DC
  Administered 2016-03-24 – 2016-03-27 (×4): 81 mg via ORAL
  Filled 2016-03-24 (×4): qty 1

## 2016-03-24 MED ORDER — VITAMIN B-12 1000 MCG PO TABS
500.0000 ug | ORAL_TABLET | Freq: Every day | ORAL | Status: DC
  Administered 2016-03-24 – 2016-03-27 (×4): 500 ug via ORAL
  Filled 2016-03-24 (×5): qty 1

## 2016-03-24 MED ORDER — FENTANYL CITRATE (PF) 100 MCG/2ML IJ SOLN
INTRAMUSCULAR | Status: AC
  Filled 2016-03-24: qty 4

## 2016-03-24 MED ORDER — THROMBIN 5000 UNITS EX SOLR
CUTANEOUS | Status: DC | PRN
  Administered 2016-03-24: 5000 [IU] via TOPICAL

## 2016-03-24 MED ORDER — LIDOCAINE-EPINEPHRINE (PF) 2 %-1:200000 IJ SOLN
INTRAMUSCULAR | Status: DC | PRN
  Administered 2016-03-24: 13 mL

## 2016-03-24 MED ORDER — ONDANSETRON HCL 4 MG/2ML IJ SOLN: 4.0000 mg | INTRAMUSCULAR | Status: DC | PRN

## 2016-03-24 MED ORDER — HYDRALAZINE HCL 20 MG/ML IJ SOLN
5.0000 mg | INTRAMUSCULAR | Status: AC | PRN
  Administered 2016-03-24 (×3): 5 mg via INTRAVENOUS

## 2016-03-24 MED ORDER — LABETALOL HCL 5 MG/ML IV SOLN
INTRAVENOUS | Status: AC
  Filled 2016-03-24: qty 4

## 2016-03-24 MED ORDER — FENTANYL CITRATE (PF) 100 MCG/2ML IJ SOLN
INTRAMUSCULAR | Status: DC | PRN
  Administered 2016-03-24 (×2): 50 ug via INTRAVENOUS
  Administered 2016-03-24: 150 ug via INTRAVENOUS

## 2016-03-24 MED ORDER — HYDROCODONE-ACETAMINOPHEN 5-325 MG PO TABS
1.0000 | ORAL_TABLET | ORAL | Status: DC | PRN
  Administered 2016-03-24 – 2016-03-25 (×2): 1 via ORAL
  Filled 2016-03-24 (×2): qty 1

## 2016-03-24 MED ORDER — ALLOPURINOL 100 MG PO TABS
300.0000 mg | ORAL_TABLET | Freq: Every day | ORAL | Status: DC
  Administered 2016-03-24 – 2016-03-27 (×4): 300 mg via ORAL
  Filled 2016-03-24: qty 1
  Filled 2016-03-24 (×2): qty 3
  Filled 2016-03-24: qty 1
  Filled 2016-03-24: qty 3

## 2016-03-24 MED ORDER — LABETALOL HCL 5 MG/ML IV SOLN
INTRAVENOUS | Status: DC | PRN
  Administered 2016-03-24 (×2): 20 mg via INTRAVENOUS

## 2016-03-24 MED ORDER — SODIUM CHLORIDE 0.9 % IV SOLN
INTRAVENOUS | Status: DC | PRN
  Administered 2016-03-24: 08:00:00 via INTRAVENOUS

## 2016-03-24 MED ORDER — LIDOCAINE HCL (CARDIAC) 20 MG/ML IV SOLN
INTRAVENOUS | Status: DC | PRN
  Administered 2016-03-24 (×2): 100 mg via INTRAVENOUS

## 2016-03-24 MED ORDER — PROPOFOL 10 MG/ML IV BOLUS
INTRAVENOUS | Status: DC | PRN
  Administered 2016-03-24: 200 mg via INTRAVENOUS
  Administered 2016-03-24: 50 mg via INTRAVENOUS

## 2016-03-24 MED ORDER — SUGAMMADEX SODIUM 200 MG/2ML IV SOLN
INTRAVENOUS | Status: DC | PRN
  Administered 2016-03-24: 200 mg via INTRAVENOUS

## 2016-03-24 MED ORDER — ACETAMINOPHEN 325 MG PO TABS
ORAL_TABLET | ORAL | Status: AC
  Filled 2016-03-24: qty 1

## 2016-03-24 MED ORDER — NALOXONE HCL 0.4 MG/ML IJ SOLN: 0.0800 mg | INTRAMUSCULAR | Status: DC | PRN

## 2016-03-24 MED ORDER — PHENYLEPHRINE HCL 10 MG/ML IJ SOLN
INTRAMUSCULAR | Status: DC | PRN
  Administered 2016-03-24: 120 ug via INTRAVENOUS
  Administered 2016-03-24 (×3): 80 ug via INTRAVENOUS
  Administered 2016-03-24: 120 ug via INTRAVENOUS

## 2016-03-24 MED ORDER — ROCURONIUM BROMIDE 50 MG/5ML IV SOSY
PREFILLED_SYRINGE | INTRAVENOUS | Status: AC
  Filled 2016-03-24: qty 10

## 2016-03-24 MED ORDER — LIDOCAINE-EPINEPHRINE (PF) 2 %-1:200000 IJ SOLN
INTRAMUSCULAR | Status: AC
  Filled 2016-03-24: qty 20

## 2016-03-24 MED ORDER — HYDRALAZINE HCL 20 MG/ML IJ SOLN
INTRAMUSCULAR | Status: AC
Start: 2016-03-24 — End: 2016-03-24
  Filled 2016-03-24: qty 1

## 2016-03-24 MED ORDER — DEXAMETHASONE SODIUM PHOSPHATE 4 MG/ML IJ SOLN
4.0000 mg | Freq: Three times a day (TID) | INTRAMUSCULAR | Status: DC
  Administered 2016-03-26 – 2016-03-27 (×3): 4 mg via INTRAVENOUS
  Filled 2016-03-24 (×3): qty 1

## 2016-03-24 MED ORDER — TAMSULOSIN HCL 0.4 MG PO CAPS
0.4000 mg | ORAL_CAPSULE | Freq: Every day | ORAL | Status: DC
  Administered 2016-03-24 – 2016-03-27 (×4): 0.4 mg via ORAL
  Filled 2016-03-24 (×4): qty 1

## 2016-03-24 MED ORDER — SODIUM CHLORIDE 0.9 % IV SOLN
INTRAVENOUS | Status: DC | PRN
  Administered 2016-03-24: 10:00:00 via INTRAVENOUS

## 2016-03-24 MED ORDER — BACITRACIN ZINC 500 UNIT/GM EX OINT
TOPICAL_OINTMENT | CUTANEOUS | Status: AC
  Filled 2016-03-24: qty 28.35

## 2016-03-24 MED ORDER — MIDAZOLAM HCL 2 MG/2ML IJ SOLN
INTRAMUSCULAR | Status: AC
  Filled 2016-03-24: qty 2

## 2016-03-24 MED ORDER — SCOPOLAMINE 1 MG/3DAYS TD PT72
MEDICATED_PATCH | TRANSDERMAL | Status: AC
  Filled 2016-03-24: qty 1

## 2016-03-24 SURGICAL SUPPLY — 103 items
APL SKNCLS STERI-STRIP NONHPOA (GAUZE/BANDAGES/DRESSINGS) ×1
ATTRACTOMAT 16X20 MAGNETIC DRP (DRAPES) ×3 IMPLANT
BAG DECANTER FOR FLEXI CONT (MISCELLANEOUS) ×1 IMPLANT
BENZOIN TINCTURE PRP APPL 2/3 (GAUZE/BANDAGES/DRESSINGS) ×3 IMPLANT
BLADE 10 SAFETY STRL DISP (BLADE) ×3 IMPLANT
BLADE SURG 10 STRL SS (BLADE) ×1 IMPLANT
BLADE SURG 11 STRL SS (BLADE) ×4 IMPLANT
BLADE SURG 15 STRL LF DISP TIS (BLADE) ×2 IMPLANT
BLADE SURG 15 STRL SS (BLADE) ×3
CANISTER SUCT 3000ML PPV (MISCELLANEOUS) ×3 IMPLANT
CANISTER SUCTION 2500CC (MISCELLANEOUS) ×3 IMPLANT
CARTRIDGE OIL MAESTRO DRILL (MISCELLANEOUS) ×1 IMPLANT
CLEANER TIP ELECTROSURG 2X2 (MISCELLANEOUS) IMPLANT
CLOSURE WOUND 1/2 X4 (GAUZE/BANDAGES/DRESSINGS) ×1
COAGULATOR SUCT 8FR VV (MISCELLANEOUS) IMPLANT
CORDS BIPOLAR (ELECTRODE) ×1 IMPLANT
COVER BACK TABLE 60X90IN (DRAPES) IMPLANT
COVER MAYO STAND STRL (DRAPES) ×3 IMPLANT
DIFFUSER DRILL AIR PNEUMATIC (MISCELLANEOUS) ×1 IMPLANT
DRAPE EENT ADH APERT 15X15 STR (DRAPES) IMPLANT
DRAPE HALF SHEET 40X57 (DRAPES) ×2 IMPLANT
DRAPE INCISE IOBAN 66X45 STRL (DRAPES) ×1 IMPLANT
DRAPE MICROSCOPE LEICA (MISCELLANEOUS) ×3 IMPLANT
DRAPE POUCH INSTRU U-SHP 10X18 (DRAPES) ×3 IMPLANT
DRAPE PROXIMA HALF (DRAPES) IMPLANT
DRAPE SURG 17X23 STRL (DRAPES) ×5 IMPLANT
DRESSING NASAL KENNEDY 3.5X.9 (MISCELLANEOUS) ×1 IMPLANT
DRESSING NASAL POPE 10X1.5X2.5 (GAUZE/BANDAGES/DRESSINGS) IMPLANT
DRESSING TELFA 8X3 (GAUZE/BANDAGES/DRESSINGS) ×3 IMPLANT
DRSG NASAL KENNEDY 3.5X.9 (MISCELLANEOUS) ×3
DRSG NASAL POPE 10X1.5X2.5 (GAUZE/BANDAGES/DRESSINGS) ×6
DRSG TELFA 3X8 NADH (GAUZE/BANDAGES/DRESSINGS) ×3 IMPLANT
DURASEAL SPINE SEALANT 3ML (MISCELLANEOUS) ×2 IMPLANT
ELECT COATED BLADE 2.86 ST (ELECTRODE) ×2 IMPLANT
ELECT NDL TIP 2.8 STRL (NEEDLE) ×1 IMPLANT
ELECT NEEDLE TIP 2.8 STRL (NEEDLE) ×3 IMPLANT
ELECT REM PT RETURN 9FT ADLT (ELECTROSURGICAL) ×3
ELECTRODE REM PT RTRN 9FT ADLT (ELECTROSURGICAL) ×1 IMPLANT
GAUZE PACKING FOLDED 2  STR (GAUZE/BANDAGES/DRESSINGS)
GAUZE PACKING FOLDED 2 STR (GAUZE/BANDAGES/DRESSINGS) ×1 IMPLANT
GAUZE SPONGE 2X2 8PLY STRL LF (GAUZE/BANDAGES/DRESSINGS) ×1 IMPLANT
GAUZE SPONGE 4X4 12PLY STRL (GAUZE/BANDAGES/DRESSINGS) ×1 IMPLANT
GLOVE ECLIPSE 9.0 STRL (GLOVE) ×6 IMPLANT
GLOVE EXAM NITRILE LRG STRL (GLOVE) IMPLANT
GLOVE EXAM NITRILE XL STR (GLOVE) IMPLANT
GLOVE EXAM NITRILE XS STR PU (GLOVE) IMPLANT
GLOVE SS BIOGEL STRL SZ 7.5 (GLOVE) ×1 IMPLANT
GLOVE SUPERSENSE BIOGEL SZ 7.5 (GLOVE) ×2
GOWN STRL REUS W/ TWL LRG LVL3 (GOWN DISPOSABLE) ×1 IMPLANT
GOWN STRL REUS W/ TWL XL LVL3 (GOWN DISPOSABLE) ×1 IMPLANT
GOWN STRL REUS W/TWL 2XL LVL3 (GOWN DISPOSABLE) IMPLANT
GOWN STRL REUS W/TWL LRG LVL3 (GOWN DISPOSABLE) ×9
GOWN STRL REUS W/TWL XL LVL3 (GOWN DISPOSABLE) ×6
HEMOSTAT SURGICEL 2X14 (HEMOSTASIS) ×1 IMPLANT
KIT BASIN OR (CUSTOM PROCEDURE TRAY) ×4 IMPLANT
KIT DRAIN CSF ACCUDRAIN (MISCELLANEOUS) IMPLANT
KIT ROOM TURNOVER OR (KITS) ×4 IMPLANT
NDL HYPO 25GX1X1/2 BEV (NEEDLE) IMPLANT
NDL HYPO 25X1 1.5 SAFETY (NEEDLE) ×1 IMPLANT
NDL PRECISIONGLIDE 27X1.5 (NEEDLE) ×1 IMPLANT
NDL SPNL 22GX3.5 QUINCKE BK (NEEDLE) ×1 IMPLANT
NEEDLE HYPO 25GX1X1/2 BEV (NEEDLE) IMPLANT
NEEDLE HYPO 25X1 1.5 SAFETY (NEEDLE) ×3 IMPLANT
NEEDLE PRECISIONGLIDE 27X1.5 (NEEDLE) ×3 IMPLANT
NEEDLE SPNL 22GX3.5 QUINCKE BK (NEEDLE) ×3 IMPLANT
NS IRRIG 1000ML POUR BTL (IV SOLUTION) ×4 IMPLANT
OIL CARTRIDGE MAESTRO DRILL (MISCELLANEOUS)
PACK LAMINECTOMY NEURO (CUSTOM PROCEDURE TRAY) ×2 IMPLANT
PAD ARMBOARD 7.5X6 YLW CONV (MISCELLANEOUS) ×7 IMPLANT
PAD DRESSING TELFA 3X8 NADH (GAUZE/BANDAGES/DRESSINGS) IMPLANT
PATTIES SURGICAL .25X.25 (GAUZE/BANDAGES/DRESSINGS) IMPLANT
PATTIES SURGICAL .5 X.5 (GAUZE/BANDAGES/DRESSINGS) IMPLANT
PATTIES SURGICAL .5 X3 (DISPOSABLE) ×4 IMPLANT
PENCIL BUTTON HOLSTER BLD 10FT (ELECTRODE) ×1 IMPLANT
PENCIL FOOT CONTROL (ELECTRODE) IMPLANT
RUBBERBAND STERILE (MISCELLANEOUS) ×6 IMPLANT
SPLINT NASAL DOYLE BI-VL (GAUZE/BANDAGES/DRESSINGS) ×2 IMPLANT
SPONGE GAUZE 2X2 STER 10/PKG (GAUZE/BANDAGES/DRESSINGS)
SPONGE LAP 4X18 X RAY DECT (DISPOSABLE) ×1 IMPLANT
SPONGE SURGIFOAM ABS GEL 12-7 (HEMOSTASIS) ×3 IMPLANT
STAPLER SKIN PROX WIDE 3.9 (STAPLE) ×1 IMPLANT
STRIP CLOSURE SKIN 1/2X4 (GAUZE/BANDAGES/DRESSINGS) ×2 IMPLANT
SUT BONE WAX W31G (SUTURE) ×1 IMPLANT
SUT CHROMIC 3 0 PS 2 (SUTURE) ×2 IMPLANT
SUT CHROMIC 4 0 P 3 18 (SUTURE) ×2 IMPLANT
SUT CHROMIC 4 0 PS 2 18 (SUTURE) ×6 IMPLANT
SUT CHROMIC 5 0 P 3 (SUTURE) IMPLANT
SUT ETHILON 3 0 PS 1 (SUTURE) IMPLANT
SUT ETHILON 4 0 PS 2 18 (SUTURE) IMPLANT
SUT ETHILON 5 0 P 3 18 (SUTURE)
SUT ETHILON 6 0 P 1 (SUTURE) IMPLANT
SUT NYLON ETHILON 5-0 P-3 1X18 (SUTURE) IMPLANT
SUT PLAIN 4 0 ~~LOC~~ 1 (SUTURE) IMPLANT
SUT SILK 2 0 FS (SUTURE) ×1 IMPLANT
SUT VIC AB 2-0 CP2 18 (SUTURE) ×2 IMPLANT
SUT VIC AB 4-0 P-3 18X BRD (SUTURE) ×1 IMPLANT
SUT VIC AB 4-0 P3 18 (SUTURE)
SYR CONTROL 10ML LL (SYRINGE) ×1 IMPLANT
TOWEL OR 17X24 6PK STRL BLUE (TOWEL DISPOSABLE) ×3 IMPLANT
TOWEL OR 17X26 10 PK STRL BLUE (TOWEL DISPOSABLE) ×3 IMPLANT
TRAY ENT MC OR (CUSTOM PROCEDURE TRAY) ×4 IMPLANT
TRAY FOLEY W/METER SILVER 16FR (SET/KITS/TRAYS/PACK) ×3 IMPLANT
WATER STERILE IRR 1000ML POUR (IV SOLUTION) ×4 IMPLANT

## 2016-03-24 NOTE — Op Note (Signed)
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  Asst. Surgeon: Vertell Limber  ENT surgeon: Lucia Gaskins  Anesthesia: General  Indication: 76 year old male with a incidentally discovered a large pituitary macroadenoma extending into his third ventricle. 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 Endocrine studies negative. Patient presents now for debulking of tumor for diagnosis and hopeful preservation of vision   Operative note: after induction of anesthesia, patient position supine with his head fixed in a [place] pin headrest. Dr. Lucia Gaskins prepped and draped the face and nose per his routine. He performed a transseptal approach into the sphenoid sinus. I brought the microscope into the field. The sphenoid sinus was explored. The sella turcica and sellar floor were identified. Positioning was confirmed by fluoroscopy. The floor the sella was then fractured using an osteotome. It was then resected using Kerrison rongeurs. The dura was then coagulated using the monopolar. The dura was then cut in a cruciate fashion. The tumor itself was somewhat friable but not terribly liquefied. I removed specimens for pathology. I scraped the sellar walls and removed further tumor. I extended this up and located the opening of the diaphragmatic sella. I carefully used suction and gentle dissection using curettes within the tumor cavity. I had anesthesia perform the Salva maneuvers in hopes of forcing more tumor down into the field. At this point I did not see any further benefit in suprasellar exploration and worried about significant risk of injury. I irrigated the area clean. There was no evidence of CSF leakage area did sellar floor was reapproximated using a  piece of cartilage from the septoplasty. DuraSeal fibrin glue was placed over the cartilage graft. Dr. Lucia Gaskins returns to close the nose in a typical fashion. There were no apparent complications. Patient tolerated the procedure well. He returns to the recovery room postop.

## 2016-03-24 NOTE — Transfer of Care (Signed)
Immediate Anesthesia Transfer of Care Note  Patient: Pedro Lee  Procedure(s) Performed: Procedure(s) with comments: Transphenoidal Resection of Tumor (N/A) - Transphenoidal Resection of Tumor TRANSNASAL APPROACH (N/A) - TRANSNASAL APPROACH  Patient Location: PACU  Anesthesia Type:General  Level of Consciousness: sedated  Airway & Oxygen Therapy: Patient Spontanous Breathing and Patient connected to face mask oxygen  Post-op Assessment: Report given to RN, Post -op Vital signs reviewed and stable and Patient moving all extremities X 4  Post vital signs: Reviewed and stable  Last Vitals:  Vitals:   03/24/16 0607  BP: (!) 197/97  Pulse: 75  Resp: 20  Temp: 37.1 C    Last Pain:  Vitals:   03/24/16 0607  TempSrc: Oral      Patients Stated Pain Goal: 4 ([id number])  Complications: No apparent anesthesia complications

## 2016-03-24 NOTE — Anesthesia Procedure Notes (Signed)
Procedure Name: Intubation Date/Time: 03/24/2016 8:30 AM Performed by: Mervyn Gay Pre-anesthesia Checklist: Patient identified, Patient being monitored, Timeout performed, Emergency Drugs available and Suction available Patient Re-evaluated:Patient Re-evaluated prior to inductionOxygen Delivery Method: Circle System Utilized Preoxygenation: Pre-oxygenation with 100% oxygen Intubation Type: IV induction Ventilation: Mask ventilation without difficulty Laryngoscope Size: Mac and 4 Grade View: Grade I Tube type: Oral Tube size: 8.0 mm Number of attempts: 1 Airway Equipment and Method: Stylet Placement Confirmation: ETT inserted through vocal cords under direct vision,  positive ETCO2 and breath sounds checked- equal and bilateral Secured at: 23 cm Tube secured with: Tape Dental Injury: Teeth and Oropharynx as per pre-operative assessment

## 2016-03-24 NOTE — Anesthesia Procedure Notes (Signed)
Procedures

## 2016-03-24 NOTE — Brief Op Note (Signed)
03/24/2016  10:11 AM  PATIENT:  [name]  76 y.o. male  PRE-OPERATIVE DIAGNOSIS:  Pituitary macroadenoma  POST-OPERATIVE DIAGNOSIS:  Pituitary macroadenoma  PROCEDURE:  Procedure(s) with comments: Transphenoidal Resection of Tumor (N/A) - Transphenoidal Resection of Tumor TRANSNASAL APPROACH (N/A) - TRANSNASAL APPROACH  SURGEON:  Surgeon(s) and Role: Panel 1:    * Earnie Larsson, MD - Primary    * Erline Levine, MD - Assisting  Panel 2:    * Rozetta Nunnery, MD - Primary  PHYSICIAN ASSISTANT:   ASSISTANTS:    ANESTHESIA:   general  EBL:  Total I/O In: 1000 [I.V.:1000] Out: 450 [Urine:350; Blood:100]  BLOOD ADMINISTERED:none  DRAINS: none   LOCAL MEDICATIONS USED:  XYLOCAINE   SPECIMEN:  Source of Specimen:  pituitary  DISPOSITION OF SPECIMEN:  PATHOLOGY  COUNTS:  YES  TOURNIQUET:  * No tourniquets in log *  DICTATION: .Dragon Dictation  PLAN OF CARE: Admit to inpatient   PATIENT DISPOSITION:  PACU - hemodynamically stable.   Delay start of Pharmacological VTE agent (>24hrs) due to surgical blood loss or risk of bleeding: yes

## 2016-03-24 NOTE — Op Note (Signed)
NAME:  ABDULSAMAD, TEXEIRA NO.:  [id number]  MEDICAL RECORD NO.:  [id number]  LOCATION:  [facility]                         FACILITY:  [place]  PHYSICIAN:  Leonides Sake. Lucia Gaskins, M.D.DATE OF BIRTH:  May 08, 1940  DATE OF PROCEDURE:  03/24/2016 DATE OF DISCHARGE:                              OPERATIVE REPORT   PREOPERATIVE DIAGNOSIS:  Pituitary macroadenoma.  POSTOPERATIVE DIAGNOSIS:  Pituitary macroadenoma.  OPERATION PERFORMED:  Transseptal transsphenoidal resection of pituitary tumor.  SURGEON:  Leonides Sake. Lucia Gaskins, M.D.  Lolly MustacheMallie Mussel A. Pool, M.D.  ANESTHESIA:  General endotracheal.  COMPLICATIONS:  None.  BRIEF CLINICAL NOTE:  Pedro Lee is a 76 year old gentleman, who was found to have a pituitary tumor on recent MRI scan.  This extended supratentorially and was fairly large and felt to be best treated surgically with a transseptal transsphenoidal resection of pituitary tumor.  He had very little visual defect and no significant endocrine problems.  He does have occasional headache, but mostly in the temporal region.  On exam, he has a relatively straight septum with large nasal cavities.  I discussed the transseptal transsphenoidal resection of pituitary tumor with the patient prior to procedure.  He is acceptable of risk.  DESCRIPTION OF PROCEDURE:  After adequate endotracheal anesthesia, Dr. Annette Stable positioned the patient in the bed in neurosurgical OR including position of the C-arm for later use.  Next, the nose was prepped with Betadine solution by myself and draped out with sterile towels.  The nose was then further prepped with cotton pledgets soaked in Afrin, and septum and nasal cavity were injected with 10-12 mL of 2% Xylocaine with 1:100,000 epinephrine.  An extended hemitransfixion incision was made along the caudal edge of septum on the right side and the right nostril extended onto the floor of the nose. Then, using a Freer and  mucoperichondrial and mucoperiosteal flaps were elevated off the floor of the nose on the right side and off the right side of the septum.  In addition, mucoperiosteal flap was elevated on the floor of the nose on the left side, and the anterior cartilage was dislocated off the maxillary crest and pushed over to the left side of the nose.  Prior to doing this, a vertical incision was made in the cartilaginous septum approximately 2-2.5 cm posteriorly just anterior to the vomer and a piece of cartilage was harvested for later use measuring approximately 1.5 x 2 cm in size.  Elevation was then carried along the bony vomer back to the space of the sphenoid.  The [facility] retractor was placed.  A sphenoidotomy was created with 4 mm osteotome and Kerrison forceps.  The sphenoidotomy was enlarged especially superiorly in order to get adequate visualization as the [facility] retractor to support a little bit more inferiorly to the lower portion of the sella.  The microscope was brought in place and the floor of the sella was easily visualized. This was checked with the C-arm.  Next, Dr. Annette Stable took over and opened up the sella and the pituitary and performed biopsies and removal of the pituitary tumor.  Following removal of the tumor, pieces of cartilage was used to help cover up the opening of  the sella and then I was called back in for closure.  No fat was used.  Patent sphenoid.  The sphenoid was full of portion of clotted blood and nothing was removed from this.  The septum was brought back to midline on the maxillary crest.  The mucoperichondrial and mucoperiosteal flaps were then brought back over the septum.  The extended hemitransfixion incision was closed with interrupted 4-0 chromic sutures x4 and the septum was fixed with a 4-0 chromic suture. Splints were secured on either side of the septum with a single 4-0 nylon suture.  The nose was then further packed with Merocel packs placed within the  nose on both sides and were hydrated with saline and covered with bacitracin ointment.  This completed the procedure. Oropharynx was suctioned of any blood and an NG tube was passed to the stomach and likewise suctioned.  The patient was subsequently awaken from anesthesia and transferred to recovery room postop, doing well.  DISPOSITION:  The patient will be admitted to the neuro ICU.  We will plan on removing his nasal packing in 2 days and we are planning on having him follow up in my office in 7-10 days to have his splints removed from the septum.          ______________________________ Leonides Sake. Lucia Gaskins, M.D.     CEN/MEDQ  D:  03/24/2016  T:  03/24/2016  Job:  [id number]

## 2016-03-24 NOTE — H&P (Signed)
Pedro Lee is an 76 y.o. male.   Chief Complaint: Pituitary tumor HPI: 76 year old male evaluated for an incidentally discovered suprasellar mass with marked compression of his chiasm and extension into his hypothalamus and third ventricle. Mass consistent with a pituitary adenoma. Warm on studies negative. Visual fields with some superior temporal quadrant visual field loss bilaterally. Patient presents now for transsphenoidal resection of his pituitary macroadenoma  Past Medical History:  Diagnosis Date  . Allergy    sesonal  . Complication of anesthesia    HX OF ENLARGED PROSTATE AND UNABLE TO PASS WATER AFTER ANESTHESIA  . Dyspnea    with some exertion  . Dysrhythmia    HX ATRIAL FIBRILLATION - HAD ABLATION - NO LONGER HAS AF.  Marland Kitchen Essential hypertension, malignant   . Gout, unspecified   . Obesity, mild   . OSA (obstructive sleep apnea)    Mild - PT STATES HE DID NOT HAVE TO USE CPAP  . Osteoarthritis   . Osteoarthrosis, unspecified whether generalized or localized, unspecified site   . Other dyspnea and respiratory abnormality   . Prostate enlargement    DR. NESI IS PT'S UROLOGIST  . Pure hypercholesterolemia   . Renal insufficiency, mild    PT STATES NOT AWARE OF ANY KIDNEY PROBLEM    Past Surgical History:  Procedure Laterality Date  . COLONOSCOPY    . HEART ABLATION FOR AF  1998  . JOINT REPLACEMENT     LEFT TOTAL KNEE REPLACEMENT  . KNEE ARTHROPLASTY Left 2013ish  . LEFT KNEE ARTHROSCOPY    . TOTAL KNEE ARTHROPLASTY Right 05/30/2013   Procedure: RIGHT TOTAL KNEE ARTHROPLASTY;  Surgeon: Mauri Pole, MD;  Location: [facility];  Service: Orthopedics;  Laterality: Right;    History reviewed. No pertinent family history. Social History:  reports that he has never smoked. He has never used smokeless tobacco. He reports that he does not drink alcohol or use drugs.  Allergies:  Allergies  Allergen Reactions  . Penicillins Rash    Medications Prior to Admission   Medication Sig Dispense Refill  . allopurinol (ZYLOPRIM) 300 MG tablet Take 300 mg by mouth daily.      Marland Kitchen aspirin EC 81 MG tablet Take 81 mg by mouth daily.    . benazepril-hydrochlorthiazide (LOTENSIN HCT) 20-25 MG per tablet Take 1 tablet by mouth every morning.    . colchicine 0.6 MG tablet Take 0.6 mg by mouth as needed (gout).     Marland Kitchen doxazosin (CARDURA) 4 MG tablet Take 4 mg by mouth daily.    . metoprolol (TOPROL-XL) 100 MG 24 hr tablet Take 100 mg by mouth daily.     . montelukast (SINGULAIR) 10 MG tablet Take 10 mg by mouth daily.  tm  . OVER THE COUNTER MEDICATION Place 1 drop into both eyes as needed (for dry eyes). Crocodile Tears    . tamsulosin (FLOMAX) 0.4 MG CAPS capsule Take 0.4 mg by mouth daily.    . vitamin B-12 (CYANOCOBALAMIN) 500 MCG tablet Take 500 mcg by mouth daily.      No results found for this or any previous visit (from the past 48 hour(s)). No results found.  Pertinent items noted in HPI and remainder of comprehensive ROS otherwise negative.  Blood pressure (!) 197/97, pulse 75, temperature 98.8 F (37.1 C), temperature source Oral, resp. rate 20, SpO2 98 %.  Patient is awake and alert. He is oriented and appropriate. His speech is fluent. His judgment and insight are intact.  Cranial nerve function normal bilaterally except for some mild superior lateral visual field loss bilaterally. Motor 5/5 bilaterally. Sensory examination nonfocal. Reflexes normal. No evidence of pronator drift. Gait and posture normal. Examination head ears eyes and throat is unremarkable. Chest and abdomen are benign. Extremities are free injury deformity. Assessment/Plan Large pituitary macroadenoma with chiasmatic compression and early visual loss. Plan transsphenoidal resection of tumor for hopeful decompression of his optic apparatus and decompression of his hypothalamic compression. Risks and benefits been explained. Patient wishes to proceed.  Octavia Mottola A 03/24/2016, 7:41  AM

## 2016-03-24 NOTE — Anesthesia Postprocedure Evaluation (Addendum)
Anesthesia Post Note  Patient: Pedro Lee  Procedure(s) Performed: Procedure(s) (LRB): Transphenoidal Resection of Tumor (N/A) TRANSNASAL APPROACH (N/A)  Patient location during evaluation: PACU Anesthesia Type: General Level of consciousness: sedated Pain management: pain level controlled Vital Signs Assessment: post-procedure vital signs reviewed and stable Respiratory status: spontaneous breathing and respiratory function stable Cardiovascular status: stable Anesthetic complications: no       Last Vitals:  Vitals:   03/24/16 1145 03/24/16 1200  BP:  (!) 178/89  Pulse: 63 67  Resp: 15 14  Temp: 36.2 C 36.4 C    Last Pain:  Vitals:   03/24/16 1200  TempSrc: Axillary  PainSc:                  [name]

## 2016-03-24 NOTE — Brief Op Note (Signed)
03/24/2016  10:42 AM  PATIENT:  Pedro Lee  76 y.o. male  PRE-OPERATIVE DIAGNOSIS:  Pituitary macroadenoma  POST-OPERATIVE DIAGNOSIS:  Pituitary macroadenoma  PROCEDURE:  Procedure(s) with comments: Transphenoidal Resection of Tumor (N/A) - Transphenoidal Resection of Tumor TRANSNASAL APPROACH (N/A) - TRANSNASAL APPROACH  SURGEON:  Surgeon(s) and Role: Panel 1:    * Earnie Larsson, MD - Primary    * Erline Levine, MD - Assisting  Panel 2:    * Rozetta Nunnery, MD - Primary  PHYSICIAN ASSISTANT:   ASSISTANTS: none   ANESTHESIA:   general  EBL:  Total I/O In: 2000 [I.V.:2000] Out: 700 [Urine:500; Blood:200]  BLOOD ADMINISTERED:none  DRAINS: none   LOCAL MEDICATIONS USED:  Amount: 12 ml  SPECIMEN:  Source of Specimen:  pituitary gland  DISPOSITION OF SPECIMEN:  PATHOLOGY  COUNTS:  YES  TOURNIQUET:  * No tourniquets in log *  DICTATION: .Other Dictation: Dictation Number [id number]  PLAN OF CARE: Admit to inpatient   PATIENT DISPOSITION:  PACU - hemodynamically stable.   Delay start of Pharmacological VTE agent (>24hrs) due to surgical blood loss or risk of bleeding: yes

## 2016-03-25 ENCOUNTER — Encounter (HOSPITAL_COMMUNITY): Payer: Self-pay | Admitting: Neurosurgery

## 2016-03-25 LAB — BASIC METABOLIC PANEL
Anion gap: 12 (ref 5–15)
BUN: 14 mg/dL (ref 6–20)
CO2: 24 mmol/L (ref 22–32)
CREATININE: 1.36 mg/dL — AB (ref 0.61–1.24)
Calcium: 8.6 mg/dL — ABNORMAL LOW (ref 8.9–10.3)
Chloride: 102 mmol/L (ref 101–111)
GFR calc Af Amer: 57 mL/min — ABNORMAL LOW (ref >60)
GFR calc non Af Amer: 49 mL/min — ABNORMAL LOW (ref >60)
GLUCOSE: 141 mg/dL — AB (ref 65–99)
Potassium: 3.9 mmol/L (ref 3.5–5.1)
Sodium: 138 mmol/L (ref 135–145)

## 2016-03-25 NOTE — Progress Notes (Signed)
Postoperative day 1. Patient complains of his nasal packs. No other difficulties or problems. Patient states his vision is unchanged from preop.  Afebrile. Vital signs are stable. Urine output appropriate through the night. No symptoms of excessive thirst. Serum sodium normal. Patient is awake and alert. He is oriented and appropriate. His good visual acuity bilaterally. Extraocular movements are full. Facial movement and facial sensation normal bilaterally. Motor and sensory examination extremities normal.  Status post transsphenoidal debulking of suprasellar pituitary macroadenoma. No evidence of new visual problems or pituitary dysfunction. Continue ICU observation. Mobilize.

## 2016-03-25 NOTE — Progress Notes (Signed)
POD 1 AF VSS  Alert and Oriented O2 sats good Nasal packing intact with minimal drainage or bleeding Will plan on removing the nasal packing tomorrow pm Stable post op course.

## 2016-03-25 NOTE — Care Management Note (Signed)
Case Management Note  Patient Details  Name: Pedro Lee MRN: [id number] Date of Birth: 09/30/1940  Subjective/Objective:  Pt admitted on 03/24/16 s/p transseptal  transphenoidal resection of pituitary tumor.  PTA, pt independent, lives with spouse.                   Action/Plan: Will follow for discharge planning as pt progresses.    Expected Discharge Date:                  Expected Discharge Plan:  Home/Self Care  In-House Referral:     Discharge planning Services  CM Consult  Post Acute Care Choice:    Choice offered to:     DME Arranged:    DME Agency:     HH Arranged:    HH Agency:     Status of Service:  In process, will continue to follow  If discussed at Long Length of Stay Meetings, dates discussed:    Additional Comments:  Reinaldo Raddle, RN, BSN  Trauma/Neuro ICU Case Manager [phone]

## 2016-03-25 NOTE — Progress Notes (Signed)
Removing R radial aline created skin tears. The tubing of the aline  was attached to R AC IV drsg and the pink tape that was near IV removed a layer of skin in two places (one on each side of IV). Pt and wife are aware. Petroleum gauze applied to sites. Pt's arm is edematous. Explained edema can weaken the skin and suggested to pt and wife that we not use pink tape anymore just in case. Pt is planning on going home tomorrow. Instructed wife if she continues to use petroleum gauze as dressing to change daily to prevent drying out and adhering to skin.

## 2016-03-26 MED ORDER — PANTOPRAZOLE SODIUM 40 MG PO TBEC
40.0000 mg | DELAYED_RELEASE_TABLET | Freq: Every day | ORAL | Status: DC
  Administered 2016-03-26: 40 mg via ORAL
  Filled 2016-03-26: qty 1

## 2016-03-26 NOTE — Progress Notes (Signed)
POD 2 AF VSS Doing well Nasal packs removed today with minimal bleeding or drainage Stable post op course Possible d/c tomorrow Will Have patient follow up in my office in 1 week

## 2016-03-26 NOTE — Discharge Instructions (Signed)
Call Dr Lucia Gaskins for follow up appt in 1 week. Next Thurs or Fri    [phone]

## 2016-03-26 NOTE — Progress Notes (Signed)
Postop 2. Overall well. Patient states vision is improved. No other complaints.  Afebrile. Vital signs stable. Urine output appropriate. Awake and alert. Oriented and appropriate. Vision essentially intact bilaterally. Motor 5/5 bilaterally. Nasal dressing dry.  Pathology consistent with pituitary adenoma.  Progressing well. Mobilize today. Paxil later today. Probably home tomorrow.

## 2016-03-27 NOTE — Discharge Summary (Signed)
Physician Discharge Summary  Patient ID: Pedro Lee MRN: [id number] DOB/AGE: [birth year] 76 y.o.  Admit date: 03/24/2016 Discharge date: 03/27/2016  Admission Diagnoses:  Discharge Diagnoses:  Active Problems:   Pituitary macroadenoma with extrasellar extension [place])   Discharged Condition: good  Hospital Course: Patient admitted to the hospital where he underwent uncomplicated transsphenoidal resection of pituitary macroadenoma. Postoperative use doing well. He shows no evidence of endocrine dysfunction. He's having no visual symptoms. He states his vision is improved from preop. Patient up mobilizing without difficulty. No evidence of CSF leak. Ready for discharge home.  Consults:   Significant Diagnostic Studies:   Treatments:   Discharge Exam: Blood pressure 130/66, pulse (!) 49, temperature 97.3 F (36.3 C), temperature source Oral, resp. rate 20, height 6\' 3"  (1.905 m), weight (!) 140.4 kg (309 lb 8.4 oz), SpO2 100 %. Awake and alert. Oriented and appropriate. Cranial nerve function intact bilaterally. Motor and sensory function extremities well. Wound clean and dry. Chest and abdomen benign.  Disposition: 01-Home or Self Care   Allergies as of 03/27/2016      Reactions   Penicillins Rash      Medication List    TAKE these medications   allopurinol 300 MG tablet Commonly known as:  ZYLOPRIM Take 300 mg by mouth daily.   aspirin EC 81 MG tablet Take 81 mg by mouth daily.   benazepril-hydrochlorthiazide 20-25 MG tablet Commonly known as:  LOTENSIN HCT Take 1 tablet by mouth every morning.   colchicine 0.6 MG tablet Take 0.6 mg by mouth as needed (gout).   doxazosin 4 MG tablet Commonly known as:  CARDURA Take 4 mg by mouth daily.   metoprolol succinate 100 MG 24 hr tablet Commonly known as:  TOPROL-XL Take 100 mg by mouth daily.   montelukast 10 MG tablet Commonly known as:  SINGULAIR Take 10 mg by mouth daily.   OVER THE COUNTER MEDICATION Place  1 drop into both eyes as needed (for dry eyes). Crocodile Tears   tamsulosin 0.4 MG Caps capsule Commonly known as:  FLOMAX Take 0.4 mg by mouth daily.   vitamin B-12 500 MCG tablet Commonly known as:  CYANOCOBALAMIN Take 500 mcg by mouth daily.      Follow-up Information    Melony Overly, MD. Call in 1 week(s).   Specialty:  Otolaryngology Contact information: [place] [address] [phone]        Charlie Pitter, MD Follow up.   Specialty:  Neurosurgery Contact information: [address] [place] [address] [phone]           Signed: Charlie Pitter 03/27/2016, 9:52 AM

## 2016-03-27 NOTE — Care Management Important Message (Signed)
Important Message  Patient Details  Name: Pedro Lee MRN: [id number] Date of Birth: [birth year]   Medicare Important Message Given:       Orbie Pyo 03/27/2016, 1:41 PM

## 2016-03-27 NOTE — Progress Notes (Signed)
Pt D/C home, A&O with no new concerns, D/C education with Teach back done.

## 2016-06-03 ENCOUNTER — Other Ambulatory Visit: Payer: Self-pay | Admitting: Neurosurgery

## 2016-06-03 DIAGNOSIS — D352 Benign neoplasm of pituitary gland: Secondary | ICD-10-CM

## 2016-06-29 ENCOUNTER — Ambulatory Visit
Admission: RE | Admit: 2016-06-29 | Discharge: 2016-06-29 | Disposition: A | Discharge status: Home / Self Care | Payer: Medicare Other | Source: Ambulatory Visit | Attending: Neurosurgery | Admitting: Neurosurgery

## 2016-06-29 DIAGNOSIS — D352 Benign neoplasm of pituitary gland: Secondary | ICD-10-CM

## 2016-06-29 MED ORDER — GADOBENATE DIMEGLUMINE 529 MG/ML IV SOLN
10.0000 mL | Freq: Once | INTRAVENOUS | Status: AC | PRN
  Administered 2016-06-29: 10 mL via INTRAVENOUS

## 2016-07-11 NOTE — Addendum Note (Signed)
Addendum  created 07/11/16 0937 by Duane Boston, MD   Sign clinical note

## 2016-11-12 ENCOUNTER — Other Ambulatory Visit: Payer: Self-pay | Admitting: Neurosurgery

## 2016-11-12 DIAGNOSIS — D352 Benign neoplasm of pituitary gland: Secondary | ICD-10-CM

## 2016-12-22 ENCOUNTER — Ambulatory Visit
Admission: RE | Admit: 2016-12-22 | Discharge: 2016-12-22 | Disposition: A | Discharge status: Home / Self Care | Payer: Medicare Other | Source: Ambulatory Visit | Attending: Neurosurgery | Admitting: Neurosurgery

## 2016-12-22 DIAGNOSIS — D352 Benign neoplasm of pituitary gland: Secondary | ICD-10-CM

## 2016-12-22 MED ORDER — GADOBENATE DIMEGLUMINE 529 MG/ML IV SOLN
10.0000 mL | Freq: Once | INTRAVENOUS | Status: AC | PRN
  Administered 2016-12-22: 10 mL via INTRAVENOUS

## 2017-01-18 ENCOUNTER — Ambulatory Visit: Payer: Self-pay | Admitting: Podiatry

## 2017-01-25 ENCOUNTER — Encounter: Payer: Self-pay | Admitting: Podiatry

## 2017-01-25 ENCOUNTER — Ambulatory Visit (INDEPENDENT_AMBULATORY_CARE_PROVIDER_SITE_OTHER): Payer: Medicare Other

## 2017-01-25 ENCOUNTER — Ambulatory Visit: Payer: Medicare Other | Admitting: Podiatry

## 2017-01-25 VITALS — BP 155/88 | HR 53 | Ht 77.0 in | Wt 300.0 lb

## 2017-01-25 DIAGNOSIS — M2041 Other hammer toe(s) (acquired), right foot: Secondary | ICD-10-CM | POA: Diagnosis not present

## 2017-01-25 DIAGNOSIS — L989 Disorder of the skin and subcutaneous tissue, unspecified: Secondary | ICD-10-CM | POA: Diagnosis not present

## 2017-01-25 DIAGNOSIS — G5762 Lesion of plantar nerve, left lower limb: Secondary | ICD-10-CM | POA: Diagnosis not present

## 2017-01-25 DIAGNOSIS — M2042 Other hammer toe(s) (acquired), left foot: Secondary | ICD-10-CM | POA: Diagnosis not present

## 2017-01-25 MED ORDER — GABAPENTIN 100 MG PO CAPS: 100.0000 mg | ORAL_CAPSULE | Freq: Three times a day (TID) | ORAL | 3 refills | Status: DC

## 2017-01-25 NOTE — Progress Notes (Signed)
   Subjective:    Patient ID: Pedro Lee, male    DOB: [birth year], 76 y.o.   MRN: [id number]  HPI  Chief Complaint  Patient presents with  . Foot Pain    bilateral, acrossthe top and through the center of each foot.  . Callouses    Left 3rd toe and ball of foot  . Gout    history of gout in left great toe  . Peripheral Neuropathy    non diabetic with numbness and tingling across the bottom of his feet.  . Nail Problem    bilateral great toe nails thickened and discolored       Review of Systems  All other systems reviewed and are negative.      Objective:   Physical Exam        Assessment & Plan:

## 2017-01-27 NOTE — Progress Notes (Signed)
   HPI: 76 year old male presents to the clinic today as a new patient with a chief complaint of bilateral foot pain, left worse than right that has been ongoing for several months. He also reports calluses to the left foot, thickening of bilateral great toenails and neuropathy symptoms. There are no modifying factors noted. He has not done anything to treat the symptoms. Patient is here for further evaluation and treatment.   Past Medical History:  Diagnosis Date  . Allergy    sesonal  . Complication of anesthesia    HX OF ENLARGED PROSTATE AND UNABLE TO PASS WATER AFTER ANESTHESIA  . Dyspnea    with some exertion  . Dysrhythmia    HX ATRIAL FIBRILLATION - HAD ABLATION - NO LONGER HAS AF.  Marland Kitchen Essential hypertension, malignant   . Gout, unspecified   . Obesity, mild   . OSA (obstructive sleep apnea)    Mild - PT STATES HE DID NOT HAVE TO USE CPAP  . Osteoarthritis   . Osteoarthrosis, unspecified whether generalized or localized, unspecified site   . Other dyspnea and respiratory abnormality   . Prostate enlargement    DR. NESI IS PT'S UROLOGIST  . Pure hypercholesterolemia   . Renal insufficiency, mild    PT STATES NOT AWARE OF ANY KIDNEY PROBLEM     Physical Exam: General: The patient is alert and oriented x3 in no acute distress.  Dermatology: Hyperkeratotic lesions present on the left foot x 2. Pain on palpation with a central nucleated core noted.Skin is warm, dry and supple bilateral lower extremities. Negative for open lesions or macerations.  Vascular: Palpable pedal pulses bilaterally. No edema or erythema noted. Capillary refill within normal limits.  Neurological: Epicritic and protective threshold grossly intact bilaterally.   Musculoskeletal Exam: Sharp pain with palpation of the interspace and lateral compression of the metatarsal heads consistent with neuroma.  Positive Conley Canal sign with loadbearing of the forefoot. Hammertoe contracture deformity noted to digits  2-5 of the bilateral feet.  Radiographic Exam:  Normal osseous mineralization. Joint spaces preserved. No fracture/dislocation/boney destruction.    Assessment: 1.  Morton's neuroma interspace left foot 2. 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 Pre-ulcerative calluses left foot x 2   Plan of Care:  1. Patient was evaluated. X-Rays reviewed. 2. Injection of 0.5 mLs Celestone Soluspan injected into the neuroma of the left foot. 3. Excisional debridement of keratoic lesions using a chisel blade was performed without incident. Light dressing applied. 4. Prescription for gabapentin 100 mg provided to patient.  5. Return to clinic when necessary.     Edrick Kins, DPM [facility]  Dr. Edrick Kins, [address]                                        [place], [place] [address]                Office [phone]  Fax [phone]

## 2017-12-07 DIAGNOSIS — R0601 Orthopnea: Secondary | ICD-10-CM | POA: Insufficient documentation

## 2017-12-07 NOTE — Progress Notes (Deleted)
Cardiology Office Note:    Date:  12/07/2017   ID:  [name], DOB 11-21-1940, MRN [id number]  PCP:  Rogers Blocker, MD  Cardiologist:  No primary care provider on file.   Referring MD: Rogers Blocker, MD   No chief complaint on file. ***  History of Present Illness:    DARRIL PATRIARCA is a 77 y.o. male with a hx of hypertension who is being sent for cardiology consultation by Dr. Kevan Ny due to orthopnea and arm pain.  Past Medical History:  Diagnosis Date  . Allergy    sesonal  . Complication of anesthesia    HX OF ENLARGED PROSTATE AND UNABLE TO PASS WATER AFTER ANESTHESIA  . Dyspnea    with some exertion  . Dysrhythmia    HX ATRIAL FIBRILLATION - HAD ABLATION - NO LONGER HAS AF.  Marland Kitchen Essential hypertension, malignant   . Gout, unspecified   . Obesity, mild   . OSA (obstructive sleep apnea)    Mild - PT STATES HE DID NOT HAVE TO USE CPAP  . Osteoarthritis   . Osteoarthrosis, unspecified whether generalized or localized, unspecified site   . Other dyspnea and respiratory abnormality   . Prostate enlargement    DR. NESI IS PT'S UROLOGIST  . Pure hypercholesterolemia   . Renal insufficiency, mild    PT STATES NOT AWARE OF ANY KIDNEY PROBLEM    Past Surgical History:  Procedure Laterality Date  . COLONOSCOPY    . CRANIOTOMY N/A 03/24/2016   Procedure: Transphenoidal Resection of Tumor;  Surgeon: Earnie Larsson, MD;  Location: [facility];  Service: Neurosurgery;  Laterality: N/A;  Transphenoidal Resection of Tumor  . HEART ABLATION FOR AF  1998  . JOINT REPLACEMENT     LEFT TOTAL KNEE REPLACEMENT  . KNEE ARTHROPLASTY Left 2013ish  . LEFT KNEE ARTHROSCOPY    . TOTAL KNEE ARTHROPLASTY Right 05/30/2013   Procedure: RIGHT TOTAL KNEE ARTHROPLASTY;  Surgeon: Mauri Pole, MD;  Location: [facility];  Service: Orthopedics;  Laterality: Right;  . TRANSNASAL APPROACH N/A 03/24/2016   Procedure: TRANSNASAL APPROACH;  Surgeon: Rozetta Nunnery, MD;  Location: [facility] OR;  Service: ENT;   Laterality: N/A;  TRANSNASAL APPROACH    Current Medications: No outpatient medications have been marked as taking for the 12/08/17 encounter (Appointment) with Belva Crome, MD.     Allergies:   Penicillins   Social History   Socioeconomic History  . Marital status: Married    Spouse name: Not on file  . Number of children: Not on file  . Years of education: Not on file  . Highest education level: Not on file  Occupational History  . Not on file  Social Needs  . Financial resource strain: Not on file  . Food insecurity:    Worry: Not on file    Inability: Not on file  . Transportation needs:    Medical: Not on file    Non-medical: Not on file  Tobacco Use  . Smoking status: Never Smoker  . Smokeless tobacco: Never Used  Substance and Sexual Activity  . Alcohol use: No    Alcohol/week: 0.0 standard drinks  . Drug use: No  . Sexual activity: Not on file  Lifestyle  . Physical activity:    Days per week: Not on file    Minutes per session: Not on file  . Stress: Not on file  Relationships  . Social connections:    Talks on phone: Not  on file    Gets together: Not on file    Attends religious service: Not on file    Active member of club or organization: Not on file    Attends meetings of clubs or organizations: Not on file    Relationship status: Not on file  Other Topics Concern  . Not on file  Social History Narrative  . Not on file     Family History: The patient's ***family history is not on file.  ROS:   Please see the history of present illness.    *** All other systems reviewed and are negative.  EKGs/Labs/Other Studies Reviewed:    The following studies were reviewed today: ***  EKG:  EKG is *** ordered today.  The ekg ordered today demonstrates ***  Recent Labs: No results found for requested labs within last 8760 hours.  Recent Lipid Panel No results found for: CHOL, TRIG, HDL, CHOLHDL, VLDL, LDLCALC, LDLDIRECT  Physical Exam:     VS:  There were no vitals taken for this visit.    Wt Readings from Last 3 Encounters:  01/25/17 300 lb (136.1 kg)  03/24/16 (!) 309 lb 8.4 oz (140.4 kg)  03/17/16 (!) 307 lb 14.4 oz (139.7 kg)     GEN: *** Well nourished, well developed in no acute distress HEENT: Normal NECK: No JVD. LYMPHATICS: No lymphadenopathy CARDIAC: ***RRR, ***murmur, ***gallop, *** edema. VASCULAR: *** pulses. *** bruits. RESPIRATORY:  Clear to auscultation without rales, wheezing or rhonchi  ABDOMEN: Soft, non-tender, non-distended, No pulsatile mass, MUSCULOSKELETAL: No deformity  SKIN: Warm and dry NEUROLOGIC:  Alert and oriented x 3 PSYCHIATRIC:  Normal affect   ASSESSMENT:    1. Orthopnea   2. Pain in both upper extremities    PLAN:    In order of problems listed above:  1. ***   Medication Adjustments/Labs and Tests Ordered: Current medicines are reviewed at length with the patient today.  Concerns regarding medicines are outlined above.  No orders of the defined types were placed in this encounter.  No orders of the defined types were placed in this encounter.   There are no Patient Instructions on file for this visit.   Signed, Sinclair Grooms, MD  12/07/2017 6:57 PM    [place]

## 2017-12-08 ENCOUNTER — Ambulatory Visit: Payer: Medicare Other | Admitting: Interventional Cardiology

## 2017-12-12 NOTE — Progress Notes (Signed)
Cardiology Office Note:    Date:  12/13/2017   ID:  Pedro Lee, DOB 02-Jul-1940, MRN [id number]  PCP:  Rogers Blocker, MD  Cardiologist:  No primary care provider on file.   Referring MD: Rogers Blocker, MD   Chief Complaint  Patient presents with  . Advice Only    Arm numbness    History of Present Illness:    Pedro Lee is a 77 y.o. male with a hx of obesity, hypertension, and pituitary tumor who is having cardiology consultation at the request of Dr. Kevan Ny for arm numbness and DOE.  Prior history of AV nodal reentrant tachycardia 2004.  Pedro Lee has had dyspnea on exertion for several years.  This is been stable.  He does have lower extremity swelling that is controlled with compression stockings.  He denies orthopnea and PND.  Lower extremity swelling has not been worse than usual.  He and his wife have started walking and the dyspnea is about what it has been.  There is no chest pain with activity.  He has not had palpitations or syncope.  Cardiac catheterization 2004  Frequently at night when he lies on his back, his arms become numb and fingers will tingle.  Repositioning or sitting up helps to quickly relieve the sensation.  This sensation is not produced by physical activity and is never been associated with chest discomfort, indigestion, or other potential ischemic complaints.  Pedro Lee has not experienced any recurrence of SVT since ablation in 2004.  Past Medical History:  Diagnosis Date  . Allergy    sesonal  . Complication of anesthesia    HX OF ENLARGED PROSTATE AND UNABLE TO PASS WATER AFTER ANESTHESIA  . Dyspnea    with some exertion  . Dysrhythmia    HX ATRIAL FIBRILLATION - HAD ABLATION - NO LONGER HAS AF.  Marland Kitchen Essential hypertension, malignant   . Gout, unspecified   . Obesity, mild   . OSA (obstructive sleep apnea)    Mild - PT STATES HE DID NOT HAVE TO USE CPAP  . Osteoarthritis   . Osteoarthrosis, unspecified whether generalized or localized,  unspecified site   . Other dyspnea and respiratory abnormality   . Prostate enlargement    DR. NESI IS PT'S UROLOGIST  . Pure hypercholesterolemia   . Renal insufficiency, mild    PT STATES NOT AWARE OF ANY KIDNEY PROBLEM    Past Surgical History:  Procedure Laterality Date  . COLONOSCOPY    . CRANIOTOMY N/A 03/24/2016   Procedure: Transphenoidal Resection of Tumor;  Surgeon: Earnie Larsson, MD;  Location: [facility];  Service: Neurosurgery;  Laterality: N/A;  Transphenoidal Resection of Tumor  . HEART ABLATION FOR AF  1998  . JOINT REPLACEMENT     LEFT TOTAL KNEE REPLACEMENT  . KNEE ARTHROPLASTY Left 2013ish  . LEFT KNEE ARTHROSCOPY    . TOTAL KNEE ARTHROPLASTY Right 05/30/2013   Procedure: RIGHT TOTAL KNEE ARTHROPLASTY;  Surgeon: Mauri Pole, MD;  Location: [facility];  Service: Orthopedics;  Laterality: Right;  . TRANSNASAL APPROACH N/A 03/24/2016   Procedure: TRANSNASAL APPROACH;  Surgeon: Rozetta Nunnery, MD;  Location: [facility] OR;  Service: ENT;  Laterality: N/A;  TRANSNASAL APPROACH    Current Medications: Current Meds  Medication Sig  . allopurinol (ZYLOPRIM) 300 MG tablet Take 300 mg by mouth daily.    Marland Kitchen aspirin EC 81 MG tablet Take 81 mg by mouth daily.  . benazepril-hydrochlorthiazide (LOTENSIN HCT) 20-25 MG per tablet  Take 1 tablet by mouth every morning.  . colchicine 0.6 MG tablet Take 0.6 mg by mouth as needed (gout).   Marland Kitchen doxazosin (CARDURA) 4 MG tablet Take 4 mg by mouth daily.  Marland Kitchen FLUZONE HIGH-DOSE 0.5 ML injection TO BE ADMINISTERED BY PHARMACIST FOR IMMUNIZATION  . metoprolol (TOPROL-XL) 100 MG 24 hr tablet Take 100 mg by mouth daily.   . montelukast (SINGULAIR) 10 MG tablet Take 10 mg by mouth daily.  Marland Kitchen OVER THE COUNTER MEDICATION Place 1 drop into both eyes as needed (for dry eyes). Crocodile Tears  . rosuvastatin (CRESTOR) 10 MG tablet TAKE 1 TABLET BY MOUTH EVERYDAY AT BEDTIME  . tamsulosin (FLOMAX) 0.4 MG CAPS capsule Take 0.4 mg by mouth daily.     Allergies:    Penicillins   Social History   Socioeconomic History  . Marital status: Married    Spouse name: Not on file  . Number of children: Not on file  . Years of education: Not on file  . Highest education level: Not on file  Occupational History  . Not on file  Social Needs  . Financial resource strain: Not on file  . Food insecurity:    Worry: Not on file    Inability: Not on file  . Transportation needs:    Medical: Not on file    Non-medical: Not on file  Tobacco Use  . Smoking status: Never Smoker  . Smokeless tobacco: Never Used  Substance and Sexual Activity  . Alcohol use: No    Alcohol/week: 0.0 standard drinks  . Drug use: No  . Sexual activity: Not on file  Lifestyle  . Physical activity:    Days per week: Not on file    Minutes per session: Not on file  . Stress: Not on file  Relationships  . Social connections:    Talks on phone: Not on file    Gets together: Not on file    Attends religious service: Not on file    Active member of club or organization: Not on file    Attends meetings of clubs or organizations: Not on file    Relationship status: Not on file  Other Topics Concern  . Not on file  Social History Narrative  . Not on file     Family History: The patient's family history is not on file.  ROS:   Please see the history of present illness.    Snoring, occasional, excessive fatigue.  Difficulty with balance and ambulation.  All other systems reviewed and are negative.  EKGs/Labs/Other Studies Reviewed:    The following studies were reviewed today:   EKG:  EKG is  ordered today.  The ekg ordered today demonstrates sinus bradycardia 59 bpm.  Nonspecific T wave flattening with anterolateral T wave inversion.  Prominent voltage.  Recent Labs: No results found for requested labs within last 8760 hours.  Recent Lipid Panel No results found for: CHOL, TRIG, HDL, CHOLHDL, VLDL, LDLCALC, LDLDIRECT  Physical Exam:    VS:  BP 112/72   Pulse (!)  59   Ht 6\' 5"  (1.956 m)   Wt (!) 305 lb 3.2 oz (138.4 kg)   SpO2 97%   BMI 36.19 kg/m     Wt Readings from Last 3 Encounters:  12/13/17 (!) 305 lb 3.2 oz (138.4 kg)  01/25/17 300 lb (136.1 kg)  03/24/16 (!) 309 lb 8.4 oz (140.4 kg)     GEN: Obese.  Well nourished, well developed in no acute  distress HEENT: Normal NECK: No JVD. LYMPHATICS: No lymphadenopathy CARDIAC: RRR, no murmur, no gallop, no edema. VASCULAR: 2+ radial and carotid bilateral pulses.  No bruits. RESPIRATORY:  Clear to auscultation without rales, wheezing or rhonchi  ABDOMEN: Soft, non-tender, non-distended, No pulsatile mass, MUSCULOSKELETAL: No deformity  SKIN: Warm and dry NEUROLOGIC:  Alert and oriented x 3 PSYCHIATRIC:  Normal affect   ASSESSMENT:    1. DOE (dyspnea on exertion)   2. Arm numbness   3. Essential hypertension   4. Morbid obesity ([place])    PLAN:    In order of problems listed above:  1. Chronic, not associated with evidence of pulmonary congestion or central venous pressure overload.  Likely related to multifactorial etiology including obesity, deconditioning, and hypertension. 2. Arm discomfort is clearly positional related and likely neuropathic either in the neck or brachial plexus.  Needs imaging of his neck and shoulders to evaluate complaint. 3. Blood pressure is under excellent control on the current medical regimen which includes Lotensin HCT and Toprol-XL.  No changes recommended. 4. Needs to exercise more and decrease calories to lose weight.  Overall education and awareness concerning primary/secondary risk prevention was discussed in detail: LDL less than 70, hemoglobin A1c less than 7, blood pressure target less than 130/80 mmHg, >150 minutes of moderate aerobic activity per week, avoidance of smoking, weight control (via diet and exercise), and continued surveillance/management of/for obstructive sleep apnea.    Medication Adjustments/Labs and Tests Ordered: Current  medicines are reviewed at length with the patient today.  Concerns regarding medicines are outlined above.  Orders Placed This Encounter  Procedures  . EKG 12-Lead   No orders of the defined types were placed in this encounter.   Patient Instructions  Medication Instructions:  Your physician recommends that you continue on your current medications as directed. Please refer to the Current Medication list given to you today.  If you need a refill on your cardiac medications before your next appointment, please call your pharmacy.   Lab work: None If you have labs (blood work) drawn today and your tests are completely normal, you will receive your results only by: Marland Kitchen MyChart Message (if you have MyChart) OR . A paper copy in the mail If you have any lab test that is abnormal or we need to change your treatment, we will call you to review the results.  Testing/Procedures: None  Follow-Up: At [facility], you and your health needs are our priority.  As part of our continuing mission to provide you with exceptional heart care, we have created designated Provider Care Teams.  These Care Teams include your primary Cardiologist (physician) and Advanced Practice Providers (APPs -  Physician Assistants and Nurse Practitioners) who all work together to provide you with the care you need, when you need it. You will need a follow up appointment as needed with Dr. Tamala Julian.  Please call our office 2 months in advance to schedule this appointment.  You may see Dr. Tamala Julian or one of the following Advanced Practice Providers on your designated Care Team:   Truitt Merle, NP Cecilie Kicks, NP . Kathyrn Drown, NP  Any Other Special Instructions Will Be Listed Below (If Applicable).       Signed, Sinclair Grooms, MD  12/13/2017 3:36 PM    [place]

## 2017-12-13 ENCOUNTER — Encounter: Payer: Self-pay | Admitting: Interventional Cardiology

## 2017-12-13 ENCOUNTER — Ambulatory Visit: Payer: Medicare Other | Admitting: Interventional Cardiology

## 2017-12-13 VITALS — BP 112/72 | HR 59 | Ht 77.0 in | Wt 305.2 lb

## 2017-12-13 DIAGNOSIS — I10 Essential (primary) hypertension: Secondary | ICD-10-CM

## 2017-12-13 DIAGNOSIS — R200 Anesthesia of skin: Secondary | ICD-10-CM

## 2017-12-13 DIAGNOSIS — R0609 Other forms of dyspnea: Secondary | ICD-10-CM | POA: Diagnosis not present

## 2017-12-13 NOTE — Patient Instructions (Signed)
Medication Instructions:  Your physician recommends that you continue on your current medications as directed. Please refer to the Current Medication list given to you today.  If you need a refill on your cardiac medications before your next appointment, please call your pharmacy.   Lab work: None If you have labs (blood work) drawn today and your tests are completely normal, you will receive your results only by: . MyChart Message (if you have MyChart) OR . A paper copy in the mail If you have any lab test that is abnormal or we need to change your treatment, we will call you to review the results.  Testing/Procedures: None  Follow-Up: At [facility], you and your health needs are our priority.  As part of our continuing mission to provide you with exceptional heart care, we have created designated Provider Care Teams.  These Care Teams include your primary Cardiologist (physician) and Advanced Practice Providers (APPs -  Physician Assistants and Nurse Practitioners) who all work together to provide you with the care you need, when you need it. You will need a follow up appointment as needed with Dr. Negro.  Please call our office 2 months in advance to schedule this appointment.  You may see Dr. Sciortino or one of the following Advanced Practice Providers on your designated Care Team:   Lori Gerhardt, NP Laura Ingold, NP . Jill McDaniel, NP  Any Other Special Instructions Will Be Listed Below (If Applicable).    

## 2018-10-31 ENCOUNTER — Ambulatory Visit
Admission: RE | Admit: 2018-10-31 | Discharge: 2018-10-31 | Disposition: A | Discharge status: Home / Self Care | Payer: Medicare Other | Source: Ambulatory Visit | Attending: Internal Medicine | Admitting: Internal Medicine

## 2018-10-31 ENCOUNTER — Other Ambulatory Visit: Payer: Self-pay | Admitting: Internal Medicine

## 2018-10-31 DIAGNOSIS — R0789 Other chest pain: Secondary | ICD-10-CM

## 2018-11-10 ENCOUNTER — Other Ambulatory Visit: Payer: Self-pay | Admitting: Internal Medicine

## 2018-11-10 DIAGNOSIS — R269 Unspecified abnormalities of gait and mobility: Secondary | ICD-10-CM

## 2018-11-10 DIAGNOSIS — D352 Benign neoplasm of pituitary gland: Secondary | ICD-10-CM

## 2018-11-27 ENCOUNTER — Ambulatory Visit
Admission: RE | Admit: 2018-11-27 | Discharge: 2018-11-27 | Disposition: A | Discharge status: Home / Self Care | Payer: Medicare Other | Source: Ambulatory Visit | Attending: Internal Medicine | Admitting: Internal Medicine

## 2018-11-27 ENCOUNTER — Other Ambulatory Visit: Payer: Self-pay

## 2018-11-27 DIAGNOSIS — R269 Unspecified abnormalities of gait and mobility: Secondary | ICD-10-CM

## 2018-11-27 DIAGNOSIS — D352 Benign neoplasm of pituitary gland: Secondary | ICD-10-CM

## 2018-11-27 MED ORDER — GADOBENATE DIMEGLUMINE 529 MG/ML IV SOLN
15.0000 mL | Freq: Once | INTRAVENOUS | Status: AC | PRN
  Administered 2018-11-27: 09:00:00 15 mL via INTRAVENOUS

## 2019-02-20 ENCOUNTER — Ambulatory Visit
Admission: RE | Admit: 2019-02-20 | Discharge: 2019-02-20 | Disposition: A | Discharge status: Home / Self Care | Payer: 59 | Source: Ambulatory Visit | Attending: Internal Medicine | Admitting: Internal Medicine

## 2019-02-20 ENCOUNTER — Other Ambulatory Visit: Payer: Self-pay | Admitting: Internal Medicine

## 2019-02-20 DIAGNOSIS — R079 Chest pain, unspecified: Secondary | ICD-10-CM

## 2019-02-20 DIAGNOSIS — R0781 Pleurodynia: Secondary | ICD-10-CM

## 2019-03-17 DIAGNOSIS — R0683 Snoring: Secondary | ICD-10-CM | POA: Insufficient documentation

## 2019-05-03 ENCOUNTER — Ambulatory Visit
Admission: RE | Admit: 2019-05-03 | Discharge: 2019-05-03 | Disposition: A | Discharge status: Home / Self Care | Payer: 59 | Source: Ambulatory Visit | Attending: Internal Medicine | Admitting: Internal Medicine

## 2019-05-03 ENCOUNTER — Other Ambulatory Visit: Payer: Self-pay | Admitting: Internal Medicine

## 2019-05-03 DIAGNOSIS — Z09 Encounter for follow-up examination after completed treatment for conditions other than malignant neoplasm: Secondary | ICD-10-CM

## 2019-05-03 DIAGNOSIS — J189 Pneumonia, unspecified organism: Secondary | ICD-10-CM

## 2019-09-26 ENCOUNTER — Other Ambulatory Visit: Payer: Self-pay | Admitting: Internal Medicine

## 2019-09-26 DIAGNOSIS — R109 Unspecified abdominal pain: Secondary | ICD-10-CM

## 2019-09-27 ENCOUNTER — Other Ambulatory Visit: Payer: Self-pay | Admitting: Internal Medicine

## 2019-09-27 DIAGNOSIS — R1031 Right lower quadrant pain: Secondary | ICD-10-CM

## 2019-09-27 DIAGNOSIS — R1011 Right upper quadrant pain: Secondary | ICD-10-CM

## 2019-10-03 ENCOUNTER — Ambulatory Visit
Admission: RE | Admit: 2019-10-03 | Discharge: 2019-10-03 | Disposition: A | Discharge status: Home / Self Care | Payer: Medicare PPO | Source: Ambulatory Visit | Attending: Internal Medicine | Admitting: Internal Medicine

## 2019-10-03 DIAGNOSIS — R1011 Right upper quadrant pain: Secondary | ICD-10-CM

## 2019-10-03 DIAGNOSIS — R1031 Right lower quadrant pain: Secondary | ICD-10-CM

## 2019-10-11 NOTE — Progress Notes (Signed)
Cardiology Office Note    Date:  10/17/2019   ID:  Pedro [facility], DOB [birth year], MRN [id number]  PCP:  Pedro Blocker, MD  Cardiologist: Pedro Grooms, MD EPS: None  Chief Complaint  Patient presents with  . Shortness of Breath    History of Present Illness:  Pedro Lee is a 79 y.o. 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  Patient saw Dr. Tamala Lee 12/13/2017 for dyspnea on exertion that was chronic and felt secondary to deconditioning hypertension and obesity.  Arm discomfort was clearly positional, blood pressure controlled.  No changes made.  Patient comes in for f/u. BP running high the past few days 149/107 was the highest. PCP cut back on doxazosin from 4 mg to 1 mg daily a few months ago because of low BP and DOE-no dizziness. Patient has worsening dyspnea on exertion. Quit going to the gym because of shortness of breath. No heart racing. Father had an MI 38 but was a smoker. Siblings without heart disease. Had some swelling in legs yesterday but had bacon and on his feet a lot. Usually watches his salt closely.       Past Medical History:  Diagnosis Date  . Allergy    sesonal  . Complication of anesthesia    HX OF ENLARGED PROSTATE AND UNABLE TO PASS WATER AFTER ANESTHESIA  . Dyspnea    with some exertion  . Dysrhythmia    HX ATRIAL FIBRILLATION - HAD ABLATION - NO LONGER HAS AF.  Marland Kitchen Essential hypertension, malignant   . Gout, unspecified   . Obesity, mild   . OSA (obstructive sleep apnea)    Mild - PT STATES HE DID NOT HAVE TO USE CPAP  . Osteoarthritis   . Osteoarthrosis, unspecified whether generalized or localized, unspecified site   . Other dyspnea and respiratory abnormality   . Prostate enlargement    DR. NESI IS PT'S UROLOGIST  . Pure hypercholesterolemia   . Renal insufficiency, mild    PT STATES NOT AWARE OF ANY KIDNEY PROBLEM    Past Surgical History:   Procedure Laterality Date  . COLONOSCOPY    . CRANIOTOMY N/A 03/24/2016   Procedure: Transphenoidal Resection of Tumor;  Surgeon: Pedro Larsson, MD;  [facility]: [facility];  Service: Neurosurgery;  Laterality: N/A;  Transphenoidal Resection of Tumor  . HEART ABLATION FOR AF  1998  . JOINT REPLACEMENT     LEFT TOTAL KNEE REPLACEMENT  . KNEE ARTHROPLASTY Left 2013ish  . LEFT KNEE ARTHROSCOPY    . TOTAL KNEE ARTHROPLASTY Right 05/30/2013   Procedure: RIGHT TOTAL KNEE ARTHROPLASTY;  Surgeon: Pedro Pole, MD;  Location: [facility];  Service: Orthopedics;  Laterality: Right;  . TRANSNASAL APPROACH N/A 03/24/2016   Procedure: TRANSNASAL APPROACH;  Surgeon: Pedro Nunnery, MD;  Location: [facility];  Service: ENT;  Laterality: N/A;  TRANSNASAL APPROACH    Current Medications: Current Meds  Medication Sig  . allopurinol (ZYLOPRIM) 300 MG tablet Take 300 mg by mouth daily.    Marland Kitchen aspirin EC 81 MG tablet Take 81 mg by mouth daily.  . benazepril-hydrochlorthiazide (LOTENSIN HCT) 20-25 MG per tablet Take 1 tablet by mouth every morning.  . colchicine 0.6 MG tablet Take 0.6 mg by mouth as needed (gout).   Marland Kitchen doxazosin (CARDURA) 1 MG tablet Take 1 mg by mouth daily.  . metoprolol (TOPROL-XL) 100 MG 24 hr tablet Take 100 mg  by mouth daily.   . montelukast (SINGULAIR) 10 MG tablet Take 10 mg by mouth daily.  Marland Kitchen OVER THE COUNTER MEDICATION Place 1 drop into both eyes as needed (for dry eyes). Crocodile Tears  . rosuvastatin (CRESTOR) 10 MG tablet TAKE 1 TABLET BY MOUTH EVERYDAY AT BEDTIME  . tamsulosin (FLOMAX) 0.4 MG CAPS capsule Take 0.4 mg by mouth daily.  . [DISCONTINUED] doxazosin (CARDURA) 4 MG tablet Take 4 mg by mouth daily.     Allergies:   Penicillins   Social History   Socioeconomic History  . Marital status: Married    Spouse name: Not on file  . Number of children: Not on file  . Years of education: Not on file  . Highest education level: Not on file  Occupational History  . Not on file   Tobacco Use  . Smoking status: Never Smoker  . Smokeless tobacco: Never Used  Substance and Sexual Activity  . Alcohol use: No    Alcohol/week: 0.0 standard drinks  . Drug use: No  . Sexual activity: Not on file  Other Topics Concern  . Not on file  Social History Narrative  . Not on file   Social Determinants of Health   Financial Resource Strain:   . Difficulty of Paying Living Expenses: Not on file  Food Insecurity:   . Worried About Charity fundraiser in the Last Year: Not on file  . Ran Out of Food in the Last Year: Not on file  Transportation Needs:   . Lack of Transportation (Medical): Not on file  . Lack of Transportation (Non-Medical): Not on file  Physical Activity:   . Days of Exercise per Week: Not on file  . Minutes of Exercise per Session: Not on file  Stress:   . Feeling of Stress : Not on file  Social Connections:   . Frequency of Communication with Friends and Family: Not on file  . Frequency of Social Gatherings with Friends and Family: Not on file  . Attends Religious Services: Not on file  . Active Member of Clubs or Organizations: Not on file  . Attends Archivist Meetings: Not on file  . Marital Status: Not on file     Family History:  The patient's family history includes Heart disease (age of onset: 23) in his father.   ROS:   Please see the history of present illness.    ROS All other systems reviewed and are negative.   PHYSICAL EXAM:   VS:  BP (!) 158/82   Pulse (!) 54   Ht 6\' 4"  (1.93 m)   Wt (!) 304 lb (137.9 kg)   BMI 37.00 kg/m   Physical Exam  GEN: Well nourished, well developed, in no acute distress  Neck: no JVD, carotid bruits, or masses Cardiac:RRR; no murmurs, rubs, or gallops  Respiratory:  clear to auscultation bilaterally, normal work of breathing GI: soft, nontender, nondistended, + BS Ext: without cyanosis, clubbing, or edema, Good distal pulses bilaterally Neuro:  Alert and Oriented x 3 Psych: euthymic  mood, full affect  Wt Readings from Last 3 Encounters:  10/17/19 (!) 304 lb (137.9 kg)  12/13/17 (!) 305 lb 3.2 oz (138.4 kg)  01/25/17 300 lb (136.1 kg)      Studies/Labs Reviewed:   EKG:  EKG is  ordered today.  The ekg ordered today demonstrates NSR with lateral TWI similar to prior EKG's. No changes.  Recent Labs: No results found for requested labs within last  8760 hours.   Lipid Panel No results found for: CHOL, TRIG, HDL, CHOLHDL, VLDL, LDLCALC, LDLDIRECT  Additional studies/ records that were reviewed today include:  2D echo 2004LEFT VENTRICLE:   -  Left ventricular size was normal.   -  Overall left ventricular systolic function was at the lower         limits of normal.   -  Left ventricular ejection fraction was estimated , range being 50         % to 55 %.   -  There was no diagnostic evidence of left ventricular regional         wall motion abnormalities.   -  Left ventricular wall thickness was mildly increased.   AORTIC VALVE:   -  The aortic valve was grossly normal.   AORTA:   -  The aorta was grossly normal.   MITRAL VALVE:   -  The mitral valve was grossly normal.    Doppler interpretation(s):   -  There was trivial mitral valvular regurgitation.   LEFT ATRIUM:   -  Left atrial size was at the upper limits of normal.   RIGHT VENTRICLE:   -  The right ventricle was grossly normal.   PULMONIC VALVE:   -  The pulmonic valve was grossly normal.   TRICUSPID VALVE:   -  The tricuspid valve was grossly normal.    Doppler interpretation(s):   -  There was trivial tricuspid valvular regurgitation.   PULMONARY ARTERY:   -  The pulmonary artery was grossly normal.   RIGHT ATRIUM:   -  The right atrium was grossly normal.   SYSTEMIC VEINS:   -  The inferior vena cava was normal.   PERICARDIUM:   -  There was no pericardial effusion.    ---------------------------------------------------------------   SUMMARY   -  Overall left ventricular systolic  function was at the lower         limits of normal. Left ventricular ejection fraction was         estimated , range being 50 % to 55 %. There was no diagnostic         evidence of left ventricular regional wall motion         abnormalities. Left ventricular wall thickness was mildly         increased.   -  Left atrial size was at the upper limits of normal.    ---------------------------------------------------------------      ASSESSMENT:    1. Dyspnea on exertion   2. H/O atrioventricular nodal ablation   3. Essential hypertension   4. Morbid obesity ([place])      PLAN:  In order of problems listed above:  Chronic dyspnea on exertion has worsened over the past few years. No regular exercise and BP running high. With family history of CAD will update echo and lexiscan. Could be due to deconditioning and obesity.   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 Running high at home and here today. Increase to 2mg  daily and f/u in 3-4 weeks  Obesity exercise and weight loss recommended.    Medication Adjustments/Labs and Tests Ordered: Current medicines are reviewed at length with the patient today.  Concerns regarding medicines are outlined above.  Medication changes, Labs and Tests ordered today are listed in the Patient Instructions below. Patient Instructions  Medication Instructions:  Your physician has recommended you make the following change in your medication:   INCREASE Cardura to 2mg  daily  *If you need a refill on your cardiac medications before your next appointment, please call your pharmacy*   Lab Work: None  If you have labs (blood work) drawn today and your tests are completely normal, you will receive your results only by: Marland Kitchen MyChart Message (if you have MyChart) OR . A paper copy in the mail If you have any lab test that is abnormal or we  need to change your treatment, we will call you to review the results.   Testing/Procedures: Your physician has requested that you have a lexiscan myoview. For further information please visit [email or web address]. Please follow instruction sheet, as given.  Your physician has requested that you have an echocardiogram. Echocardiography is a painless test that uses sound waves to create images of your heart. It provides your doctor with information about the size and shape of your heart and how well your heart's chambers and valves are working. This procedure takes approximately one hour. There are no restrictions for this procedure.     Follow-Up: At [facility], you and your health needs are our priority.  As part of our continuing mission to provide you with exceptional heart care, we have created designated Provider Care Teams.  These Care Teams include your primary Cardiologist (physician) and Advanced Practice Providers (APPs -  Physician Assistants and Nurse Practitioners) who all work together to provide you with the care you need, when you need it.  We recommend signing up for the patient portal called "MyChart".  Sign up information is provided on this After Visit Summary.  MyChart is used to connect with patients for Virtual Visits (Telemedicine).  Patients are able to view lab/test results, encounter notes, upcoming appointments, etc.  Non-urgent messages can be sent to your provider as well.   To learn more about what you can do with MyChart, go to [email or web address].    Your next appointment:   11/15/2019  The format for your next appointment:   In Person  Provider:   Ermalinda Barrios, PA-C   Other Instructions Your provider recommends that you maintain 150 minutes per week of moderate aerobic activity.  Two Gram Sodium Diet 2000 mg  What is Sodium? Sodium is a mineral found naturally in many foods. The most significant source of sodium in the diet is table salt, which  is about 40% sodium.  Processed, convenience, and preserved foods also contain a large amount of sodium.  The body needs only 500 mg of sodium daily to function,  A normal diet provides more than enough sodium even if you do not use salt.  Why Limit Sodium? A build up of sodium in the body can cause thirst, increased blood pressure, shortness of breath, and water retention.  Decreasing sodium in the diet can reduce edema and risk of heart attack or stroke associated with high blood pressure.  Keep in mind that there are many other factors involved in these health problems.  Heredity, obesity, lack of exercise, cigarette smoking, stress and what you eat all play a role.  General Guidelines:  Do not add salt at the table or in cooking.  One teaspoon of salt contains over 2 grams of sodium.  Read food labels  Avoid processed and convenience foods  Ask your dietitian before eating any foods not dicussed in the menu planning guidelines  Consult your physician if you wish to  use a salt substitute or a sodium containing medication such as antacids.  Limit milk and milk products to 16 oz (2 cups) per day.  Shopping Hints:  READ LABELS!! "Dietetic" does not necessarily mean low sodium.  Salt and other sodium ingredients are often added to foods during processing.   Menu Planning Guidelines Food Group Choose More Often Avoid  Beverages (see also the milk group All fruit juices, low-sodium, salt-free vegetables juices, low-sodium carbonated beverages Regular vegetable or tomato juices, commercially softened water used for drinking or cooking  Breads and Cereals Enriched white, wheat, rye and pumpernickel bread, hard rolls and dinner rolls; muffins, cornbread and waffles; most dry cereals, cooked cereal without added salt; unsalted crackers and breadsticks; low sodium or homemade bread crumbs Bread, rolls and crackers with salted tops; quick breads; instant hot cereals; pancakes; commercial bread  stuffing; self-rising flower and biscuit mixes; regular bread crumbs or cracker crumbs  Desserts and Sweets Desserts and sweets mad with mild should be within allowance Instant pudding mixes and cake mixes  Fats Butter or margarine; vegetable oils; unsalted salad dressings, regular salad dressings limited to 1 Tbs; light, sour and heavy cream Regular salad dressings containing bacon fat, bacon bits, and salt pork; snack dips made with instant soup mixes or processed cheese; salted nuts  Fruits Most fresh, frozen and canned fruits Fruits processed with salt or sodium-containing ingredient (some dried fruits are processed with sodium sulfites        Vegetables Fresh, frozen vegetables and low- sodium canned vegetables Regular canned vegetables, sauerkraut, pickled vegetables, and others prepared in brine; frozen vegetables in sauces; vegetables seasoned with ham, bacon or salt pork  Condiments, Sauces, Miscellaneous  Salt substitute with physician's approval; pepper, herbs, spices; vinegar, lemon or lime juice; hot pepper sauce; garlic powder, onion powder, low sodium soy sauce (1 Tbs.); low sodium condiments (ketchup, chili sauce, mustard) in limited amounts (1 tsp.) fresh ground horseradish; unsalted tortilla chips, pretzels, potato chips, popcorn, salsa (1/4 cup) Any seasoning made with salt including garlic salt, celery salt, onion salt, and seasoned salt; sea salt, rock salt, kosher salt; meat tenderizers; monosodium glutamate; mustard, regular soy sauce, barbecue, sauce, chili sauce, teriyaki sauce, steak sauce, Worcestershire sauce, and most flavored vinegars; canned gravy and mixes; regular condiments; salted snack foods, olives, picles, relish, horseradish sauce, catsup   Food preparation: Try these seasonings Meats:    Pork Sage, onion Serve with applesauce  Chicken Poultry seasoning, thyme, parsley Serve with cranberry sauce  Lamb Curry powder, rosemary, garlic, thyme Serve with mint sauce  or jelly  Veal Marjoram, basil Serve with current jelly, cranberry sauce  Beef Pepper, bay leaf Serve with dry mustard, unsalted chive butter  Fish [place] leaf, dill Serve with unsalted lemon butter, unsalted parsley butter  Vegetables:    Asparagus Lemon juice   Broccoli Lemon juice   Carrots Mustard dressing parsley, mint, nutmeg, glazed with unsalted butter and sugar   Green beans Marjoram, lemon juice, nutmeg,dill seed   Tomatoes Basil, marjoram, onion   Spice /blend for [employer]" 4 tsp ground thyme 1 tsp ground sage 3 tsp ground rosemary 4 tsp ground marjoram   Test your knowledge 1. A product that says "Salt Free" may still contain sodium. True or False 2. Garlic Powder and Hot Pepper Sauce an be used as alternative seasonings.True or False 3. Processed foods have more sodium than fresh foods.  True or False 4. Canned Vegetables have less sodium than froze True or False  WAYS  TO DECREASE YOUR SODIUM INTAKE 1. Avoid the use of added salt in cooking and at the table.  Table salt (and other prepared seasonings which contain salt) is probably one of the greatest sources of sodium in the diet.  Unsalted foods can gain flavor from the sweet, sour, and butter taste sensations of herbs and spices.  Instead of using salt for seasoning, try the following seasonings with the foods listed.  Remember: how you use them to enhance natural food flavors is limited only by your creativity... Allspice-Meat, fish, eggs, fruit, peas, red and yellow vegetables Almond Extract-Fruit baked goods Anise Seed-Sweet breads, fruit, carrots, beets, cottage cheese, cookies (tastes like licorice) Basil-Meat, fish, eggs, vegetables, rice, vegetables salads, soups, sauces Bay Leaf-Meat, fish, stews, poultry Burnet-Salad, vegetables (cucumber-like flavor) Caraway Seed-Bread, cookies, cottage cheese, meat, vegetables, cheese, rice Cardamon-Baked goods, fruit, soups Celery Powder or seed-Salads, salad dressings,  sauces, meatloaf, soup, bread.Do not use  celery salt Chervil-Meats, salads, fish, eggs, vegetables, cottage cheese (parsley-like flavor) Chili Power-Meatloaf, chicken cheese, corn, eggplant, egg dishes Chives-Salads cottage cheese, egg dishes, soups, vegetables, sauces Cilantro-Salsa, casseroles Cinnamon-Baked goods, fruit, pork, lamb, chicken, carrots Cloves-Fruit, baked goods, fish, pot roast, green beans, beets, carrots Coriander-Pastry, cookies, meat, salads, cheese (lemon-orange flavor) Cumin-Meatloaf, fish,cheese, eggs, cabbage,fruit pie (caraway flavor) [employer], fruit, eggs, fish, poultry, cottage cheese, vegetables Dill Seed-Meat, cottage cheese, poultry, vegetables, fish, salads, bread Fennel Seed-Bread, cookies, apples, pork, eggs, fish, beets, cabbage, cheese, Licorice-like flavor Garlic-(buds or powder) Salads, meat, poultry, fish, bread, butter, vegetables, potatoes.Do not  use garlic salt Ginger-Fruit, vegetables, baked goods, meat, fish, poultry Horseradish Root-Meet, vegetables, butter Lemon Juice or Extract-Vegetables, fruit, tea, baked goods, fish salads Mace-Baked goods fruit, vegetables, fish, poultry (taste like nutmeg) Maple Extract-Syrups Marjoram-Meat, chicken, fish, vegetables, breads, green salads (taste like Sage) Mint-Tea, lamb, sherbet, vegetables, desserts, carrots, cabbage Mustard, Dry or Seed-Cheese, eggs, meats, vegetables, poultry Nutmeg-Baked goods, fruit, chicken, eggs, vegetables, desserts Onion Powder-Meat, fish, poultry, vegetables, cheese, eggs, bread, rice salads (Do not use   Onion salt) Orange Extract-Desserts, baked goods Oregano-Pasta, eggs, cheese, onions, pork, lamb, fish, chicken, vegetables, green salads Paprika-Meat, fish, poultry, eggs, cheese, vegetables Parsley Flakes-Butter, vegetables, meat fish, poultry, eggs, bread, salads (certain forms may   Contain sodium Pepper-Meat fish, poultry, vegetables, eggs Peppermint  Extract-Desserts, baked goods Poppy Seed-Eggs, bread, cheese, fruit dressings, baked goods, noodles, vegetables, cottage  [employer], poultry, meat, fish, cauliflower, turnips,eggs bread Saffron-Rice, bread, veal, chicken, fish, eggs Sage-Meat, fish, poultry, onions, eggplant, tomateos, pork, stews Savory-Eggs, salads, poultry, meat, rice, vegetables, soups, pork Tarragon-Meat, poultry, fish, eggs, butter, vegetables (licorice-like flavor)  Thyme-Meat, poultry, fish, eggs, vegetables, (clover-like flavor), sauces, soups Tumeric-Salads, butter, eggs, fish, rice, vegetables (saffron-like flavor) Vanilla Extract-Baked goods, candy Vinegar-Salads, vegetables, meat marinades Walnut Extract-baked goods, candy  2. Choose your Foods Wisely   The following is a list of foods to avoid which are high in sodium:  Meats-Avoid all smoked, canned, salt cured, dried and kosher meat and fish as well as Anchovies   [employer] meats:Bologna, Liverwurst, Pastrami Canned meat or fish  Marinated herring Caviar    Pepperoni Corned Beef   Pizza Dried chipped beef  Salami Frozen breaded fish or meat Salt pork Frankfurters or hot dogs  Sardines Gefilte fish   Sausage Ham (boiled ham, Proscuitto Smoked butt    spiced ham)   Spam      TV Dinners Vegetables Canned vegetables (Regular) Relish Canned mushrooms  Sauerkraut Olives  Tomato juice Pickles  Bakery and Dessert Products Canned puddings  Cream pies Cheesecake   Decorated cakes Cookies  Beverages/Juices Tomato juice, regular  Gatorade   V-8 vegetable juice, regular  Breads and Cereals Biscuit mixes   Salted potato chips, corn chips, pretzels Bread stuffing mixes  Salted crackers and rolls Pancake and waffle mixes Self-rising flour  Seasonings Accent    Meat sauces Barbecue sauce  Meat tenderizer Catsup    Monosodium glutamate (MSG) Celery salt   Onion salt Chili sauce   Prepared  mustard Garlic salt   Salt, seasoned salt, sea salt Gravy mixes   Soy sauce Horseradish   Steak sauce Ketchup   Tartar sauce Lite salt    Teriyaki sauce Marinade mixes   Worcestershire sauce  Others Baking powder   Cocoa and cocoa mixes Baking soda   Commercial casserole mixes Candy-caramels, chocolate  Dehydrated soups    Bars, fudge,nougats  Instant rice and pasta mixes Canned broth or soup  Maraschino cherries Cheese, aged and processed cheese and cheese spreads  Learning Assessment Quiz  Indicated T (for True) or F (for False) for each of the following statements:  1. _____ Fresh fruits and vegetables and unprocessed grains are generally low in sodium 2. _____ Water may contain a considerable amount of sodium, depending on the source 3. _____ You can always tell if a food is high in sodium by tasting it 4. _____ Certain laxatives my be high in sodium and should be avoided unless prescribed   by a physician or pharmacist 5. _____ Salt substitutes may be used freely by anyone on a sodium restricted diet 6. _____ Sodium is present in table salt, food additives and as a natural component of   most foods 7. _____ Table salt is approximately 90% sodium 8. _____ Limiting sodium intake may help prevent excess fluid accumulation in the body 9. _____ On a sodium-restricted diet, seasonings such as bouillon soy sauce, and    cooking wine should be used in place of table salt 10. _____ On an ingredient list, a product which lists monosodium glutamate as the first   ingredient is an appropriate food to include on a low sodium diet  Circle the best answer(s) to the following statements (Hint: there may be more than one correct answer)  11. On a low-sodium diet, some acceptable snack items are:    A. Olives  F. Bean dip   K. Grapefruit juice    B. Salted Pretzels G. Commercial Popcorn   L. Canned peaches    C. Carrot Sticks  H. Bouillon   M. Unsalted nuts   D. [place] fries  I. Peanut  butter crackers N. Salami   E. Sweet pickles J. Tomato Juice   O. Pizza  12.  Seasonings that may be used freely on a reduced - sodium diet include   A. Lemon wedges F.Monosodium glutamate K. Celery seed    B.Soysauce   G. Pepper   L. Mustard powder   C. Sea salt  H. Cooking wine  M. Onion flakes   D. Vinegar  E. Prepared horseradish N. Salsa   E. Sage   J. Worcestershire sauce  O. Chutney      Signed, Ermalinda Barrios, PA-C  10/17/2019 8:25 AM    [facility] [address], [place], [place]  [address] Phone: [phone]; Fax: [phone]

## 2019-10-17 ENCOUNTER — Ambulatory Visit: Payer: Medicare PPO | Admitting: Physician Assistant

## 2019-10-17 ENCOUNTER — Encounter: Payer: Self-pay | Admitting: Physician Assistant

## 2019-10-17 ENCOUNTER — Other Ambulatory Visit: Payer: Self-pay

## 2019-10-17 VITALS — BP 158/82 | HR 54 | Ht 76.0 in | Wt 304.0 lb

## 2019-10-17 DIAGNOSIS — Z9889 Other specified postprocedural states: Secondary | ICD-10-CM | POA: Diagnosis not present

## 2019-10-17 DIAGNOSIS — R0609 Other forms of dyspnea: Secondary | ICD-10-CM

## 2019-10-17 DIAGNOSIS — I10 Essential (primary) hypertension: Secondary | ICD-10-CM

## 2019-10-17 DIAGNOSIS — R0600 Dyspnea, unspecified: Secondary | ICD-10-CM | POA: Diagnosis not present

## 2019-10-17 MED ORDER — DOXAZOSIN MESYLATE 1 MG PO TABS: 2.0000 mg | ORAL_TABLET | Freq: Every day | ORAL | 1 refills | Status: DC

## 2019-10-17 NOTE — Addendum Note (Signed)
Addended by: Carylon Perches on: 10/17/2019 08:39 AM   Modules accepted: Orders

## 2019-10-17 NOTE — Patient Instructions (Signed)
Medication Instructions:  Your physician has recommended you make the following change in your medication:   INCREASE Cardura to 2mg  daily  *If you need a refill on your cardiac medications before your next appointment, please call your pharmacy*   Lab Work: None  If you have labs (blood work) drawn today and your tests are completely normal, you will receive your results only by: Marland Kitchen MyChart Message (if you have MyChart) OR . A paper copy in the mail If you have any lab test that is abnormal or we need to change your treatment, we will call you to review the results.   Testing/Procedures: Your physician has requested that you have a lexiscan myoview. For further information please visit [email or web address]. Please follow instruction sheet, as given.  Your physician has requested that you have an echocardiogram. Echocardiography is a painless test that uses sound waves to create images of your heart. It provides your doctor with information about the size and shape of your heart and how well your heart's chambers and valves are working. This procedure takes approximately one hour. There are no restrictions for this procedure.     Follow-Up: At [facility], you and your health needs are our priority.  As part of our continuing mission to provide you with exceptional heart care, we have created designated Provider Care Teams.  These Care Teams include your primary Cardiologist (physician) and Advanced Practice Providers (APPs -  Physician Assistants and Nurse Practitioners) who all work together to provide you with the care you need, when you need it.  We recommend signing up for the patient portal called "MyChart".  Sign up information is provided on this After Visit Summary.  MyChart is used to connect with patients for Virtual Visits (Telemedicine).  Patients are able to view lab/test results, encounter notes, upcoming appointments, etc.  Non-urgent messages can be sent to your provider  as well.   To learn more about what you can do with MyChart, go to [email or web address].    Your next appointment:   11/15/2019  The format for your next appointment:   In Person  Provider:   Ermalinda Barrios, PA-C   Other Instructions Your provider recommends that you maintain 150 minutes per week of moderate aerobic activity.  Two Gram Sodium Diet 2000 mg  What is Sodium? Sodium is a mineral found naturally in many foods. The most significant source of sodium in the diet is table salt, which is about 40% sodium.  Processed, convenience, and preserved foods also contain a large amount of sodium.  The body needs only 500 mg of sodium daily to function,  A normal diet provides more than enough sodium even if you do not use salt.  Why Limit Sodium? A build up of sodium in the body can cause thirst, increased blood pressure, shortness of breath, and water retention.  Decreasing sodium in the diet can reduce edema and risk of heart attack or stroke associated with high blood pressure.  Keep in mind that there are many other factors involved in these health problems.  Heredity, obesity, lack of exercise, cigarette smoking, stress and what you eat all play a role.  General Guidelines:  Do not add salt at the table or in cooking.  One teaspoon of salt contains over 2 grams of sodium.  Read food labels  Avoid processed and convenience foods  Ask your dietitian before eating any foods not dicussed in the menu planning guidelines  Consult your physician if you wish to use  a salt substitute or a sodium containing medication such as antacids.  Limit milk and milk products to 16 oz (2 cups) per day.  Shopping Hints:  READ LABELS!! "Dietetic" does not necessarily mean low sodium.  Salt and other sodium ingredients are often added to foods during processing.   Menu Planning Guidelines Food Group Choose More Often Avoid  Beverages (see also the milk group All fruit juices, low-sodium,  salt-free vegetables juices, low-sodium carbonated beverages Regular vegetable or tomato juices, commercially softened water used for drinking or cooking  Breads and Cereals Enriched white, wheat, rye and pumpernickel bread, hard rolls and dinner rolls; muffins, cornbread and waffles; most dry cereals, cooked cereal without added salt; unsalted crackers and breadsticks; low sodium or homemade bread crumbs Bread, rolls and crackers with salted tops; quick breads; instant hot cereals; pancakes; commercial bread stuffing; self-rising flower and biscuit mixes; regular bread crumbs or cracker crumbs  Desserts and Sweets Desserts and sweets mad with mild should be within allowance Instant pudding mixes and cake mixes  Fats Butter or margarine; vegetable oils; unsalted salad dressings, regular salad dressings limited to 1 Tbs; light, sour and heavy cream Regular salad dressings containing bacon fat, bacon bits, and salt pork; snack dips made with instant soup mixes or processed cheese; salted nuts  Fruits Most fresh, frozen and canned fruits Fruits processed with salt or sodium-containing ingredient (some dried fruits are processed with sodium sulfites        Vegetables Fresh, frozen vegetables and low- sodium canned vegetables Regular canned vegetables, sauerkraut, pickled vegetables, and others prepared in brine; frozen vegetables in sauces; vegetables seasoned with ham, bacon or salt pork  Condiments, Sauces, Miscellaneous  Salt substitute with physician's approval; pepper, herbs, spices; vinegar, lemon or lime juice; hot pepper sauce; garlic powder, onion powder, low sodium soy sauce (1 Tbs.); low sodium condiments (ketchup, chili sauce, mustard) in limited amounts (1 tsp.) fresh ground horseradish; unsalted tortilla chips, pretzels, potato chips, popcorn, salsa (1/4 cup) Any seasoning made with salt including garlic salt, celery salt, onion salt, and seasoned salt; sea salt, rock salt, kosher salt; meat  tenderizers; monosodium glutamate; mustard, regular soy sauce, barbecue, sauce, chili sauce, teriyaki sauce, steak sauce, Worcestershire sauce, and most flavored vinegars; canned gravy and mixes; regular condiments; salted snack foods, olives, picles, relish, horseradish sauce, catsup   Food preparation: Try these seasonings Meats:    Pork Sage, onion Serve with applesauce  Chicken Poultry seasoning, thyme, parsley Serve with cranberry sauce  Lamb Curry powder, rosemary, garlic, thyme Serve with mint sauce or jelly  Veal Marjoram, basil Serve with current jelly, cranberry sauce  Beef Pepper, bay leaf Serve with dry mustard, unsalted chive butter  Fish [place] leaf, dill Serve with unsalted lemon butter, unsalted parsley butter  Vegetables:    Asparagus Lemon juice   Broccoli Lemon juice   Carrots Mustard dressing parsley, mint, nutmeg, glazed with unsalted butter and sugar   Green beans Marjoram, lemon juice, nutmeg,dill seed   Tomatoes Basil, marjoram, onion   Spice /blend for [employer]" 4 tsp ground thyme 1 tsp ground sage 3 tsp ground rosemary 4 tsp ground marjoram   Test your knowledge 1. A product that says "Salt Free" may still contain sodium. True or False 2. Garlic Powder and Hot Pepper Sauce an be used as alternative seasonings.True or False 3. Processed foods have more sodium than fresh foods.  True or False 4. Canned Vegetables have less sodium than froze True or False  WAYS TO  DECREASE YOUR SODIUM INTAKE 1. Avoid the use of added salt in cooking and at the table.  Table salt (and other prepared seasonings which contain salt) is probably one of the greatest sources of sodium in the diet.  Unsalted foods can gain flavor from the sweet, sour, and butter taste sensations of herbs and spices.  Instead of using salt for seasoning, try the following seasonings with the foods listed.  Remember: how you use them to enhance natural food flavors is limited only by your  creativity... Allspice-Meat, fish, eggs, fruit, peas, red and yellow vegetables Almond Extract-Fruit baked goods Anise Seed-Sweet breads, fruit, carrots, beets, cottage cheese, cookies (tastes like licorice) Basil-Meat, fish, eggs, vegetables, rice, vegetables salads, soups, sauces Bay Leaf-Meat, fish, stews, poultry Burnet-Salad, vegetables (cucumber-like flavor) Caraway Seed-Bread, cookies, cottage cheese, meat, vegetables, cheese, rice Cardamon-Baked goods, fruit, soups Celery Powder or seed-Salads, salad dressings, sauces, meatloaf, soup, bread.Do not use  celery salt Chervil-Meats, salads, fish, eggs, vegetables, cottage cheese (parsley-like flavor) Chili Power-Meatloaf, chicken cheese, corn, eggplant, egg dishes Chives-Salads cottage cheese, egg dishes, soups, vegetables, sauces Cilantro-Salsa, casseroles Cinnamon-Baked goods, fruit, pork, lamb, chicken, carrots Cloves-Fruit, baked goods, fish, pot roast, green beans, beets, carrots Coriander-Pastry, cookies, meat, salads, cheese (lemon-orange flavor) Cumin-Meatloaf, fish,cheese, eggs, cabbage,fruit pie (caraway flavor) [employer], fruit, eggs, fish, poultry, cottage cheese, vegetables Dill Seed-Meat, cottage cheese, poultry, vegetables, fish, salads, bread Fennel Seed-Bread, cookies, apples, pork, eggs, fish, beets, cabbage, cheese, Licorice-like flavor Garlic-(buds or powder) Salads, meat, poultry, fish, bread, butter, vegetables, potatoes.Do not  use garlic salt Ginger-Fruit, vegetables, baked goods, meat, fish, poultry Horseradish Root-Meet, vegetables, butter Lemon Juice or Extract-Vegetables, fruit, tea, baked goods, fish salads Mace-Baked goods fruit, vegetables, fish, poultry (taste like nutmeg) Maple Extract-Syrups Marjoram-Meat, chicken, fish, vegetables, breads, green salads (taste like Sage) Mint-Tea, lamb, sherbet, vegetables, desserts, carrots, cabbage Mustard, Dry or Seed-Cheese, eggs, meats, vegetables,  poultry Nutmeg-Baked goods, fruit, chicken, eggs, vegetables, desserts Onion Powder-Meat, fish, poultry, vegetables, cheese, eggs, bread, rice salads (Do not use   Onion salt) Orange Extract-Desserts, baked goods Oregano-Pasta, eggs, cheese, onions, pork, lamb, fish, chicken, vegetables, green salads Paprika-Meat, fish, poultry, eggs, cheese, vegetables Parsley Flakes-Butter, vegetables, meat fish, poultry, eggs, bread, salads (certain forms may   Contain sodium Pepper-Meat fish, poultry, vegetables, eggs Peppermint Extract-Desserts, baked goods Poppy Seed-Eggs, bread, cheese, fruit dressings, baked goods, noodles, vegetables, cottage  [employer], poultry, meat, fish, cauliflower, turnips,eggs bread Saffron-Rice, bread, veal, chicken, fish, eggs Sage-Meat, fish, poultry, onions, eggplant, tomateos, pork, stews Savory-Eggs, salads, poultry, meat, rice, vegetables, soups, pork Tarragon-Meat, poultry, fish, eggs, butter, vegetables (licorice-like flavor)  Thyme-Meat, poultry, fish, eggs, vegetables, (clover-like flavor), sauces, soups Tumeric-Salads, butter, eggs, fish, rice, vegetables (saffron-like flavor) Vanilla Extract-Baked goods, candy Vinegar-Salads, vegetables, meat marinades Walnut Extract-baked goods, candy  2. Choose your Foods Wisely   The following is a list of foods to avoid which are high in sodium:  Meats-Avoid all smoked, canned, salt cured, dried and kosher meat and fish as well as Anchovies   [employer] meats:Bologna, Liverwurst, Pastrami Canned meat or fish  Marinated herring Caviar    Pepperoni Corned Beef   Pizza Dried chipped beef  Salami Frozen breaded fish or meat Salt pork Frankfurters or hot dogs  Sardines Gefilte fish   Sausage Ham (boiled ham, Proscuitto Smoked butt    spiced ham)   Spam      TV Dinners Vegetables Canned vegetables (Regular) Relish Canned mushrooms  Sauerkraut Olives    Tomato  juice Pickles  Bakery and Dessert Products Canned puddings  Cream pies Cheesecake   Decorated cakes Cookies  Beverages/Juices Tomato juice, regular  Gatorade   V-8 vegetable juice, regular  Breads and Cereals Biscuit mixes   Salted potato chips, corn chips, pretzels Bread stuffing mixes  Salted crackers and rolls Pancake and waffle mixes Self-rising flour  Seasonings Accent    Meat sauces Barbecue sauce  Meat tenderizer Catsup    Monosodium glutamate (MSG) Celery salt   Onion salt Chili sauce   Prepared mustard Garlic salt   Salt, seasoned salt, sea salt Gravy mixes   Soy sauce Horseradish   Steak sauce Ketchup   Tartar sauce Lite salt    Teriyaki sauce Marinade mixes   Worcestershire sauce  Others Baking powder   Cocoa and cocoa mixes Baking soda   Commercial casserole mixes Candy-caramels, chocolate  Dehydrated soups    Bars, fudge,nougats  Instant rice and pasta mixes Canned broth or soup  Maraschino cherries Cheese, aged and processed cheese and cheese spreads  Learning Assessment Quiz  Indicated T (for True) or F (for False) for each of the following statements:  1. _____ Fresh fruits and vegetables and unprocessed grains are generally low in sodium 2. _____ Water may contain a considerable amount of sodium, depending on the source 3. _____ You can always tell if a food is high in sodium by tasting it 4. _____ Certain laxatives my be high in sodium and should be avoided unless prescribed   by a physician or pharmacist 5. _____ Salt substitutes may be used freely by anyone on a sodium restricted diet 6. _____ Sodium is present in table salt, food additives and as a natural component of   most foods 7. _____ Table salt is approximately 90% sodium 8. _____ Limiting sodium intake may help prevent excess fluid accumulation in the body 9. _____ On a sodium-restricted diet, seasonings such as bouillon soy sauce, and    cooking wine should be used in place of table  salt 10. _____ On an ingredient list, a product which lists monosodium glutamate as the first   ingredient is an appropriate food to include on a low sodium diet  Circle the best answer(s) to the following statements (Hint: there may be more than one correct answer)  11. On a low-sodium diet, some acceptable snack items are:    A. Olives  F. Bean dip   K. Grapefruit juice    B. Salted Pretzels G. Commercial Popcorn   L. Canned peaches    C. Carrot Sticks  H. Bouillon   M. Unsalted nuts   D. [place] fries  I. Peanut butter crackers N. Salami   E. Sweet pickles J. Tomato Juice   O. Pizza  12.  Seasonings that may be used freely on a reduced - sodium diet include   A. Lemon wedges F.Monosodium glutamate K. Celery seed    B.Soysauce   G. Pepper   L. Mustard powder   C. Sea salt  H. Cooking wine  M. Onion flakes   D. Vinegar  E. Prepared horseradish N. Salsa   E. Sage   J. Worcestershire sauce  O. Chutney

## 2019-11-02 ENCOUNTER — Ambulatory Visit: Payer: Medicare PPO | Admitting: Podiatry

## 2019-11-02 ENCOUNTER — Other Ambulatory Visit: Payer: Self-pay

## 2019-11-02 ENCOUNTER — Other Ambulatory Visit: Payer: Self-pay | Admitting: Podiatry

## 2019-11-02 ENCOUNTER — Ambulatory Visit (INDEPENDENT_AMBULATORY_CARE_PROVIDER_SITE_OTHER): Payer: Medicare PPO

## 2019-11-02 ENCOUNTER — Telehealth (HOSPITAL_COMMUNITY): Payer: Self-pay | Admitting: *Deleted

## 2019-11-02 ENCOUNTER — Ambulatory Visit: Payer: Medicare PPO

## 2019-11-02 DIAGNOSIS — M722 Plantar fascial fibromatosis: Secondary | ICD-10-CM

## 2019-11-02 DIAGNOSIS — M79672 Pain in left foot: Secondary | ICD-10-CM

## 2019-11-02 DIAGNOSIS — M7730 Calcaneal spur, unspecified foot: Secondary | ICD-10-CM

## 2019-11-02 DIAGNOSIS — M79671 Pain in right foot: Secondary | ICD-10-CM

## 2019-11-02 NOTE — Telephone Encounter (Signed)
Left message on voicemail per DPR in reference to upcoming appointment scheduled on 11/06/19 at 8:15 with detailed instructions given per Myocardial Perfusion Study Information Sheet for the test. LM to arrive 15 minutes early, and that it is imperative to arrive on time for appointment to keep from having the test rescheduled. If you need to cancel or reschedule your appointment, please call the office within 24 hours of your appointment. Failure to do so may result in a cancellation of your appointment, and a $50 no show fee. Phone number given for call back for any questions.

## 2019-11-03 ENCOUNTER — Encounter: Payer: Self-pay | Admitting: Podiatry

## 2019-11-03 NOTE — Progress Notes (Signed)
Subjective:  Patient ID: Pedro Lee, male    DOB: February 09, 1941,  MRN: [id number]  Chief Complaint  Patient presents with  . Foot Pain    bilateral foot pain   . Ankle Pain    ankle swelling  . Callouses    history of callouses     79 y.o. male presents with the above complaint. Patient presents with complaint of bilateral heel pain that has been going on for quite some time. Patient states it hurts to walk on. Has been on for 5 to 6 months. Patient has not seen at anyone else in the past for this. He would like to discuss treatment options. 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 He denies any other acute complaints.   Review of Systems: Negative except as noted in the HPI. Denies N/V/F/Ch.  Past Medical History:  Diagnosis Date  . Allergy    sesonal  . Complication of anesthesia    HX OF ENLARGED PROSTATE AND UNABLE TO PASS WATER AFTER ANESTHESIA  . Dyspnea    with some exertion  . Dysrhythmia    HX ATRIAL FIBRILLATION - HAD ABLATION - NO LONGER HAS AF.  Marland Kitchen Essential hypertension, malignant   . Gout, unspecified   . Obesity, mild   . OSA (obstructive sleep apnea)    Mild - PT STATES HE DID NOT HAVE TO USE CPAP  . Osteoarthritis   . Osteoarthrosis, unspecified whether generalized or localized, unspecified site   . Other dyspnea and respiratory abnormality   . Prostate enlargement    DR. NESI IS PT'S UROLOGIST  . Pure hypercholesterolemia   . Renal insufficiency, mild    PT STATES NOT AWARE OF ANY KIDNEY PROBLEM    Current Outpatient Medications:  .  allopurinol (ZYLOPRIM) 300 MG tablet, Take 300 mg by mouth daily.  , Disp: , Rfl:  .  aspirin EC 81 MG tablet, Take 81 mg by mouth daily., Disp: , Rfl:  .  benazepril-hydrochlorthiazide (LOTENSIN HCT) 20-25 MG per tablet, Take 1 tablet by mouth every morning., Disp: , Rfl:  .  colchicine 0.6 MG tablet, Take 0.6 mg by mouth as needed (gout). , Disp: , Rfl:  .  doxazosin (CARDURA) 1 MG tablet, Take 2 tablets  (2 mg total) by mouth daily., Disp: 180 tablet, Rfl: 1 .  metoprolol (TOPROL-XL) 100 MG 24 hr tablet, Take 100 mg by mouth daily. , Disp: , Rfl:  .  montelukast (SINGULAIR) 10 MG tablet, Take 10 mg by mouth daily., Disp: , Rfl: tm .  OVER THE COUNTER MEDICATION, Place 1 drop into both eyes as needed (for dry eyes). Crocodile Tears, Disp: , Rfl:  .  rosuvastatin (CRESTOR) 10 MG tablet, TAKE 1 TABLET BY MOUTH EVERYDAY AT BEDTIME, Disp: , Rfl: 3 .  tamsulosin (FLOMAX) 0.4 MG CAPS capsule, Take 0.4 mg by mouth daily., Disp: , Rfl:   Social History   Tobacco Use  Smoking Status Never Smoker  Smokeless Tobacco Never Used    Allergies  Allergen Reactions  . Penicillins Rash   Objective:  There were no vitals filed for this visit. There is no height or weight on file to calculate BMI. Constitutional Well developed. Well nourished.  Vascular Dorsalis pedis pulses palpable bilaterally. Posterior tibial pulses palpable bilaterally. Capillary refill normal to all digits.  No cyanosis or clubbing noted. Pedal hair growth normal.  Neurologic Normal speech. Oriented to person, place, and time. Epicritic sensation to light touch grossly present bilaterally.  Dermatologic Nails  well groomed and normal in appearance. No open wounds. No skin lesions.  Orthopedic: Normal joint ROM without pain or crepitus bilaterally. No visible deformities. Tender to palpation at the calcaneal tuber bilaterally. No pain with calcaneal squeeze bilaterally. Ankle ROM diminished range of motion bilaterally. Silfverskiold Test: positive bilaterally.   Radiographs: Taken and reviewed. No acute fractures or dislocations. No evidence of stress fracture.  Plantar heel spur present. Posterior heel spur present.   Assessment:   1. Plantar fasciitis of left foot   2. Plantar fasciitis of right foot   3. Heel spur, unspecified laterality    Plan:  Patient was evaluated and treated and all questions  answered.  Plantar Fasciitis, bilaterally - XR reviewed as above.  - Educated on icing and stretching. Instructions given.  - Injection delivered to the plantar fascia as below. - DME: I will dispense plantar fascial brace during next visit as there is no supply available - Pharmacologic management: None  Procedure: Injection Tendon/Ligament Location: Bilateral plantar fascia at the glabrous junction; medial approach. Skin Prep: alcohol Injectate: 0.5 cc 0.5% marcaine plain, 0.5 cc of 1% Lidocaine, 0.5 cc kenalog 10. Disposition: Patient tolerated procedure well. Injection site dressed with a band-aid.  No follow-ups on file.

## 2019-11-06 ENCOUNTER — Other Ambulatory Visit: Payer: Self-pay

## 2019-11-06 ENCOUNTER — Ambulatory Visit (HOSPITAL_COMMUNITY): Payer: Medicare PPO | Attending: Cardiovascular Disease

## 2019-11-06 DIAGNOSIS — R0609 Other forms of dyspnea: Secondary | ICD-10-CM

## 2019-11-06 DIAGNOSIS — R0600 Dyspnea, unspecified: Secondary | ICD-10-CM | POA: Diagnosis not present

## 2019-11-06 MED ORDER — TECHNETIUM TC 99M TETROFOSMIN IV KIT
32.4000 | PACK | Freq: Once | INTRAVENOUS | Status: AC | PRN
  Administered 2019-11-06: 32.4 via INTRAVENOUS
  Filled 2019-11-06: qty 33

## 2019-11-06 MED ORDER — REGADENOSON 0.4 MG/5ML IV SOLN
0.4000 mg | Freq: Once | INTRAVENOUS | Status: AC
  Administered 2019-11-06: 0.4 mg via INTRAVENOUS

## 2019-11-07 ENCOUNTER — Ambulatory Visit (HOSPITAL_COMMUNITY): Payer: Medicare PPO | Attending: Cardiology

## 2019-11-07 ENCOUNTER — Ambulatory Visit (HOSPITAL_COMMUNITY): Payer: Medicare PPO | Attending: Cardiovascular Disease

## 2019-11-07 DIAGNOSIS — R0600 Dyspnea, unspecified: Secondary | ICD-10-CM | POA: Diagnosis present

## 2019-11-07 DIAGNOSIS — R0609 Other forms of dyspnea: Secondary | ICD-10-CM

## 2019-11-07 LAB — ECHOCARDIOGRAM COMPLETE
Area-P 1/2: 3.48 cm2
S' Lateral: 2.7 cm

## 2019-11-07 LAB — MYOCARDIAL PERFUSION IMAGING
LV dias vol: 130 mL (ref 62–150)
LV sys vol: 52 mL
Peak HR: 64 {beats}/min
Rest HR: 52 {beats}/min
SDS: 0
SRS: 0
SSS: 0
TID: 1.03

## 2019-11-07 MED ORDER — TECHNETIUM TC 99M TETROFOSMIN IV KIT
31.5000 | PACK | Freq: Once | INTRAVENOUS | Status: AC | PRN
  Administered 2019-11-07: 31.5 via INTRAVENOUS
  Filled 2019-11-07: qty 32

## 2019-11-13 NOTE — Progress Notes (Signed)
Cardiology Office Note    Date:  11/15/2019   ID:  Pedro Lee, DOB [birth year], MRN [id number]  PCP:  Rogers Blocker, MD  Cardiologist: Sinclair Grooms, MD EPS: None  Chief Complaint  Patient presents with  . Follow-up    History of Present Illness:  Pedro Lee is a 79 y.o. 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   Patient saw Dr. Tamala Julian 12/13/2017 for dyspnea on exertion that was chronic and felt secondary to deconditioning hypertension and obesity.  Arm discomfort was clearly positional, blood pressure controlled.  No changes made.   I saw the patient 11/02/2019 and BP running high the past few days 149/107 was the highest. PCP cut back on doxazosin from 4 mg to 1 mg daily a few months ago because of low BP and DOE-no dizziness. Patient had worsening dyspnea on exertion. Quit going to the gym because of shortness of breath. . Father had an MI 95 but was a smoker. Siblings without heart disease. Had some swelling in legs yesterday but had bacon and on his feet a lot. Usually watches his salt closely.  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  Patient comes in for f/u. BP running high 170/90 in am before meds and 150/70-80's in afternoon. Watching his salt.Walking 1/2-1 mile daily. Going to get a trainer. No chest pain. Breathing and swelling better.         Past Medical History:  Diagnosis Date  . Allergy    sesonal  . Complication of anesthesia    HX OF ENLARGED PROSTATE AND UNABLE TO PASS WATER AFTER ANESTHESIA  . Dyspnea    with some exertion  . Dysrhythmia    HX ATRIAL FIBRILLATION - HAD ABLATION - NO LONGER HAS AF.  Marland Kitchen Essential hypertension, malignant   . Gout, unspecified   . Obesity, mild   . OSA (obstructive sleep apnea)    Mild - PT STATES HE DID NOT HAVE TO USE CPAP  .  Osteoarthritis   . Osteoarthrosis, unspecified whether generalized or localized, unspecified site   . Other dyspnea and respiratory abnormality   . Prostate enlargement    DR. NESI IS PT'S UROLOGIST  . Pure hypercholesterolemia   . Renal insufficiency, mild    PT STATES NOT AWARE OF ANY KIDNEY PROBLEM    Past Surgical History:  Procedure Laterality Date  . COLONOSCOPY    . CRANIOTOMY N/A 03/24/2016   Procedure: Transphenoidal Resection of Tumor;  Surgeon: Earnie Larsson, MD;  Location: [facility];  Service: Neurosurgery;  Laterality: N/A;  Transphenoidal Resection of Tumor  . HEART ABLATION FOR AF  1998  . JOINT REPLACEMENT     LEFT TOTAL KNEE REPLACEMENT  . KNEE ARTHROPLASTY Left 2013ish  . LEFT KNEE ARTHROSCOPY    . TOTAL KNEE ARTHROPLASTY Right 05/30/2013   Procedure: RIGHT TOTAL KNEE ARTHROPLASTY;  Surgeon: Mauri Pole, MD;  Location: [facility];  Service: Orthopedics;  Laterality: Right;  . TRANSNASAL APPROACH N/A 03/24/2016   Procedure: TRANSNASAL APPROACH;  Surgeon: Rozetta Nunnery, MD;  Location: [facility];  Service: ENT;  Laterality: N/A;  TRANSNASAL APPROACH    Current Medications: Current Meds  Medication Sig  . allopurinol (ZYLOPRIM) 300 MG tablet Take 300 mg by mouth daily.    Marland Kitchen aspirin EC 81 MG  tablet Take 81 mg by mouth daily.  . benazepril-hydrochlorthiazide (LOTENSIN HCT) 20-25 MG per tablet Take 1 tablet by mouth every morning.  . colchicine 0.6 MG tablet Take 0.6 mg by mouth as needed (gout).   Marland Kitchen doxazosin (CARDURA) 2 MG tablet Take 1 tablet (2 mg total) by mouth 2 (two) times daily.  . metoprolol (TOPROL-XL) 100 MG 24 hr tablet Take 100 mg by mouth daily.   . montelukast (SINGULAIR) 10 MG tablet Take 10 mg by mouth daily.  Marland Kitchen OVER THE COUNTER MEDICATION Place 1 drop into both eyes as needed (for dry eyes). Crocodile Tears  . rosuvastatin (CRESTOR) 10 MG tablet TAKE 1 TABLET BY MOUTH EVERYDAY AT BEDTIME  . tamsulosin (FLOMAX) 0.4 MG CAPS capsule Take 0.4 mg by mouth daily.   . [DISCONTINUED] doxazosin (CARDURA) 1 MG tablet Take 2 tablets (2 mg total) by mouth daily.     Allergies:   Penicillins   Social History   Socioeconomic History  . Marital status: Married    Spouse name: Not on file  . Number of children: Not on file  . Years of education: Not on file  . Highest education level: Not on file  Occupational History  . Not on file  Tobacco Use  . Smoking status: Never Smoker  . Smokeless tobacco: Never Used  Substance and Sexual Activity  . Alcohol use: No    Alcohol/week: 0.0 standard drinks  . Drug use: No  . Sexual activity: Not on file  Other Topics Concern  . Not on file  Social History Narrative  . Not on file   Social Determinants of Health   Financial Resource Strain:   . Difficulty of Paying Living Expenses: Not on file  Food Insecurity:   . Worried About Charity fundraiser in the Last Year: Not on file  . Ran Out of Food in the Last Year: Not on file  Transportation Needs:   . Lack of Transportation (Medical): Not on file  . Lack of Transportation (Non-Medical): Not on file  Physical Activity:   . Days of Exercise per Week: Not on file  . Minutes of Exercise per Session: Not on file  Stress:   . Feeling of Stress : Not on file  Social Connections:   . Frequency of Communication with Friends and Family: Not on file  . Frequency of Social Gatherings with Friends and Family: Not on file  . Attends Religious Services: Not on file  . Active Member of Clubs or Organizations: Not on file  . Attends Archivist Meetings: Not on file  . Marital Status: Not on file     Family History:  The patient's family history includes Heart disease (age of onset: 64) in his father.   ROS:   Please see the history of present illness.    ROS All other systems reviewed and are negative.   PHYSICAL EXAM:   VS:  BP (!) 152/78   Pulse 75   Ht 6\' 4"  (1.93 m)   Wt 298 lb 12.8 oz (135.5 kg)   SpO2 94%   BMI 36.37 kg/m    Physical Exam  GEN: Obese, in no acute distress  Neck: no JVD, carotid bruits, or masses Cardiac:RRR;S4 no murmurs, rubs,  Respiratory:  clear to auscultation bilaterally, normal work of breathing GI: soft, nontender, nondistended, + BS Ext: without cyanosis, clubbing, or edema, Good distal pulses bilaterally Neuro:  Alert and Oriented x 3 Psych: euthymic mood, full affect  Wt Readings from Last 3 Encounters:  11/15/19 298 lb 12.8 oz (135.5 kg)  11/06/19 (!) 304 lb (137.9 kg)  10/17/19 (!) 304 lb (137.9 kg)      Studies/Labs Reviewed:   EKG:  EKG is not ordered today.   Recent Labs: No results found for requested labs within last 8760 hours.   Lipid Panel No results found for: CHOL, TRIG, HDL, CHOLHDL, VLDL, LDLCALC, LDLDIRECT  Additional studies/ records that were reviewed today include:  2D echo 11/07/2019 IMPRESSIONS     1. Left ventricular ejection fraction, by estimation, is 60 to 65%. The  left ventricle has normal function. The left ventricle has no regional  wall motion abnormalities. The left ventricular internal cavity size was  mildly dilated. There is mild left  ventricular hypertrophy. Left ventricular diastolic parameters are  consistent with Grade I diastolic dysfunction (impaired relaxation).   2. Right ventricular systolic function is normal. The right ventricular  size is mildly enlarged.   3. Right atrial size was mildly dilated.   4. The mitral valve is normal in structure. Mild mitral valve  regurgitation. No evidence of mitral stenosis.   5. The aortic valve is tricuspid. Aortic valve regurgitation is not  visualized. Mild aortic valve sclerosis is present, with no evidence of  aortic valve stenosis.   6. Aortic dilatation noted. There is mild dilatation of the aortic root,  measuring 39 mm.   FINDINGS   Left Ventricle: Left ventricular ejection fraction, by estimation, is 60  to 65%. The left ventricle has normal function. The left ventricle  has no  regional wall motion abnormalities. The left ventricular internal cavity  size was mildly dilated. There is   mild left ventricular hypertrophy. Left ventricular diastolic parameters  are consistent with Grade I diastolic dysfunction (impaired relaxation).   Right Ventricle: The right ventricular size is mildly enlarged. Right  ventricular systolic function is normal.   Left Atrium: Left atrial size was normal in size.   Right Atrium: Right atrial size was mildly dilated.   Pericardium: There is no evidence of pericardial effusion.   Mitral Valve: The mitral valve is normal in structure. Mild mitral annular  calcification. Mild mitral valve regurgitation. No evidence of mitral  valve stenosis.   Tricuspid Valve: The tricuspid valve is normal in structure. Tricuspid  valve regurgitation is mild . No evidence of tricuspid stenosis.   Aortic Valve: The aortic valve is tricuspid. Aortic valve regurgitation is  not visualized. Mild aortic valve sclerosis is present, with no evidence  of aortic valve stenosis.   Pulmonic Valve: The pulmonic valve was normal in structure. Pulmonic valve  regurgitation is trivial. No evidence of pulmonic stenosis.   Aorta: The aortic root is normal in size and structure and aortic  dilatation noted. There is mild dilatation of the aortic root, measuring  39 mm.   Venous: The inferior vena cava was not well visualized.   IAS/Shunts: No atrial level shunt detected by color flow Doppler.     NST 11/07/2019 Study Highlights   Nuclear stress EF: 60%.  The left ventricular ejection fraction is normal (55-65%).  There was no ST segment deviation noted during stress.  The study is normal.       ASSESSMENT:    1. Dyspnea on exertion   2. H/O atrioventricular nodal ablation   3. Essential hypertension   4. Morbid obesity ([place])   5. 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 No regular exercise and BP has been running high. With family history of CAD I ordered Lexiscan Myoview 11/07/2019 which was normal.  The echo with normal LVEF 60 to 65%, grade 1 DD and mildly dilated aortic root. Reviewed with patient. Symptoms could be due to deconditioning and obesity. Now exercising and lost 5 lbs.     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 Running high at home and I increased it to 2 mg daily. Still running high. He's watching his salt. 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  Concerns regarding medicines are outlined above.  Medication changes, Labs and Tests ordered today are listed in the Patient Instructions below. Patient Instructions  Medication Instructions:  Your physician has recommended you make the following change in your medication:   INCREASE Cardura to 2mg  twice daily  *If you need a refill on your cardiac medications before your next appointment, please call your pharmacy*   Lab Work: None If you have labs (blood work) drawn today and your tests are completely normal, you will receive your results only by: Marland Kitchen MyChart Message (if you have MyChart) OR . A paper copy in the mail If you have any lab test that is abnormal or we need to change your treatment, we will call you to review the results.   Testing/Procedures: None   Follow-Up: At [facility], you and your health needs are our priority.  As part of our continuing mission to provide you with exceptional heart care, we have created designated Provider Care Teams.  These Care Teams include your  primary Cardiologist (physician) and Advanced Practice Providers (APPs -  Physician Assistants and Nurse Practitioners) who all work together to provide you with the care you need, when you need it.  We recommend signing up for the patient portal called "MyChart".  Sign up information is provided on this After Visit Summary.  MyChart is used to connect with patients for Virtual Visits (Telemedicine).  Patients are able to view lab/test results, encounter notes, upcoming appointments, etc.  Non-urgent messages can be sent to your provider as well.   To learn more about what you can do with MyChart, go to [email or web address].    Your next appointment:   12/06/2019  The format for your next appointment:   Virtual Visit   Provider:   Ermalinda Barrios, PA-C      Signed, Ermalinda Barrios, PA-C  11/15/2019 7:55 AM    [address], [place], [place]  [address] Phone: [phone]; Fax: [phone]

## 2019-11-15 ENCOUNTER — Encounter: Payer: Self-pay | Admitting: Physician Assistant

## 2019-11-15 ENCOUNTER — Ambulatory Visit (INDEPENDENT_AMBULATORY_CARE_PROVIDER_SITE_OTHER): Payer: Medicare PPO | Admitting: Physician Assistant

## 2019-11-15 ENCOUNTER — Other Ambulatory Visit: Payer: Self-pay

## 2019-11-15 VITALS — BP 152/78 | HR 75 | Ht 76.0 in | Wt 298.8 lb

## 2019-11-15 DIAGNOSIS — I10 Essential (primary) hypertension: Secondary | ICD-10-CM | POA: Diagnosis not present

## 2019-11-15 DIAGNOSIS — R0609 Other forms of dyspnea: Secondary | ICD-10-CM

## 2019-11-15 DIAGNOSIS — R0600 Dyspnea, unspecified: Secondary | ICD-10-CM

## 2019-11-15 DIAGNOSIS — Z9889 Other specified postprocedural states: Secondary | ICD-10-CM | POA: Diagnosis not present

## 2019-11-15 DIAGNOSIS — I77810 Thoracic aortic ectasia: Secondary | ICD-10-CM

## 2019-11-15 MED ORDER — DOXAZOSIN MESYLATE 2 MG PO TABS: 2.0000 mg | ORAL_TABLET | Freq: Two times a day (BID) | ORAL | 1 refills | Status: DC

## 2019-11-15 NOTE — Patient Instructions (Signed)
Medication Instructions:  Your physician has recommended you make the following change in your medication:   INCREASE Cardura to 2mg  twice daily  *If you need a refill on your cardiac medications before your next appointment, please call your pharmacy*   Lab Work: None If you have labs (blood work) drawn today and your tests are completely normal, you will receive your results only by: Marland Kitchen MyChart Message (if you have MyChart) OR . A paper copy in the mail If you have any lab test that is abnormal or we need to change your treatment, we will call you to review the results.   Testing/Procedures: None   Follow-Up: At [facility], you and your health needs are our priority.  As part of our continuing mission to provide you with exceptional heart care, we have created designated Provider Care Teams.  These Care Teams include your primary Cardiologist (physician) and Advanced Practice Providers (APPs -  Physician Assistants and Nurse Practitioners) who all work together to provide you with the care you need, when you need it.  We recommend signing up for the patient portal called "MyChart".  Sign up information is provided on this After Visit Summary.  MyChart is used to connect with patients for Virtual Visits (Telemedicine).  Patients are able to view lab/test results, encounter notes, upcoming appointments, etc.  Non-urgent messages can be sent to your provider as well.   To learn more about what you can do with MyChart, go to [email or web address].    Your next appointment:   12/06/2019  The format for your next appointment:   Virtual Visit   Provider:   Ermalinda Barrios, PA-C

## 2019-11-29 ENCOUNTER — Ambulatory Visit: Payer: Medicare PPO | Admitting: Podiatry

## 2019-12-04 NOTE — Progress Notes (Signed)
Virtual Visit via Telephone Note   This visit type was conducted due to national recommendations for restrictions regarding the COVID-19 Pandemic (e.g. social distancing) in an effort to limit this patient's exposure and mitigate transmission in our community.  Due to his co-morbid illnesses, this patient is at least at moderate risk for complications without adequate follow up.  This format is felt to be most appropriate for this patient at this time.  The patient did not have access to video technology/had technical difficulties with video requiring transitioning to audio format only (telephone).  All issues noted in this document were discussed and addressed.  No physical exam could be performed with this format.  Please refer to the patient's chart for his  consent to telehealth for [facility]    Date:  12/06/2019   ID:  Pedro Lee, DOB 29-Jan-1941, MRN [id number] The patient was identified using 2 identifiers.  Patient Location: Home Provider Location: Office/Clinic  PCP:  Pedro Blocker, MD  Cardiologist:  Pedro Grooms, MD   Electrophysiologist:  None   Evaluation Performed:  Follow-Up Visit  Chief Complaint:    History of Present Illness:    Pedro Lee is a 79 y.o. 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   Patient saw Dr. Tamala Lee 12/13/2017 for dyspnea on exertion that was chronic and felt secondary to deconditioning hypertension and obesity.  Arm discomfort was clearly positional, blood pressure controlled.  No changes made.   I saw the patient 11/02/2019 and BP running high the past few days 149/107 was the highest. PCP cut back on doxazosin from 4 mg to 1 mg daily a few months ago because of low BP and DOE-no dizziness. Patient had worsening dyspnea on exertion. Quit going to the gym because of shortness of breath. . Father had an MI 91 but was a smoker. Siblings without heart  disease. Had some swelling in legs yesterday but had bacon and on his feet a lot. Usually watches his salt closely.  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  I last saw the patient 11/15/2019 and blood pressure was still running high.  I increase his doxazosin to 2 mg twice daily.  Virtual visit with the patient today.  Blood pressures have been up and down.  The reading he gave [place] today was from last evening.  Sat 178/105, 142/90 HR 64 197/117 Sun am 158/90 Mon am 148/83, 123/78.  He watches his salt.  He is working out with a Clinical research associate and has lost 9 pounds in the past month.  Discussed adding another medication such as amlodipine but he would rather go up on his doxazosin since he is also having some prostate issues.  Also discussed referral to Dr. Oval Lee in the hypertension clinic which he is agreeable to.    The patient does not have symptoms concerning for COVID-19 infection (fever, chills, cough, or new shortness of breath).    Past Medical History:  Diagnosis Date   Allergy    sesonal   Complication of anesthesia    HX OF ENLARGED PROSTATE AND UNABLE TO PASS WATER AFTER ANESTHESIA   Dyspnea    with some exertion   Dysrhythmia    HX ATRIAL FIBRILLATION - HAD ABLATION - NO LONGER HAS AF.   Essential hypertension, malignant  Gout, unspecified    Obesity, mild    OSA (obstructive sleep apnea)    Mild - PT STATES HE DID NOT HAVE TO USE CPAP   Osteoarthritis    Osteoarthrosis, unspecified whether generalized or localized, unspecified site    Other dyspnea and respiratory abnormality    Prostate enlargement    Pedro Lee IS PT'S UROLOGIST   Pure hypercholesterolemia    Renal insufficiency, mild    PT STATES NOT AWARE OF ANY KIDNEY PROBLEM   Past Surgical History:  Procedure Laterality Date   COLONOSCOPY     CRANIOTOMY N/A 03/24/2016   Procedure:  Transphenoidal Resection of Tumor;  Surgeon: Pedro Larsson, MD;  Location: [place];  Service: Neurosurgery;  Laterality: N/A;  Transphenoidal Resection of Tumor   HEART ABLATION FOR AF  1998   JOINT REPLACEMENT     LEFT TOTAL KNEE REPLACEMENT   KNEE ARTHROPLASTY Left 2013ish   LEFT KNEE ARTHROSCOPY     TOTAL KNEE ARTHROPLASTY Right 05/30/2013   Procedure: RIGHT TOTAL KNEE ARTHROPLASTY;  Surgeon: Mauri Pole, MD;  Location: [facility];  Service: Orthopedics;  Laterality: Right;   TRANSNASAL APPROACH N/A 03/24/2016   Procedure: TRANSNASAL APPROACH;  Surgeon: Rozetta Nunnery, MD;  Location[facility];  Service: ENT;  Laterality: N/A;  TRANSNASAL APPROACH     Current Meds  Medication Sig   allopurinol (ZYLOPRIM) 300 MG tablet Take 300 mg by mouth daily.     aspirin EC 81 MG tablet Take 81 mg by mouth daily.   benazepril-hydrochlorthiazide (LOTENSIN HCT) 20-25 MG per tablet Take 1 tablet by mouth every morning.   colchicine 0.6 MG tablet Take 0.6 mg by mouth as needed (gout).    doxazosin (CARDURA) 2 MG tablet Take 1 tablet (2 mg total) by mouth 2 (two) times daily.   metoprolol (TOPROL-XL) 100 MG 24 hr tablet Take 100 mg by mouth daily.    montelukast (SINGULAIR) 10 MG tablet Take 10 mg by mouth daily.   OVER THE COUNTER MEDICATION Place 1 drop into both eyes as needed (for dry eyes). Crocodile Tears   rosuvastatin (CRESTOR) 10 MG tablet TAKE 1 TABLET BY MOUTH EVERYDAY AT BEDTIME   tamsulosin (FLOMAX) 0.4 MG CAPS capsule Take 0.4 mg by mouth daily.     Allergies:   Penicillins   Social History   Tobacco Use   Smoking status: Never Smoker   Smokeless tobacco: Never Used  Vaping Use   Vaping Use: Never used  Substance Use Topics   Alcohol use: No    Alcohol/week: 0.0 standard drinks   Drug use: No     Family Hx: The patient's family history includes Heart disease (age of onset: 57) in his father.  ROS:   Please see the history of present illness.     All other  systems reviewed and are negative.   Prior CV studies:   The following studies were reviewed today:  2D echo 11/07/2019 IMPRESSIONS     1. Left ventricular ejection fraction, by estimation, is 60 to 65%. The  left ventricle has normal function. The left ventricle has no regional  wall motion abnormalities. The left ventricular internal cavity size was  mildly dilated. There is mild left  ventricular hypertrophy. Left ventricular diastolic parameters are  consistent with Grade I diastolic dysfunction (impaired relaxation).   2. Right ventricular systolic function is normal. The right ventricular  size is mildly enlarged.   3. Right atrial size was mildly dilated.   4. The  mitral valve is normal in structure. Mild mitral valve  regurgitation. No evidence of mitral stenosis.   5. The aortic valve is tricuspid. Aortic valve regurgitation is not  visualized. Mild aortic valve sclerosis is present, with no evidence of  aortic valve stenosis.   6. Aortic dilatation noted. There is mild dilatation of the aortic root,  measuring 39 mm.   FINDINGS   Left Ventricle: Left ventricular ejection fraction, by estimation, is 60  to 65%. The left ventricle has normal function. The left ventricle has no  regional wall motion abnormalities. The left ventricular internal cavity  size was mildly dilated. There is   mild left ventricular hypertrophy. Left ventricular diastolic parameters  are consistent with Grade I diastolic dysfunction (impaired relaxation).   Right Ventricle: The right ventricular size is mildly enlarged. Right  ventricular systolic function is normal.   Left Atrium: Left atrial size was normal in size.   Right Atrium: Right atrial size was mildly dilated.   Pericardium: There is no evidence of pericardial effusion.   Mitral Valve: The mitral valve is normal in structure. Mild mitral annular  calcification. Mild mitral valve regurgitation. No evidence of mitral  valve  stenosis.   Tricuspid Valve: The tricuspid valve is normal in structure. Tricuspid  valve regurgitation is mild . No evidence of tricuspid stenosis.   Aortic Valve: The aortic valve is tricuspid. Aortic valve regurgitation is  not visualized. Mild aortic valve sclerosis is present, with no evidence  of aortic valve stenosis.   Pulmonic Valve: The pulmonic valve was normal in structure. Pulmonic valve  regurgitation is trivial. No evidence of pulmonic stenosis.   Aorta: The aortic root is normal in size and structure and aortic  dilatation noted. There is mild dilatation of the aortic root, measuring  39 mm.   Venous: The inferior vena cava was not well visualized.   IAS/Shunts: No atrial level shunt detected by color flow Doppler.     NST 11/07/2019 Study Highlights    Nuclear stress EF: 60%.  The left ventricular ejection fraction is normal (55-65%).  There was no ST segment deviation noted during stress.  The study is normal.         Labs/Other Tests and Data Reviewed:    EKG:  No ECG reviewed.  Recent Labs: No results found for requested labs within last 8760 hours.   Recent Lipid Panel No results found for: CHOL, TRIG, HDL, CHOLHDL, LDLCALC, LDLDIRECT  Wt Readings from Last 3 Encounters:  12/06/19 295 lb (133.8 kg)  11/15/19 298 lb 12.8 oz (135.5 kg)  11/06/19 (!) 304 lb (137.9 kg)     Risk Assessment/Calculations:      Objective:    Vital Signs:  BP 123/78    Pulse 64    Ht 6\' 5"  (1.956 m)    Wt 295 lb (133.8 kg)    BMI 34.98 kg/m    VITAL SIGNS:  reviewed  ASSESSMENT & PLAN:    Chronic dyspnea on exertion most likely related to deconditioning and obesity.  Lexiscan Myoview 11/07/2019 was normal.  Echo normal LVEF 60 to 65% with grade 1 DD and mildly dilated aortic root.  Working out with a Clinical research associate now feeling better.  No complaints.  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  Blood pressures continue to run very high at times.  Will increase doxazosin  to 4 mg twice daily.  Discussed adding amlodipine but patient wanted to go up on doxazosin because of prostate benefits.  Will refer to Dr. Oval Lee for hypertension clinic  Obesity exercise and weight loss recommended working out with a trainer now and has lost 9 pounds in a month.  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  Will follow yearly.  COVID-19 Education: Has received his booster shot The signs and symptoms of COVID-19 were discussed with the patient and how to seek care for testing (follow up with PCP or arrange E-visit).   The importance of social distancing was discussed today.  Time:   Today, I have spent 9:45 minutes with the patient with telehealth technology discussing the above problems.     Medication Adjustments/Labs and Tests Ordered: Current medicines are reviewed at length with the patient today.  Concerns regarding medicines are outlined above.   Tests Ordered: No orders of the defined types were placed in this encounter.   Medication Changes: No orders of the defined types were placed in this encounter.   Follow Up:  In Person in 2 month(s) Dr. Tamala Lee, refer to Dr. Oval Lee HTN clinic  Signed, Ermalinda Barrios, PA-C  12/06/2019 8:13 AM    [place]

## 2019-12-06 ENCOUNTER — Encounter: Payer: Self-pay | Admitting: Physician Assistant

## 2019-12-06 ENCOUNTER — Telehealth (INDEPENDENT_AMBULATORY_CARE_PROVIDER_SITE_OTHER): Payer: Medicare PPO | Admitting: Physician Assistant

## 2019-12-06 ENCOUNTER — Other Ambulatory Visit: Payer: Self-pay

## 2019-12-06 VITALS — BP 123/78 | HR 64 | Ht 77.0 in | Wt 295.0 lb

## 2019-12-06 DIAGNOSIS — I10 Essential (primary) hypertension: Secondary | ICD-10-CM | POA: Diagnosis not present

## 2019-12-06 DIAGNOSIS — R0609 Other forms of dyspnea: Secondary | ICD-10-CM

## 2019-12-06 DIAGNOSIS — Z9889 Other specified postprocedural states: Secondary | ICD-10-CM

## 2019-12-06 DIAGNOSIS — I77810 Thoracic aortic ectasia: Secondary | ICD-10-CM

## 2019-12-06 DIAGNOSIS — R0600 Dyspnea, unspecified: Secondary | ICD-10-CM | POA: Diagnosis not present

## 2019-12-06 MED ORDER — DOXAZOSIN MESYLATE 4 MG PO TABS: 4.0000 mg | ORAL_TABLET | Freq: Two times a day (BID) | ORAL | 3 refills | Status: DC

## 2019-12-06 NOTE — Patient Instructions (Signed)
Medication Instructions:  Your physician has recommended you make the following change in your medication:   INCREASE: Doxasosin to 4mg  twice daily. A new prescription has been sent in to your pharmacy.   *If you need a refill on your cardiac medications before your next appointment, please call your pharmacy*   Lab Work: None If you have labs (blood work) drawn today and your tests are completely normal, you will receive your results only by:  [place] (if you have MyChart) OR  A paper copy in the mail If you have any lab test that is abnormal or we need to change your treatment, we will call you to review the results.   Follow-Up: At [facility], you and your health needs are our priority.  As part of our continuing mission to provide you with exceptional heart care, we have created designated Provider Care Teams.  These Care Teams include your primary Cardiologist (physician) and Advanced Practice Providers (APPs -  Physician Assistants and Nurse Practitioners) who all work together to provide you with the care you need, when you need it.  We recommend signing up for the patient portal called "MyChart".  Sign up information is provided on this After Visit Summary.  MyChart is used to connect with patients for Virtual Visits (Telemedicine).  Patients are able to view lab/test results, encounter notes, upcoming appointments, etc.  Non-urgent messages can be sent to your provider as well.   To learn more about what you can do with MyChart, go to [email or web address].    Your next appointment:   You have been referred to our Advanced Hypertension Clinic. Someone will contact you to schedule that appointment.  2 month(s)  (appt should be after you see Dr Oval Linsey)  The format for your next appointment:   In Person  Provider:   You may see Sinclair Grooms, MD or Ermalinda Barrios, PA-C

## 2019-12-08 ENCOUNTER — Encounter: Payer: Self-pay | Admitting: Podiatry

## 2019-12-08 ENCOUNTER — Other Ambulatory Visit: Payer: Self-pay

## 2019-12-08 ENCOUNTER — Ambulatory Visit: Payer: Medicare PPO | Admitting: Podiatry

## 2019-12-08 DIAGNOSIS — M722 Plantar fascial fibromatosis: Secondary | ICD-10-CM

## 2019-12-08 DIAGNOSIS — M7752 Other enthesopathy of left foot: Secondary | ICD-10-CM

## 2019-12-08 NOTE — Progress Notes (Signed)
Subjective:  Patient ID: Pedro Lee, male    DOB: Jan 09, 1941,  MRN: [id number]  Chief Complaint  Patient presents with   Plantar Fasciitis    PT stated that he is doing well and he has no concerns but is still having some pain in the left foot     79 y.o. male presents with the above complaint.  Patient presents with follow-up of bilateral plantar fasciitis.  Patient states he is doing well with those he has no pain in the heel anymore.  He has secondary complaint left fifth metatarsal phalangeal joint pain.  Patient states this has been going on for a little bit of time.  He states that it might be due to compensation from the heel pain.  He denies any other acute complaints.  Review of Systems: Negative except as noted in the HPI. Denies N/V/F/Ch.  Past Medical History:  Diagnosis Date   Allergy    sesonal   Complication of anesthesia    HX OF ENLARGED PROSTATE AND UNABLE TO PASS WATER AFTER ANESTHESIA   Dyspnea    with some exertion   Dysrhythmia    HX ATRIAL FIBRILLATION - HAD ABLATION - NO LONGER HAS AF.   Essential hypertension, malignant    Gout, unspecified    Obesity, mild    OSA (obstructive sleep apnea)    Mild - PT STATES HE DID NOT HAVE TO USE CPAP   Osteoarthritis    Osteoarthrosis, unspecified whether generalized or localized, unspecified site    Other dyspnea and respiratory abnormality    Prostate enlargement    DR. NESI IS PT'S UROLOGIST   Pure hypercholesterolemia    Renal insufficiency, mild    PT STATES NOT AWARE OF ANY KIDNEY PROBLEM    Current Outpatient Medications:    allopurinol (ZYLOPRIM) 300 MG tablet, Take 300 mg by mouth daily.  , Disp: , Rfl:    aspirin EC 81 MG tablet, Take 81 mg by mouth daily., Disp: , Rfl:    benazepril (LOTENSIN) 20 MG tablet, , Disp: , Rfl:    benazepril-hydrochlorthiazide (LOTENSIN HCT) 20-25 MG per tablet, Take 1 tablet by mouth every morning., Disp: , Rfl:    chlorthalidone (HYGROTON) 25 MG  tablet, , Disp: , Rfl:    ciprofloxacin (CIPRO) 250 MG tablet, , Disp: , Rfl:    colchicine 0.6 MG tablet, Take 0.6 mg by mouth as needed (gout). , Disp: , Rfl:    doxazosin (CARDURA) 4 MG tablet, Take 1 tablet (4 mg total) by mouth 2 (two) times daily., Disp: 180 tablet, Rfl: 3   metoprolol (TOPROL-XL) 100 MG 24 hr tablet, Take 100 mg by mouth daily. , Disp: , Rfl:    montelukast (SINGULAIR) 10 MG tablet, Take 10 mg by mouth daily., Disp: , Rfl: tm   OVER THE COUNTER MEDICATION, Place 1 drop into both eyes as needed (for dry eyes). Crocodile Tears, Disp: , Rfl:    rosuvastatin (CRESTOR) 10 MG tablet, TAKE 1 TABLET BY MOUTH EVERYDAY AT BEDTIME, Disp: , Rfl: 3   tamsulosin (FLOMAX) 0.4 MG CAPS capsule, Take 0.4 mg by mouth daily., Disp: , Rfl:   Social History   Tobacco Use  Smoking Status Never Smoker  Smokeless Tobacco Never Used    Allergies  Allergen Reactions   Penicillins Rash   Objective:  There were no vitals filed for this visit. There is no height or weight on file to calculate BMI. Constitutional Well developed. Well nourished.  Vascular Dorsalis pedis  pulses palpable bilaterally. Posterior tibial pulses palpable bilaterally. Capillary refill normal to all digits.  No cyanosis or clubbing noted. Pedal hair growth normal.  Neurologic Normal speech. Oriented to person, place, and time. Epicritic sensation to light touch grossly present bilaterally.  Dermatologic Nails well groomed and normal in appearance. No open wounds. No skin lesions.  Orthopedic: Normal joint ROM without pain or crepitus bilaterally. No visible deformities. No tender to palpation at the calcaneal tuber bilaterally. No pain with calcaneal squeeze bilaterally. Ankle ROM diminished range of motion bilaterally. Silfverskiold Test: positive bilaterally.  Left metatarsophalangeal joint with range of motion pain active and passive.  Pain on palpation of fifth MPJ.  No intra-articular pain  noted.  Mild tailor's bunion noted.   Radiographs: Taken and reviewed. No acute fractures or dislocations. No evidence of stress fracture.  Plantar heel spur present. Posterior heel spur present.   Assessment:   1. Plantar fasciitis of right foot   2. Plantar fasciitis of left foot   3. Capsulitis of metatarsophalangeal (MTP) joint of left foot    Plan:  Patient was evaluated and treated and all questions answered.  Plantar Fasciitis, bilaterally -Clinically the patient's plantar fasciitis is healed bilaterally.  One steroid injection plantar fascial brace has helped tremendously.  I discussed with him the importance of shoe gear modification.  He states understanding  Left fifth MPJ capsulitis -I explained patient the etiology of capsulitis and various treatment options were discussed.  This is likely due to compensatory mechanism from plantar fasciitis pain however given the amount of pain that is having I believe he will benefit from a steroid injection.  Patient agrees with the plan would like to proceed with a steroid injection. -A steroid injection was performed at left fifth MPJ using 1% plain Lidocaine and 10 mg of Kenalog. This was well tolerated.   No follow-ups on file.

## 2019-12-22 ENCOUNTER — Other Ambulatory Visit: Payer: Self-pay | Admitting: Cardiovascular Disease

## 2019-12-22 ENCOUNTER — Other Ambulatory Visit: Payer: Self-pay

## 2019-12-22 ENCOUNTER — Ambulatory Visit: Payer: Medicare PPO | Admitting: Cardiovascular Disease

## 2019-12-22 ENCOUNTER — Encounter: Payer: Self-pay | Admitting: Cardiovascular Disease

## 2019-12-22 VITALS — BP 126/76 | HR 60 | Ht 77.0 in | Wt 314.0 lb

## 2019-12-22 DIAGNOSIS — I5033 Acute on chronic diastolic (congestive) heart failure: Secondary | ICD-10-CM

## 2019-12-22 DIAGNOSIS — I5032 Chronic diastolic (congestive) heart failure: Secondary | ICD-10-CM | POA: Insufficient documentation

## 2019-12-22 DIAGNOSIS — I10 Essential (primary) hypertension: Secondary | ICD-10-CM

## 2019-12-22 DIAGNOSIS — R600 Localized edema: Secondary | ICD-10-CM

## 2019-12-22 DIAGNOSIS — I739 Peripheral vascular disease, unspecified: Secondary | ICD-10-CM

## 2019-12-22 HISTORY — DX: Essential (primary) hypertension: I10

## 2019-12-22 HISTORY — DX: Localized edema: R60.0

## 2019-12-22 HISTORY — DX: Acute on chronic diastolic (congestive) heart failure: I50.33

## 2019-12-22 MED ORDER — FUROSEMIDE 40 MG PO TABS: ORAL_TABLET | ORAL | 1 refills | Status: DC

## 2019-12-22 MED ORDER — CARVEDILOL 25 MG PO TABS: 25.0000 mg | ORAL_TABLET | Freq: Two times a day (BID) | ORAL | 3 refills | Status: DC

## 2019-12-22 NOTE — Patient Instructions (Signed)
Medication Instructions:  STOP METOPROLOL   START CARVEDILOL 25 MG TWICE A DAY   TAKE FUROSEMIDE 20 MG DAILY AS NEEDED FOR BAD SWELLING    Labwork: TSH/CORTISOL TODAY    Testing/Procedures: Your physician has requested that you have an ankle brachial index (ABI). During this test an ultrasound and blood pressure cuff are used to evaluate the arteries that supply the arms and legs with blood. Allow thirty minutes for this exam. There are no restrictions or special instructions.  Your physician has requested that you have a lower or upper extremity arterial duplex. This test is an ultrasound of the arteries in the legs or arms. It looks at arterial blood flow in the legs and arms. Allow one hour for Lower and Upper Arterial scans. There are no restrictions or special instructions  Your physician has requested that you have a renal artery duplex. During this test, an ultrasound is used to evaluate blood flow to the kidneys. Allow one hour for this exam. Do not eat after midnight the day before and avoid carbonated beverages. Take your medications as you usually do.   Follow-Up: 01/22/2020 AT 3 WITH PHARM D    You will receive a phone call from the PREP exercise and nutrition program to schedule an initial assessment.  Special Instructions:   MONITOR YOUR BLOOD PRESSURE TWICE A DAY, LOG IN THE BOOK PROVIDED. BRING THE BOOK AND YOUR BLOOD PRESSURE MACHINE TO YOUR FOLLOW UP IN 1 MONTH   DASH Eating Plan DASH stands for "Dietary Approaches to Stop Hypertension." The DASH eating plan is a healthy eating plan that has been shown to reduce high blood pressure (hypertension). It may also reduce your risk for type 2 diabetes, heart disease, and stroke. The DASH eating plan may also help with weight loss. What are tips for following this plan?  General guidelines  Avoid eating more than 2,300 mg (milligrams) of salt (sodium) a day. If you have hypertension, you may need to reduce your sodium  intake to 1,500 mg a day.  Limit alcohol intake to no more than 1 drink a day for nonpregnant women and 2 drinks a day for men. One drink equals 12 oz of beer, 5 oz of wine, or 1 oz of hard liquor.  Work with your health care provider to maintain a healthy body weight or to lose weight. Ask what an ideal weight is for you.  Get at least 30 minutes of exercise that causes your heart to beat faster (aerobic exercise) most days of the week. Activities may include walking, swimming, or biking.  Work with your health care provider or diet and nutrition specialist (dietitian) to adjust your eating plan to your individual calorie needs. Reading food labels   Check food labels for the amount of sodium per serving. Choose foods with less than 5 percent of the Daily Value of sodium. Generally, foods with less than 300 mg of sodium per serving fit into this eating plan.  To find whole grains, look for the word "whole" as the first word in the ingredient list. Shopping  Buy products labeled as "low-sodium" or "no salt added."  Buy fresh foods. Avoid canned foods and premade or frozen meals. Cooking  Avoid adding salt when cooking. Use salt-free seasonings or herbs instead of table salt or sea salt. Check with your health care provider or pharmacist before using salt substitutes.  Do not fry foods. Cook foods using healthy methods such as baking, boiling, grilling, and broiling instead.  Cook with heart-healthy oils, such as olive, canola, soybean, or sunflower oil. Meal planning  Eat a balanced diet that includes: ? 5 or more servings of fruits and vegetables each day. At each meal, try to fill half of your plate with fruits and vegetables. ? Up to 6-8 servings of whole grains each day. ? Less than 6 oz of lean meat, poultry, or fish each day. A 3-oz serving of meat is about the same size as a deck of cards. One egg equals 1 oz. ? 2 servings of low-fat dairy each day. ? A serving of nuts,  seeds, or beans 5 times each week. ? Heart-healthy fats. Healthy fats called Omega-3 fatty acids are found in foods such as flaxseeds and coldwater fish, like sardines, salmon, and mackerel.  Limit how much you eat of the following: ? Canned or prepackaged foods. ? Food that is high in trans fat, such as fried foods. ? Food that is high in saturated fat, such as fatty meat. ? Sweets, desserts, sugary drinks, and other foods with added sugar. ? Full-fat dairy products.  Do not salt foods before eating.  Try to eat at least 2 vegetarian meals each week.  Eat more home-cooked food and less restaurant, buffet, and fast food.  When eating at a restaurant, ask that your food be prepared with less salt or no salt, if possible. What foods are recommended? The items listed may not be a complete list. Talk with your dietitian about what dietary choices are best for you. Grains Whole-grain or whole-wheat bread. Whole-grain or whole-wheat pasta. Brown rice. [place]. Bulgur. Whole-grain and low-sodium cereals. Pita bread. Low-fat, low-sodium crackers. Whole-wheat flour tortillas. Vegetables Fresh or frozen vegetables (raw, steamed, roasted, or grilled). Low-sodium or reduced-sodium tomato and vegetable juice. Low-sodium or reduced-sodium tomato sauce and tomato paste. Low-sodium or reduced-sodium canned vegetables. Fruits All fresh, dried, or frozen fruit. Canned fruit in natural juice (without added sugar). Meat and other protein foods Skinless chicken or [place]. Ground chicken or [place]. Pork with fat trimmed off. Fish and seafood. Egg whites. Dried beans, peas, or lentils. Unsalted nuts, nut butters, and seeds. Unsalted canned beans. Lean cuts of beef with fat trimmed off. Low-sodium, lean deli meat. Dairy Low-fat (1%) or fat-free (skim) milk. Fat-free, low-fat, or reduced-fat cheeses. Nonfat, low-sodium ricotta or cottage cheese. Low-fat or nonfat yogurt. Low-fat, low-sodium cheese. Fats  and oils Soft margarine without trans fats. Vegetable oil. Low-fat, reduced-fat, or light mayonnaise and salad dressings (reduced-sodium). Canola, safflower, olive, soybean, and sunflower oils. Avocado. Seasoning and other foods Herbs. Spices. Seasoning mixes without salt. Unsalted popcorn and pretzels. Fat-free sweets. What foods are not recommended? The items listed may not be a complete list. Talk with your dietitian about what dietary choices are best for you. Grains Baked goods made with fat, such as croissants, muffins, or some breads. Dry pasta or rice meal packs. Vegetables Creamed or fried vegetables. Vegetables in a cheese sauce. Regular canned vegetables (not low-sodium or reduced-sodium). Regular canned tomato sauce and paste (not low-sodium or reduced-sodium). Regular tomato and vegetable juice (not low-sodium or reduced-sodium). Angie Fava. Olives. Fruits Canned fruit in a light or heavy syrup. Fried fruit. Fruit in cream or butter sauce. Meat and other protein foods Fatty cuts of meat. Ribs. Fried meat. Berniece Salines. Sausage. Bologna and other processed lunch meats. Salami. Fatback. Hotdogs. Bratwurst. Salted nuts and seeds. Canned beans with added salt. Canned or smoked fish. Whole eggs or egg yolks. Chicken or [place] with skin. Dairy Whole  or 2% milk, cream, and half-and-half. Whole or full-fat cream cheese. Whole-fat or sweetened yogurt. Full-fat cheese. Nondairy creamers. Whipped toppings. Processed cheese and cheese spreads. Fats and oils Butter. Stick margarine. Lard. Shortening. Ghee. Bacon fat. Tropical oils, such as coconut, palm kernel, or palm oil. Seasoning and other foods Salted popcorn and pretzels. Onion salt, garlic salt, seasoned salt, table salt, and sea salt. Worcestershire sauce. Tartar sauce. Barbecue sauce. Teriyaki sauce. Soy sauce, including reduced-sodium. Steak sauce. Canned and packaged gravies. Fish sauce. Oyster sauce. Cocktail sauce. Horseradish that you find on  the shelf. Ketchup. Mustard. Meat flavorings and tenderizers. Bouillon cubes. Hot sauce and Tabasco sauce. Premade or packaged marinades. Premade or packaged taco seasonings. Relishes. Regular salad dressings. Where to find more information:  National Heart, Lung, and [place]: https://wilson-eaton.com/  American Heart Association: www.heart.org Summary  The DASH eating plan is a healthy eating plan that has been shown to reduce high blood pressure (hypertension). It may also reduce your risk for type 2 diabetes, heart disease, and stroke.  With the DASH eating plan, you should limit salt (sodium) intake to 2,300 mg a day. If you have hypertension, you may need to reduce your sodium intake to 1,500 mg a day.  When on the DASH eating plan, aim to eat more fresh fruits and vegetables, whole grains, lean proteins, low-fat dairy, and heart-healthy fats.  Work with your health care provider or diet and nutrition specialist (dietitian) to adjust your eating plan to your individual calorie needs. This information is not intended to replace advice given to you by your health care provider. Make sure you discuss any questions you have with your health care provider. Document Released: 01/15/2011 Document Revised: 01/08/2017 Document Reviewed: 01/20/2016 Elsevier Patient Education  2020 [employer].

## 2019-12-22 NOTE — Telephone Encounter (Signed)
Rx has been sent to the pharmacy electronically. ° °

## 2019-12-22 NOTE — Progress Notes (Signed)
Hypertension Clinic Initial Assessment:    Date:  12/22/2019   ID:  Pedro Lee, DOB 1940-03-21, MRN [id number]  PCP:  Pedro Blocker, MD  Cardiologist:  Pedro Grooms, MD   Referring MD: Pedro Burn, PA-C   CC: Hypertension  History of Present Illness:    Pedro Lee is a 79 y.o. male with a hx of obesity, hypertension, AVNRT s/p ablation and pituitary tumor here to establish care in the hypertension clinic.  He saw Pedro Lee in 2019 for exertional dyspnea.  His symptoms were thought to be due to deconditioning, hypertension, and obesity.  He subsequently followed up with Pedro Husk, PA-C, on 10/2019.  At that time his blood pressure was running high.  Prior to that he had seen his PCP and doxazosin was reduced due to low blood pressure and exertional dyspnea.  He had a [employer] 10/2019 that was negative for ischemia.  Systolic function was normal.  He saw Pedro Lee back 11/2019 and his doxazosin was increased to 2 mg twice daily.  He had a virtual follow-up 11/2019 and his blood pressure was running high in the evenings.  He was working out and limiting his sodium intake.  They decided to increase his doxazosin because he was also struggling with his prostate.  He was referred to advanced hypertension clinic for further management.  Lately his BP has been ranging 130-170s/70-90s.  He has been mostly checking it in the mornings.  He got a new machine about 2 months ago.  Pedro Lee had his pituitary tumor removed about 5 years ago.  He does not think he follows up with endocrinology.  He has generally been feeling well.  He goes to 21-minute workout twice per week and does mostly isometric exercises.  He has no chest pain or shortness of breath with this activity.  He has chronic lower extremity edema and some darkening around his ankles that he worries about.  He denies any pain when walking.  He had ABIs in 2015 that were mildly abnormal.  She he denies any orthopnea  or PND.  He does not think that he snores.  He sometimes feels rested in the mornings but often wakes up frequently during the night to urinate.  He does not drink much caffeine and mostly eats at home.  He tries to limit his salt intake.  He lost his wife of 29 years in May and has struggled since that time.   Past Medical History:  Diagnosis Date  . Allergy    sesonal  . Complication of anesthesia    HX OF ENLARGED PROSTATE AND UNABLE TO PASS WATER AFTER ANESTHESIA  . Dyspnea    with some exertion  . Dysrhythmia    HX ATRIAL FIBRILLATION - HAD ABLATION - NO LONGER HAS AF.  Marland Kitchen Essential hypertension 12/22/2019  . Essential hypertension, malignant   . Gout, unspecified   . Lower extremity edema 12/22/2019  . Obesity, mild   . OSA (obstructive sleep apnea)    Mild - PT STATES HE DID NOT HAVE TO USE CPAP  . Osteoarthritis   . Osteoarthrosis, unspecified whether generalized or localized, unspecified site   . Other dyspnea and respiratory abnormality   . Prostate enlargement    DR. NESI IS PT'S UROLOGIST  . Pure hypercholesterolemia   . Renal insufficiency, mild    PT STATES NOT AWARE OF ANY KIDNEY PROBLEM    Past Surgical History:  Procedure Laterality Date  .  COLONOSCOPY    . CRANIOTOMY N/A 03/24/2016   Procedure: Transphenoidal Resection of Tumor;  Surgeon: Earnie Larsson, MD;  Location: [facility];  Service: Neurosurgery;  Laterality: N/A;  Transphenoidal Resection of Tumor  . HEART ABLATION FOR AF  1998  . JOINT REPLACEMENT     LEFT TOTAL KNEE REPLACEMENT  . KNEE ARTHROPLASTY Left 2013ish  . LEFT KNEE ARTHROSCOPY    . TOTAL KNEE ARTHROPLASTY Right 05/30/2013   Procedure: RIGHT TOTAL KNEE ARTHROPLASTY;  Surgeon: Mauri Pole, MD;  Location: [facility];  Service: Orthopedics;  Laterality: Right;  . TRANSNASAL APPROACH N/A 03/24/2016   Procedure: TRANSNASAL APPROACH;  Surgeon: Rozetta Nunnery, MD;  Location: [facility] OR;  Service: ENT;  Laterality: N/A;  TRANSNASAL APPROACH    Current  Medications: Current Meds  Medication Sig  . allopurinol (ZYLOPRIM) 300 MG tablet Take 300 mg by mouth daily.    Marland Kitchen aspirin EC 81 MG tablet Take 81 mg by mouth daily.  . benazepril (LOTENSIN) 20 MG tablet   . benazepril-hydrochlorthiazide (LOTENSIN HCT) 20-25 MG per tablet Take 1 tablet by mouth every morning.  . chlorthalidone (HYGROTON) 25 MG tablet   . colchicine 0.6 MG tablet Take 0.6 mg by mouth as needed (gout).   Marland Kitchen doxazosin (CARDURA) 4 MG tablet Take 1 tablet (4 mg total) by mouth 2 (two) times daily.  . montelukast (SINGULAIR) 10 MG tablet Take 10 mg by mouth daily.  . rosuvastatin (CRESTOR) 10 MG tablet TAKE 1 TABLET BY MOUTH EVERYDAY AT BEDTIME  . tamsulosin (FLOMAX) 0.4 MG CAPS capsule Take 0.4 mg by mouth daily.  . [DISCONTINUED] ciprofloxacin (CIPRO) 250 MG tablet   . [DISCONTINUED] metoprolol (TOPROL-XL) 100 MG 24 hr tablet Take 100 mg by mouth daily.   . [DISCONTINUED] OVER THE COUNTER MEDICATION Place 1 drop into both eyes as needed (for dry eyes). Crocodile Tears     Allergies:   Penicillins   Social History   Socioeconomic History  . Marital status: Married    Spouse name: Not on file  . Number of children: Not on file  . Years of education: Not on file  . Highest education level: Not on file  Occupational History  . Not on file  Tobacco Use  . Smoking status: Never Smoker  . Smokeless tobacco: Never Used  Vaping Use  . Vaping Use: Never used  Substance and Sexual Activity  . Alcohol use: No    Alcohol/week: 0.0 standard drinks  . Drug use: No  . Sexual activity: Not on file  Other Topics Concern  . Not on file  Social History Narrative  . Not on file   Social Determinants of Health   Financial Resource Strain:   . Difficulty of Paying Living Expenses: Not on file  Food Insecurity:   . Worried About Charity fundraiser in the Last Year: Not on file  . Ran Out of Food in the Last Year: Not on file  Transportation Needs:   . Lack of Transportation  (Medical): Not on file  . Lack of Transportation (Non-Medical): Not on file  Physical Activity:   . Days of Exercise per Week: Not on file  . Minutes of Exercise per Session: Not on file  Stress:   . Feeling of Stress : Not on file  Social Connections:   . Frequency of Communication with Friends and Family: Not on file  . Frequency of Social Gatherings with Friends and Family: Not on file  . Attends Religious Services:  Not on file  . Active Member of Clubs or Organizations: Not on file  . Attends Archivist Meetings: Not on file  . Marital Status: Not on file     Family History: The patient's family history includes Heart disease (age of onset: 3) in his father.  ROS:   Please see the history of present illness.    All other systems reviewed and are negative.  EKGs/Labs/Other Studies Reviewed:    EKG:  EKG is not ordered today.    Recent Labs: No results found for requested labs within last 8760 hours.   Recent Lipid Panel No results found for: CHOL, TRIG, HDL, CHOLHDL, VLDL, LDLCALC, LDLDIRECT  Physical Exam:    VS:  BP 126/76 (BP Location: Left Arm, Patient Position: Sitting, Cuff Size: Large)   Pulse 60   Ht 6\' 5"  (1.956 m)   Wt (!) 314 lb (142.4 kg)   SpO2 96%   BMI 37.23 kg/m  , BMI Body mass index is 37.23 kg/m. GENERAL:  Well appearing HEENT: Pupils equal round and reactive, fundi not visualized, oral mucosa unremarkable NECK:  No jugular venous distention, waveform within normal limits, carotid upstroke brisk and symmetric, no bruits LUNGS:  Clear to auscultation bilaterally HEART:  RRR.  PMI not displaced or sustained,S1 and S2 within normal limits, no S3, no S4, no clicks, no rubs, no murmurs ABD:  Flat, positive bowel sounds normal in frequency in pitch, no bruits, no rebound, no guarding, no midline pulsatile mass, no hepatomegaly, no splenomegaly EXT:  2 plus right DP pulse.  Unable to palpate left DP/TP or right TP.  Suspect edema is  contributing.  2+ LE edema to the mid tibia bilaterally, no cyanosis no clubbing SKIN:  No rashes no nodules NEURO:  Cranial nerves II through XII grossly intact, motor grossly intact throughout PSYCH:  Cognitively intact, oriented to person place and time  ASSESSMENT:    1. Essential hypertension   2. Claudication in peripheral vascular disease ([place])   3. 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  His blood pressure was well-controlled today.  However his blood pressures at home seem to remain quite elevated.  He did get a new blood pressure cuff a couple months ago.  Of asked him to bring it to follow-up to make sure it is reading accurately.  For now we will continue benazepril, HCTZ, chlorthalidone, doxazosin.  We will change metoprolol to carvedilol 25 mg twice daily.  He also has a history of pituitary surgery.  We will check thyroid function and a cortisol level this morning.  We will refer him for renal artery Dopplers.  His blood pressure was symmetric.  It does not sound as though he snores so we will not get a sleep study for now.  He is interested in the PREP exercise program through [facility].  We did offer stress management, especially in light of his wife's recent passing.  However he declines.  Secondary Causes of Hypertension  Medications/Herbal: OCP, steroids, stimulants, antidepressants, weight loss medication, immune suppressants, NSAIDs, sympathomimetics, alcohol, caffeine, licorice, ginseng, St. John's wort, chemo  Sleep Apnea: doesn't snore Renal artery stenosis: check renal artery Dopplers  hyperaldosteronism: Testing deferred.  No hypokalemia and would prefer not to hold his ACE inhibitor for now. Hyper/hypothyroidism: Check TSH today Pheochromocytoma: testing not indicated Cushing's syndrome: Check cortisol Coarctation of the aorta: Blood pressure symmetric  #  LE  edema: # Abnormal ABIs: Suspect his symptoms are mostly attributable to venous stasis.  Continue lower extremity compression socks.  He has no heart failure symptoms.  We will give him 40 mg of Lasix to take as needed if the swelling gets really bad.  Given that his ABIs were mildly abnormal in 2015, will order repeat ABIs and Dopplers.   Disposition:    FU with MD/PharmD in 1 month    Medication Adjustments/Labs and Tests Ordered: Current medicines are reviewed at length with the patient today.  Concerns regarding medicines are outlined above.  Orders Placed This Encounter  Procedures  . TSH  . Cortisol  . VAS US RENAL ARTERY DUPLEX  . VAS [place] ABI WITH/WO TBI  . VAS [place] LOWER EXTREMITY ARTERIAL DUPLEX   Meds ordered this encounter  Medications  . carvedilol (COREG) 25 MG tablet    Sig: Take 1 tablet (25 mg total) by mouth 2 (two) times daily.    Dispense:  180 tablet    Refill:  3    D/C METOPROLOL  . DISCONTD: furosemide (LASIX) 40 MG tablet    Sig: DAILY AS NEEDED FOR BAD SWELLING    Dispense:  30 tablet    Refill:  1     Signed, Skeet Latch, MD  12/22/2019 12:45 PM    [place]

## 2019-12-23 LAB — CORTISOL: Cortisol: 7.2 ug/dL

## 2019-12-23 LAB — TSH: TSH: 2.07 u[IU]/mL (ref 0.450–4.500)

## 2019-12-29 ENCOUNTER — Telehealth: Payer: Self-pay

## 2019-12-29 NOTE — Telephone Encounter (Signed)
Call placed to pt reference PREP referral Explained program, interested in starting Needs Evening class Will call back at start of next evening class at [facility]

## 2020-01-22 ENCOUNTER — Ambulatory Visit (HOSPITAL_COMMUNITY)
Admission: RE | Admit: 2020-01-22 | Discharge: 2020-01-22 | Disposition: A | Discharge status: Home / Self Care | Payer: Medicare PPO | Source: Ambulatory Visit | Attending: Cardiology | Admitting: Cardiology

## 2020-01-22 ENCOUNTER — Other Ambulatory Visit: Payer: Self-pay

## 2020-01-22 ENCOUNTER — Ambulatory Visit (HOSPITAL_BASED_OUTPATIENT_CLINIC_OR_DEPARTMENT_OTHER)
Admission: RE | Admit: 2020-01-22 | Discharge: 2020-01-22 | Disposition: A | Discharge status: Home / Self Care | Payer: Medicare PPO | Source: Ambulatory Visit | Attending: Cardiovascular Disease | Admitting: Cardiovascular Disease

## 2020-01-22 ENCOUNTER — Ambulatory Visit (INDEPENDENT_AMBULATORY_CARE_PROVIDER_SITE_OTHER): Payer: Medicare PPO | Admitting: Pharmacist Clinician (PhC)/ Clinical Pharmacy Specialist

## 2020-01-22 VITALS — BP 136/72 | HR 46 | Ht 77.0 in | Wt 314.0 lb

## 2020-01-22 DIAGNOSIS — I739 Peripheral vascular disease, unspecified: Secondary | ICD-10-CM

## 2020-01-22 DIAGNOSIS — I10 Essential (primary) hypertension: Secondary | ICD-10-CM | POA: Diagnosis not present

## 2020-01-22 NOTE — Progress Notes (Signed)
01/23/2020 CHIRAG KRUEGER Dec 18, 1940 [id number]   HPI:  Pedro Lee is a 79 y.o. male patient of Dr Oval Linsey,  presents today for advanced hypertension clinic follow up.  He was referred by Dr. Tamala Julian for hard to control pressure, which was running higher at home than in the office.  He has recently purchased a newer cuff and was asked to bring it with him today.   His history is significant for pituitary surgery, recent TSH and cortisol levels were WNL.  Patient reports having been diagnosed with hypertension only 2-3 years ago, but notes that it has not been well controlled as of yet.     In office today patient cannot recall his meds by name or description.  He believes he is taking 2 of the carvedilol tablets twice daily, and does not know if the benazepril is combined with hydrochlorothiazide or not.  Patient returned call and medication list was updated.  Also we had no current labs for him, with the most recent being from early 2018.  Sent request to his PCP and labs were faxed over.   Blood Pressure Goal:  130/80  Current Medications: benazepril 20 mg qd, doxazosin4 mg bid, carvedilol 25 mg bid, furosemide 40 mg prn  Family Hx: father died 66, heart disease, mother 53 old age; brother now 67, sister 35 both living without heat issues; one son, no known health issues  Social Hx: no, no, coffee 1-1.5 cups per day home brewed  Diet: mostly home cooked, some salt with cooking, none at table; mix of f/v; rarely deep fried  Exercise: [employer]; 2 day/week program; more resistance, will do some cardiovascular starting Feb  Home BP readings: home cuff Omron, recently purchased  94-191/60-103  Intolerances: penicillin  Labs: 8/21: Na 145, K 4.8, Glu 94, BUN 16, Scr 1.41 GFR 55  Wt Readings from Last 3 Encounters:  01/22/20 (!) 314 lb (142.4 kg)  12/22/19 (!) 314 lb (142.4 kg)  12/06/19 295 lb (133.8 kg)   BP Readings from Last 3 Encounters:  01/22/20 136/72  12/22/19 126/76   12/06/19 123/78   Pulse Readings from Last 3 Encounters:  01/22/20 (!) 46  12/22/19 60  12/06/19 64    Current Outpatient Medications  Medication Sig Dispense Refill   allopurinol (ZYLOPRIM) 300 MG tablet Take 300 mg by mouth daily.     aspirin EC 81 MG tablet Take 81 mg by mouth daily.     benazepril (LOTENSIN) 20 MG tablet      carvedilol (COREG) 25 MG tablet Take 1 tablet (25 mg total) by mouth 2 (two) times daily. 180 tablet 3   colchicine 0.6 MG tablet Take 0.6 mg by mouth as needed (gout).     doxazosin (CARDURA) 4 MG tablet Take 1 tablet (4 mg total) by mouth 2 (two) times daily. 180 tablet 3   furosemide (LASIX) 40 MG tablet TAKE 1 TABLET BY MOUTH DAILY AS NEEDED FOR SWELLING 90 tablet 0   montelukast (SINGULAIR) 10 MG tablet Take 10 mg by mouth daily.  tm   rosuvastatin (CRESTOR) 10 MG tablet TAKE 1 TABLET BY MOUTH EVERYDAY AT BEDTIME  3   tamsulosin (FLOMAX) 0.4 MG CAPS capsule Take 0.4 mg by mouth daily.     No current facility-administered medications for this visit.    Allergies  Allergen Reactions   Penicillins Rash    Past Medical History:  Diagnosis Date   Allergy    sesonal   Complication  of anesthesia    HX OF ENLARGED PROSTATE AND UNABLE TO PASS WATER AFTER ANESTHESIA   Dyspnea    with some exertion   Dysrhythmia    HX ATRIAL FIBRILLATION - HAD ABLATION - NO LONGER HAS AF.   Essential hypertension 12/22/2019   Essential hypertension, malignant    Gout, unspecified    Lower extremity edema 12/22/2019   Obesity, mild    OSA (obstructive sleep apnea)    Mild - PT STATES HE DID NOT HAVE TO USE CPAP   Osteoarthritis    Osteoarthrosis, unspecified whether generalized or localized, unspecified site    Other dyspnea and respiratory abnormality    Prostate enlargement    DR. NESI IS PT'S UROLOGIST   Pure hypercholesterolemia    Renal insufficiency, mild    PT STATES NOT AWARE OF ANY KIDNEY PROBLEM    Blood pressure 136/72,  pulse (!) 46, height 6\' 5"  (1.956 m), weight (!) 314 lb (142.4 kg).  Essential hypertension Spoke with patient by phone after this visit to confirm medication list.  Based on that information, will increase benazepril to 40 mg once daily and cut carvedilol to 12.5 mg bid.  This should hopefully decrease BP slightly while increasing heart rate.  He should continue to monitor vitals at home and return in 4 weeks for follow up.  He will need to have metabolic panel drawn in 2 weeks   Tommy Medal PharmD CPP [facility] [address] [place], [place] [address] [phone]

## 2020-01-22 NOTE — Patient Instructions (Signed)
Return for a a follow up appointment January 11 at 9 am   Call Erasmo Downer at [phone] when you get home and we can review your medicaiton list  Check your blood pressure at home twice daily and keep record of the readings.  Take your BP meds as follows:  We will determine this once we talk  Bring all of your meds, your BP cuff and your record of home blood pressures to your next appointment.  Exercise as you're able, try to walk approximately 30 minutes per day.  Keep salt intake to a minimum, especially watch canned and prepared boxed foods.  Eat more fresh fruits and vegetables and fewer canned items.  Avoid eating in fast food restaurants.    HOW TO TAKE YOUR BLOOD PRESSURE: . Rest 5 minutes before taking your blood pressure. .  Don't smoke or drink caffeinated beverages for at least 30 minutes before. . Take your blood pressure before (not after) you eat. . Sit comfortably with your back supported and both feet on the floor (don't cross your legs). . Elevate your arm to heart level on a table or a desk. . Use the proper sized cuff. It should fit smoothly and snugly around your bare upper arm. There should be enough room to slip a fingertip under the cuff. The bottom edge of the cuff should be 1 inch above the crease of the elbow. . Ideally, take 3 measurements at one sitting and record the average.

## 2020-01-23 ENCOUNTER — Encounter: Payer: Self-pay | Admitting: Pharmacist Clinician (PhC)/ Clinical Pharmacy Specialist

## 2020-01-23 NOTE — Assessment & Plan Note (Signed)
Spoke with patient by phone after this visit to confirm medication list.  Based on that information, will increase benazepril to 40 mg once daily and cut carvedilol to 12.5 mg bid.  This should hopefully decrease BP slightly while increasing heart rate.  He should continue to monitor vitals at home and return in 4 weeks for follow up.  He will need to have metabolic panel drawn in 2 weeks

## 2020-01-24 ENCOUNTER — Telehealth: Payer: Self-pay

## 2020-01-24 NOTE — Telephone Encounter (Signed)
Patient called to speak with Erasmo Downer about some medications that he was supposed to get from pharmacy after 12/14 visit.    Apparently, they were unavailable.  I told him that she could reach out to him upon her return on 12/16.  He verbalized understanding.

## 2020-01-25 ENCOUNTER — Other Ambulatory Visit: Payer: Self-pay | Admitting: Pharmacist Clinician (PhC)/ Clinical Pharmacy Specialist

## 2020-01-25 MED ORDER — BENAZEPRIL HCL 40 MG PO TABS: 40.0000 mg | ORAL_TABLET | Freq: Every day | ORAL | 3 refills | Status: DC

## 2020-01-25 MED ORDER — CARVEDILOL 12.5 MG PO TABS: 12.5000 mg | ORAL_TABLET | Freq: Two times a day (BID) | ORAL | 3 refills | Status: DC

## 2020-01-25 NOTE — Telephone Encounter (Signed)
[facility] - prescriptions sent to [facility] on [address]

## 2020-01-29 ENCOUNTER — Other Ambulatory Visit: Payer: Self-pay | Admitting: *Deleted

## 2020-01-29 ENCOUNTER — Telehealth: Payer: Self-pay | Admitting: *Deleted

## 2020-01-29 DIAGNOSIS — I701 Atherosclerosis of renal artery: Secondary | ICD-10-CM

## 2020-01-29 NOTE — Telephone Encounter (Signed)
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  Normal blood flow in the right kidney.  Repeat study in one year.

## 2020-01-29 NOTE — Telephone Encounter (Signed)
-----   Message from Skeet Latch, MD sent at 01/23/2020 12:00 AM EST ----- Normal blood flow to both legs.

## 2020-01-29 NOTE — Telephone Encounter (Signed)
Left message to call back  

## 2020-01-30 NOTE — Telephone Encounter (Signed)
Earvin Hansen, LPN  75/[id number]/3329 5:18 PM EST      Advised patient of results

## 2020-02-20 ENCOUNTER — Ambulatory Visit (INDEPENDENT_AMBULATORY_CARE_PROVIDER_SITE_OTHER): Payer: Medicare PPO | Admitting: Pharmacist Clinician (PhC)/ Clinical Pharmacy Specialist

## 2020-02-20 ENCOUNTER — Other Ambulatory Visit: Payer: Self-pay

## 2020-02-20 DIAGNOSIS — I10 Essential (primary) hypertension: Secondary | ICD-10-CM | POA: Diagnosis not present

## 2020-02-20 MED ORDER — HYDRALAZINE HCL 25 MG PO TABS: 25.0000 mg | ORAL_TABLET | Freq: Two times a day (BID) | ORAL | 5 refills | Status: DC

## 2020-02-20 NOTE — Progress Notes (Signed)
02/21/2020 Pedro Lee 09-May-1940 [id number]   HPI:  Pedro Lee is a 80 y.o. male patient of Dr Oval Linsey,  presents today for advanced hypertension clinic follow up.  He was referred by Dr. Tamala Lee for hard to control pressure, which was running higher at home than in the office.  He has recently purchased a newer cuff and was asked to bring it with him today.   His history is significant for pituitary surgery, recent TSH and cortisol levels were WNL.  Patient reports having been diagnosed with hypertension only 2-3 years ago, but notes that it has not been well controlled as of yet.   At his first CVRR visit he could not recall his medications by name or description.  We had to have him call back to the office afterward because of confusion.  His blood pressure that day was close to goal at 136/72 and no changes were made.  It was stressed that he bring his meds or updated list to his next appointment.    Today he returns for follow up.  He continues to note higher readings at home than in the office (see below).  He notes that other than arthritis in his shoulder that limits some activity, he is doing well.    Blood Pressure Goal:  130/80  Current Medications: benazepril 20 mg bid, doxazosin 4 mg bid, carvedilol 25 mg bid, furosemide 40 mg prn  Family Hx: father died 78, heart disease, mother 10 old age; brother now 51, sister 67 both living without heat issues; one son, no known health issues  Social Hx: no, no, coffee 1-1.5 cups per day home brewed  Diet: mostly home cooked, some salt with cooking, none at table; mix of f/v; rarely deep fried  Exercise: [employer]; 2 day/week program; more resistance, will do some cardiovascular starting Feb  Home BP readings: home cuff Omron, noted to be within 5 points at last office visit.  Still has a wide range of home readings (AM - 132-199/75-109, PM 133-198/74-107).  Average AM reading was 162/92 (15 readings) and PM 161/90 (7  readings)  Intolerances: penicillin; avoid amlodipine d/t chronic lower extremity edema  Labs: 8/21: Na 145, K 4.8, Glu 94, BUN 16, Scr 1.41 GFR 55  Wt Readings from Last 3 Encounters:  02/20/20 (!) 311 lb (141.1 kg)  01/22/20 (!) 314 lb (142.4 kg)  12/22/19 (!) 314 lb (142.4 kg)   BP Readings from Last 3 Encounters:  02/20/20 140/80  01/22/20 136/72  12/22/19 126/76   Pulse Readings from Last 3 Encounters:  02/20/20 68  01/22/20 (!) 46  12/22/19 60    Current Outpatient Medications  Medication Sig Dispense Refill  . allopurinol (ZYLOPRIM) 300 MG tablet Take 300 mg by mouth daily.    Marland Kitchen aspirin EC 81 MG tablet Take 81 mg by mouth daily.    . carvedilol (COREG) 25 MG tablet Take 25 mg by mouth 2 (two) times daily with a meal.    . colchicine 0.6 MG tablet Take 0.6 mg by mouth as needed (gout).    Marland Kitchen doxazosin (CARDURA) 4 MG tablet Take 1 tablet (4 mg total) by mouth 2 (two) times daily. 180 tablet 3  . fluticasone (FLONASE) 50 MCG/ACT nasal spray 2 sprays daily as needed for allergies or rhinitis.    . furosemide (LASIX) 40 MG tablet TAKE 1 TABLET BY MOUTH DAILY AS NEEDED FOR SWELLING 90 tablet 0  . hydrALAZINE (APRESOLINE) 25 MG tablet Take  1 tablet (25 mg total) by mouth in the morning and at bedtime. 60 tablet 5  . rosuvastatin (CRESTOR) 20 MG tablet Take 20 mg by mouth daily.    . montelukast (SINGULAIR) 10 MG tablet Take 10 mg by mouth daily.  tm  . tamsulosin (FLOMAX) 0.4 MG CAPS capsule Take 0.4 mg by mouth daily.     No current facility-administered medications for this visit.    Allergies  Allergen Reactions  . Penicillins Rash    Past Medical History:  Diagnosis Date  . Allergy    sesonal  . Complication of anesthesia    HX OF ENLARGED PROSTATE AND UNABLE TO PASS WATER AFTER ANESTHESIA  . Dyspnea    with some exertion  . Dysrhythmia    HX ATRIAL FIBRILLATION - HAD ABLATION - NO LONGER HAS AF.  Marland Kitchen Essential hypertension 12/22/2019  . Essential hypertension,  malignant   . Gout, unspecified   . Lower extremity edema 12/22/2019  . Obesity, mild   . OSA (obstructive sleep apnea)    Mild - PT STATES HE DID NOT HAVE TO USE CPAP  . Osteoarthritis   . Osteoarthrosis, unspecified whether generalized or localized, unspecified site   . Other dyspnea and respiratory abnormality   . Prostate enlargement    DR. NESI IS PT'S UROLOGIST  . Pure hypercholesterolemia   . Renal insufficiency, mild    PT STATES NOT AWARE OF ANY KIDNEY PROBLEM    Blood pressure 140/80, pulse 68, height 6\' 5"  (1.956 m), weight (!) 311 lb (141.1 kg).  Essential hypertension Patient with possible masked hypertension.  His readings in the office are consistently showing 20 or more points lower than home averages.   Still not at goal in office today.  Cannot add amlodipine due to chronic LEE, so will try adding hydralazine 25 mg twice daily.  He will continue to monitor home readings and return in 1 month for follow up.  Reviewed technique for home blood pressure readings, as his range is still quite large.     Tommy Medal PharmD CPP [facility] [address] [place], [place] [address] [phone]

## 2020-02-20 NOTE — Patient Instructions (Signed)
Return for a a follow up appointment February 8 at 8:30 am  Check your blood pressure at home daily (if able) and keep record of the readings.  Take your BP meds as follows:  Start hydralazine 25 mg twice daily  Continue with all other medications  Bring all of your meds, your BP cuff and your record of home blood pressures to your next appointment.  Exercise as you're able, try to walk approximately 30 minutes per day.  Keep salt intake to a minimum, especially watch canned and prepared boxed foods.  Eat more fresh fruits and vegetables and fewer canned items.  Avoid eating in fast food restaurants.    HOW TO TAKE YOUR BLOOD PRESSURE: . Rest 5 minutes before taking your blood pressure. .  Don't smoke or drink caffeinated beverages for at least 30 minutes before. . Take your blood pressure before (not after) you eat. . Sit comfortably with your back supported and both feet on the floor (don't cross your legs). . Elevate your arm to heart level on a table or a desk. . Use the proper sized cuff. It should fit smoothly and snugly around your bare upper arm. There should be enough room to slip a fingertip under the cuff. The bottom edge of the cuff should be 1 inch above the crease of the elbow. . Ideally, take 3 measurements at one sitting and record the average.

## 2020-02-21 ENCOUNTER — Encounter: Payer: Self-pay | Admitting: Pharmacist Clinician (PhC)/ Clinical Pharmacy Specialist

## 2020-02-21 NOTE — Assessment & Plan Note (Signed)
Patient with possible masked hypertension.  His readings in the office are consistently showing 20 or more points lower than home averages.   Still not at goal in office today.  Cannot add amlodipine due to chronic LEE, so will try adding hydralazine 25 mg twice daily.  He will continue to monitor home readings and return in 1 month for follow up.  Reviewed technique for home blood pressure readings, as his range is still quite large.

## 2020-03-13 ENCOUNTER — Ambulatory Visit: Payer: Medicare PPO | Admitting: Podiatry

## 2020-03-13 ENCOUNTER — Encounter: Payer: Self-pay | Admitting: Podiatry

## 2020-03-13 ENCOUNTER — Other Ambulatory Visit: Payer: Self-pay

## 2020-03-13 DIAGNOSIS — M722 Plantar fascial fibromatosis: Secondary | ICD-10-CM

## 2020-03-13 DIAGNOSIS — M7731 Calcaneal spur, right foot: Secondary | ICD-10-CM | POA: Diagnosis not present

## 2020-03-19 ENCOUNTER — Ambulatory Visit: Payer: Medicare PPO

## 2020-03-19 ENCOUNTER — Encounter: Payer: Self-pay | Admitting: Podiatry

## 2020-03-19 NOTE — Progress Notes (Deleted)
03/19/2020 Pedro Lee [birth year] [id number]   HPI:  Pedro Lee is a 80 y.o. male patient of Dr Oval Linsey,  presents today for advanced hypertension clinic follow up.  He was referred by Dr. Tamala Julian for hard to control pressure, which was running higher at home than in the office.  He has recently purchased a newer cuff and was asked to bring it with him today.   His history is significant for pituitary surgery, recent TSH and cortisol levels were WNL.  Patient reports having been diagnosed with hypertension only 2-3 years ago, but notes that it has not been well controlled as of yet.   At his first CVRR visit he could not recall his medications by name or description.  We had to have him call back to the office afterward because of confusion.  His blood pressure that day was close to goal at 136/72 and no changes were made.  It was stressed that he bring his meds or updated list to his next appointment.    Today he returns for follow up.  He continues to note higher readings at home than in the office (see below).  He notes that other than arthritis in his shoulder that limits some activity, he is doing well.    At his last visit on Jan 11 his pressure was slightly elevated at 142/76, hydralazine 25 mg bid was added to his regimen.    Blood Pressure Goal:  130/80  Current Medications: benazepril 20 mg bid, doxazosin 4 mg bid, carvedilol 25 mg bid, hydralazine 25 mg bid, furosemide 40 mg prn  Family Hx: father died 100, heart disease, mother 61 old age; brother now 13, sister 46 both living without heat issues; one son, no known health issues  Social Hx: no, no, coffee 1-1.5 cups per day home brewed  Diet: mostly home cooked, some salt with cooking, none at table; mix of f/v; rarely deep fried  Exercise: [employer]; 2 day/week program; more resistance, will do some cardiovascular starting Feb  Home BP readings: home cuff Omron, noted to be within 5 points at last office visit.  Still  has a wide range of home readings (AM - 132-199/75-109, PM 133-198/74-107).  Average AM reading was 162/92 (15 readings) and PM 161/90 (7 readings)  Intolerances: penicillin; avoid amlodipine d/t chronic lower extremity edema  Labs: 8/21: Na 145, K 4.8, Glu 94, BUN 16, Scr 1.41 GFR 55  Wt Readings from Last 3 Encounters:  02/20/20 (!) 311 lb (141.1 kg)  01/22/20 (!) 314 lb (142.4 kg)  12/22/19 (!) 314 lb (142.4 kg)   BP Readings from Last 3 Encounters:  02/20/20 (!) 142/76  01/22/20 136/72  12/22/19 126/76   Pulse Readings from Last 3 Encounters:  02/20/20 68  01/22/20 (!) 46  12/22/19 60    Current Outpatient Medications  Medication Sig Dispense Refill  . allopurinol (ZYLOPRIM) 300 MG tablet Take 300 mg by mouth daily.    Marland Kitchen aspirin EC 81 MG tablet Take 81 mg by mouth daily.    . carvedilol (COREG) 25 MG tablet Take 25 mg by mouth 2 (two) times daily with a meal.    . colchicine 0.6 MG tablet Take 0.6 mg by mouth as needed (gout).    Marland Kitchen doxazosin (CARDURA) 4 MG tablet Take 1 tablet (4 mg total) by mouth 2 (two) times daily. 180 tablet 3  . fluticasone (FLONASE) 50 MCG/ACT nasal spray 2 sprays daily as needed for allergies or  rhinitis.    . furosemide (LASIX) 40 MG tablet TAKE 1 TABLET BY MOUTH DAILY AS NEEDED FOR SWELLING 90 tablet 0  . hydrALAZINE (APRESOLINE) 25 MG tablet Take 1 tablet (25 mg total) by mouth in the morning and at bedtime. 60 tablet 5  . montelukast (SINGULAIR) 10 MG tablet Take 10 mg by mouth daily.  tm  . rosuvastatin (CRESTOR) 20 MG tablet Take 20 mg by mouth daily.    . tamsulosin (FLOMAX) 0.4 MG CAPS capsule Take 0.4 mg by mouth daily.     No current facility-administered medications for this visit.    Allergies  Allergen Reactions  . Penicillins Rash    Past Medical History:  Diagnosis Date  . Allergy    sesonal  . Complication of anesthesia    HX OF ENLARGED PROSTATE AND UNABLE TO PASS WATER AFTER ANESTHESIA  . Dyspnea    with some exertion   . Dysrhythmia    HX ATRIAL FIBRILLATION - HAD ABLATION - NO LONGER HAS AF.  Marland Kitchen Essential hypertension 12/22/2019  . Essential hypertension, malignant   . Gout, unspecified   . Lower extremity edema 12/22/2019  . Obesity, mild   . OSA (obstructive sleep apnea)    Mild - PT STATES HE DID NOT HAVE TO USE CPAP  . Osteoarthritis   . Osteoarthrosis, unspecified whether generalized or localized, unspecified site   . Other dyspnea and respiratory abnormality   . Prostate enlargement    DR. NESI IS PT'S UROLOGIST  . Pure hypercholesterolemia   . Renal insufficiency, mild    PT STATES NOT AWARE OF ANY KIDNEY PROBLEM    There were no vitals taken for this visit.  No problem-specific Assessment & Plan notes found for this encounter.   Tommy Medal PharmD CPP [facility] [address] [place], [place] [address] [phone]

## 2020-03-19 NOTE — Progress Notes (Signed)
Subjective:  Patient ID: Pedro Lee, male    DOB: 1940-11-30,  MRN: [id number]  Chief Complaint  Patient presents with  . Foot Pain    Medial foot/arch right   "I have been having pain in this right foot for about 3 days really bad"    80 y.o. male presents with the above complaint.  Patient presents with a new complaint of midfoot/medial arch pain.  Patient states it came out of nowhere started hurting really bad about 3 days ago.  He wanted get evaluated.  He does have history of plantar fasciitis and was treated by me.  He does not have any heel pain.  He states it does hurt when he gets up the first thing in the morning and progressively gets a little bit better afterwards.  He denies any other acute complaints.  He has not treated for this yet.   Review of Systems: Negative except as noted in the HPI. Denies N/V/F/Ch.  Past Medical History:  Diagnosis Date  . Allergy    sesonal  . Complication of anesthesia    HX OF ENLARGED PROSTATE AND UNABLE TO PASS WATER AFTER ANESTHESIA  . Dyspnea    with some exertion  . Dysrhythmia    HX ATRIAL FIBRILLATION - HAD ABLATION - NO LONGER HAS AF.  Marland Kitchen Essential hypertension 12/22/2019  . Essential hypertension, malignant   . Gout, unspecified   . Lower extremity edema 12/22/2019  . Obesity, mild   . OSA (obstructive sleep apnea)    Mild - PT STATES HE DID NOT HAVE TO USE CPAP  . Osteoarthritis   . Osteoarthrosis, unspecified whether generalized or localized, unspecified site   . Other dyspnea and respiratory abnormality   . Prostate enlargement    DR. NESI IS PT'S UROLOGIST  . Pure hypercholesterolemia   . Renal insufficiency, mild    PT STATES NOT AWARE OF ANY KIDNEY PROBLEM    Current Outpatient Medications:  .  allopurinol (ZYLOPRIM) 300 MG tablet, Take 300 mg by mouth daily., Disp: , Rfl:  .  aspirin EC 81 MG tablet, Take 81 mg by mouth daily., Disp: , Rfl:  .  carvedilol (COREG) 25 MG tablet, Take 25 mg by mouth 2 (two) times  daily with a meal., Disp: , Rfl:  .  colchicine 0.6 MG tablet, Take 0.6 mg by mouth as needed (gout)., Disp: , Rfl:  .  doxazosin (CARDURA) 4 MG tablet, Take 1 tablet (4 mg total) by mouth 2 (two) times daily., Disp: 180 tablet, Rfl: 3 .  fluticasone (FLONASE) 50 MCG/ACT nasal spray, 2 sprays daily as needed for allergies or rhinitis., Disp: , Rfl:  .  furosemide (LASIX) 40 MG tablet, TAKE 1 TABLET BY MOUTH DAILY AS NEEDED FOR SWELLING, Disp: 90 tablet, Rfl: 0 .  hydrALAZINE (APRESOLINE) 25 MG tablet, Take 1 tablet (25 mg total) by mouth in the morning and at bedtime., Disp: 60 tablet, Rfl: 5 .  montelukast (SINGULAIR) 10 MG tablet, Take 10 mg by mouth daily., Disp: , Rfl: tm .  rosuvastatin (CRESTOR) 20 MG tablet, Take 20 mg by mouth daily., Disp: , Rfl:  .  tamsulosin (FLOMAX) 0.4 MG CAPS capsule, Take 0.4 mg by mouth daily., Disp: , Rfl:   Social History   Tobacco Use  Smoking Status Never Smoker  Smokeless Tobacco Never Used    Allergies  Allergen Reactions  . Penicillins Rash   Objective:  There were no vitals filed for this visit. There is no  height or weight on file to calculate BMI. Constitutional Well developed. Well nourished.  Vascular Dorsalis pedis pulses palpable bilaterally. Posterior tibial pulses palpable bilaterally. Capillary refill normal to all digits.  No cyanosis or clubbing noted. Pedal hair growth normal.  Neurologic Normal speech. Oriented to person, place, and time. Epicritic sensation to light touch grossly present bilaterally.  Dermatologic Nails well groomed and normal in appearance. No open wounds. No skin lesions.  Orthopedic:  Pain on palpation to the right medial midfoot.  Pain with dorsiflexion of the digit.  Plantar fascia noted.  No pain at the calcaneal tuber/heel.  Positive Silfverskiold test noted with gastrocnemius equinus   Radiographs: None Assessment:   1. Plantar fasciitis of right foot   2. Heel spur, right    Plan:  Patient  was evaluated and treated and all questions answered.  Right plantar fasciitis midfoot with underlying heel spur -I explained the patient the etiology of plantar fasciitis adverse treatment options were discussed.  Given that his primary pain is in the arch/midfoot of the plantar fascial band I believe patient will benefit from localized steroid injection into the point of maximal tenderness.  Patient agrees with the plan would like to proceed with a steroid injection.  This will help him decrease acute inflammatory component associated with pain. -A steroid injection was performed at right plantar fasciitis midfoot using 1% plain Lidocaine and 10 mg of Kenalog. This was well tolerated. -He can put his plantar fascial brace on that he is received in the past.   No follow-ups on file.

## 2020-03-20 ENCOUNTER — Other Ambulatory Visit: Payer: Self-pay

## 2020-03-20 ENCOUNTER — Ambulatory Visit: Payer: Medicare PPO | Admitting: Podiatry

## 2020-03-20 DIAGNOSIS — M7731 Calcaneal spur, right foot: Secondary | ICD-10-CM

## 2020-03-20 DIAGNOSIS — M722 Plantar fascial fibromatosis: Secondary | ICD-10-CM

## 2020-03-22 ENCOUNTER — Encounter: Payer: Self-pay | Admitting: Podiatry

## 2020-03-22 NOTE — Progress Notes (Signed)
Subjective:  Patient ID: Pedro Lee, male    DOB: 18-Mar-1940,  MRN: [id number]  Chief Complaint  Patient presents with  . Foot Pain    Right foot PT stated that his right foot is not any better     80 y.o. male presents with the above complaint.  Patient presents with a new complaint of now the heel pain to the right side.  The midfoot part of the medial plantar fascial is doing much better after steroid injection.  He denies any other acute complaints.  He would like to discuss the future treatment options.  He he wants to get ambulating.   Review of Systems: Negative except as noted in the HPI. Denies N/V/F/Ch.  Past Medical History:  Diagnosis Date  . Allergy    sesonal  . Complication of anesthesia    HX OF ENLARGED PROSTATE AND UNABLE TO PASS WATER AFTER ANESTHESIA  . Dyspnea    with some exertion  . Dysrhythmia    HX ATRIAL FIBRILLATION - HAD ABLATION - NO LONGER HAS AF.  Marland Kitchen Essential hypertension 12/22/2019  . Essential hypertension, malignant   . Gout, unspecified   . Lower extremity edema 12/22/2019  . Obesity, mild   . OSA (obstructive sleep apnea)    Mild - PT STATES HE DID NOT HAVE TO USE CPAP  . Osteoarthritis   . Osteoarthrosis, unspecified whether generalized or localized, unspecified site   . Other dyspnea and respiratory abnormality   . Prostate enlargement    DR. NESI IS PT'S UROLOGIST  . Pure hypercholesterolemia   . Renal insufficiency, mild    PT STATES NOT AWARE OF ANY KIDNEY PROBLEM    Current Outpatient Medications:  .  allopurinol (ZYLOPRIM) 300 MG tablet, Take 300 mg by mouth daily., Disp: , Rfl:  .  aspirin EC 81 MG tablet, Take 81 mg by mouth daily., Disp: , Rfl:  .  carvedilol (COREG) 25 MG tablet, Take 25 mg by mouth 2 (two) times daily with a meal., Disp: , Rfl:  .  colchicine 0.6 MG tablet, Take 0.6 mg by mouth as needed (gout)., Disp: , Rfl:  .  doxazosin (CARDURA) 4 MG tablet, Take 1 tablet (4 mg total) by mouth 2 (two) times daily.,  Disp: 180 tablet, Rfl: 3 .  fluticasone (FLONASE) 50 MCG/ACT nasal spray, 2 sprays daily as needed for allergies or rhinitis., Disp: , Rfl:  .  furosemide (LASIX) 40 MG tablet, TAKE 1 TABLET BY MOUTH DAILY AS NEEDED FOR SWELLING, Disp: 90 tablet, Rfl: 0 .  hydrALAZINE (APRESOLINE) 25 MG tablet, Take 1 tablet (25 mg total) by mouth in the morning and at bedtime., Disp: 60 tablet, Rfl: 5 .  montelukast (SINGULAIR) 10 MG tablet, Take 10 mg by mouth daily., Disp: , Rfl: tm .  rosuvastatin (CRESTOR) 20 MG tablet, Take 20 mg by mouth daily., Disp: , Rfl:  .  tamsulosin (FLOMAX) 0.4 MG CAPS capsule, Take 0.4 mg by mouth daily., Disp: , Rfl:   Social History   Tobacco Use  Smoking Status Never Smoker  Smokeless Tobacco Never Used    Allergies  Allergen Reactions  . Penicillins Rash   Objective:  There were no vitals filed for this visit. There is no height or weight on file to calculate BMI. Constitutional Well developed. Well nourished.  Vascular Dorsalis pedis pulses palpable bilaterally. Posterior tibial pulses palpable bilaterally. Capillary refill normal to all digits.  No cyanosis or clubbing noted. Pedal hair growth normal.  Neurologic  Normal speech. Oriented to person, place, and time. Epicritic sensation to light touch grossly present bilaterally.  Dermatologic Nails well groomed and normal in appearance. No open wounds. No skin lesions.  Orthopedic:  Pain on palpation to the right calcaneal tuber.  No further pain at the midfoot per the plantar fascial.  Pain with dorsiflexion of the digit.  Plantar fascia noted.   Positive Silfverskiold test noted with gastrocnemius equinus   Radiographs: None Assessment:   1. Plantar fasciitis of right foot   2. Heel spur, right    Plan:  Patient was evaluated and treated and all questions answered.  Right plantar fasciitis now more hindfoot with underlying heel spur -I explained the patient the etiology of plantar fasciitis adverse  treatment options were discussed.  Given that his primary pain is in the arch/midfoot of the plantar fascial band I believe patient will benefit from localized steroid injection into the point of maximal tenderness.  Patient agrees with the plan would like to proceed with a steroid injection.  This will help him decrease acute inflammatory component associated with pain. -A second steroid injection was performed at right plantar fasciitis midfoot using 1% plain Lidocaine and 10 mg of Kenalog. This was well tolerated. -Continue wearing plantar fascial brace   No follow-ups on file.

## 2020-03-29 ENCOUNTER — Other Ambulatory Visit: Payer: Self-pay

## 2020-03-29 ENCOUNTER — Ambulatory Visit (INDEPENDENT_AMBULATORY_CARE_PROVIDER_SITE_OTHER): Payer: Medicare PPO | Admitting: Pharmacist

## 2020-03-29 VITALS — BP 164/62 | HR 62 | Ht 77.0 in | Wt 317.0 lb

## 2020-03-29 DIAGNOSIS — I10 Essential (primary) hypertension: Secondary | ICD-10-CM

## 2020-03-29 MED ORDER — DOXAZOSIN MESYLATE 4 MG PO TABS: 4.0000 mg | ORAL_TABLET | Freq: Two times a day (BID) | ORAL | 0 refills | Status: DC

## 2020-03-29 NOTE — Patient Instructions (Addendum)
Return for a  follow up appointment in 4-5 weeks with Dr Oval Linsey  Check your blood pressure at home daily (if able) and keep record of the readings.  Take your BP meds as follows: *TAKE furosemide 40mg  daily for 3 days, then resume to take as needed* *Carvedilol 25mg  twice daily - stop taking carvedilol 12.5mg  *Discontinue use of doxazosin 2mg  tablets, take doxazosin 4mg  twice daily*  Bring all of your meds, your BP cuff and your record of home blood pressures to your next appointment.  Exercise as you're able, try to walk approximately 30 minutes per day.  Keep salt intake to a minimum, especially watch canned and prepared boxed foods.  Eat more fresh fruits and vegetables and fewer canned items.  Avoid eating in fast food restaurants.    HOW TO TAKE YOUR BLOOD PRESSURE: . Rest 5 minutes before taking your blood pressure. .  Don't smoke or drink caffeinated beverages for at least 30 minutes before. . Take your blood pressure before (not after) you eat. . Sit comfortably with your back supported and both feet on the floor (don't cross your legs). . Elevate your arm to heart level on a table or a desk. . Use the proper sized cuff. It should fit smoothly and snugly around your bare upper arm. There should be enough room to slip a fingertip under the cuff. The bottom edge of the cuff should be 1 inch above the crease of the elbow. . Ideally, take 3 measurements at one sitting and record the average.

## 2020-03-29 NOTE — Progress Notes (Signed)
HPI:  Pedro Lee is a 80 y.o. male patient of Dr Oval Linsey,  presents today for advanced hypertension clinic follow up. During last OV carvedilol dose was increased to 25mg  twice daily, and hydralazine was added to his regimen.   Today patient returns for follow up and reports increased swelling. Noted he still slightly confuse with medication doses and is not using his furosemide.  Blood Pressure Goal:  130/80  Current Medications:  benazepril 40mg  daily (Dec/16/21) - taking? doxazosin 2 mg twice daily (January-2-22) carvedilol 25 mg twice daily(03-24-20) Hydralazine 25mg  twice daily(02-20-20) furosemide 40 mg as needed January 03, 2020)  Family Hx: father died 75, heart disease, mother 82 old age; brother now 45, sister 8 both living without heat issues; one son, no known health issues  Social Hx: no, no, coffee 1-1.5 cups per day home brewed  Diet: mostly home cooked, some salt with cooking, none at table; mix of f/v; rarely deep fried  Exercise: [employer]; 2 day/week program; more resistance, will do some cardiovascular starting Feb  Home BP readings: home cuff Omron, noted to be accurate in previous OV 13 readings, average 152/87, HR range 58-67bpm  Intolerances: penicillin; avoid amlodipine d/t chronic lower extremity edema  Labs: 8/21: Na 145, K 4.8, Glu 94, BUN 16, Scr 1.41 GFR 55  Wt Readings from Last 3 Encounters:  03/29/20 (!) 317 lb (143.8 kg)  02/20/20 (!) 311 lb (141.1 kg)  01/22/20 (!) 314 lb (142.4 kg)   BP Readings from Last 3 Encounters:  03/29/20 (!) 164/62  02/20/20 (!) 142/76  01/22/20 136/72   Pulse Readings from Last 3 Encounters:  03/29/20 62  02/20/20 68  01/22/20 (!) 46    Current Outpatient Medications  Medication Sig Dispense Refill  . allopurinol (ZYLOPRIM) 300 MG tablet Take 300 mg by mouth daily.    Marland Kitchen aspirin EC 81 MG tablet Take 81 mg by mouth daily.    . benazepril (LOTENSIN) 40 MG tablet Take 40 mg by mouth daily.    . carvedilol  (COREG) 25 MG tablet Take 25 mg by mouth 2 (two) times daily with a meal.    . fluticasone (FLONASE) 50 MCG/ACT nasal spray 2 sprays daily as needed for allergies or rhinitis.    . hydrALAZINE (APRESOLINE) 25 MG tablet Take 1 tablet (25 mg total) by mouth in the morning and at bedtime. 60 tablet 5  . montelukast (SINGULAIR) 10 MG tablet Take 10 mg by mouth daily.  tm  . nitrofurantoin (MACRODANTIN) 100 MG capsule Take 100 mg by mouth 2 (two) times daily.    . rosuvastatin (CRESTOR) 20 MG tablet Take 20 mg by mouth daily.    . tamsulosin (FLOMAX) 0.4 MG CAPS capsule Take 0.4 mg by mouth daily.    Marland Kitchen doxazosin (CARDURA) 4 MG tablet Take 1 tablet (4 mg total) by mouth 2 (two) times daily. 180 tablet 0  . furosemide (LASIX) 40 MG tablet TAKE 1 TABLET BY MOUTH DAILY AS NEEDED FOR SWELLING (Patient not taking: Reported on 03/29/2020) 90 tablet 0   No current facility-administered medications for this visit.    Allergies  Allergen Reactions  . Penicillins Rash    Past Medical History:  Diagnosis Date  . Allergy    sesonal  . Complication of anesthesia    HX OF ENLARGED PROSTATE AND UNABLE TO PASS WATER AFTER ANESTHESIA  . Dyspnea    with some exertion  . Dysrhythmia    HX ATRIAL FIBRILLATION - HAD ABLATION -  NO LONGER HAS AF.  Marland Kitchen Essential hypertension 12/22/2019  . Essential hypertension, malignant   . Gout, unspecified   . Lower extremity edema 12/22/2019  . Obesity, mild   . OSA (obstructive sleep apnea)    Mild - PT STATES HE DID NOT HAVE TO USE CPAP  . Osteoarthritis   . Osteoarthrosis, unspecified whether generalized or localized, unspecified site   . Other dyspnea and respiratory abnormality   . Prostate enlargement    DR. NESI IS PT'S UROLOGIST  . Pure hypercholesterolemia   . Renal insufficiency, mild    PT STATES NOT AWARE OF ANY KIDNEY PROBLEM    Blood pressure (!) 164/62, pulse 62, height 6\' 5"  (1.956 m), weight (!) 317 lb (143.8 kg).  Essential hypertension Blood  pressure highly variable and patient remains slightly confuse with all recent medication changes. Noted increased swelling and SOB, but he reports no furosemide dose in at least 30 days.  Patient was instructed to take furosemide 40mg  daily for 3 days, then resume to take only as needed for fluid retention. Change doxazosin to 4mg  tablets and take 4mg  twice daily. STOP using carvedilol 12.5mg  and take 25mg  twice daily.   Will schedule follow up visit with dr Oval Linsey in 4-5 weeks and continue medication titration as needed.     [name] PharmD, BCPS, [place] [id number] 04/02/2020 4:25 PM

## 2020-04-02 ENCOUNTER — Encounter: Payer: Self-pay | Admitting: Pharmacist

## 2020-04-02 NOTE — Assessment & Plan Note (Signed)
Blood pressure highly variable and patient remains slightly confuse with all recent medication changes. Noted increased swelling and SOB, but he reports no furosemide dose in at least 30 days.  Patient was instructed to take furosemide 40mg  daily for 3 days, then resume to take only as needed for fluid retention. Change doxazosin to 4mg  tablets and take 4mg  twice daily. STOP using carvedilol 12.5mg  and take 25mg  twice daily.   Will schedule follow up visit with dr Oval Linsey in 4-5 weeks and continue medication titration as needed.

## 2020-04-10 ENCOUNTER — Other Ambulatory Visit (HOSPITAL_BASED_OUTPATIENT_CLINIC_OR_DEPARTMENT_OTHER): Payer: Self-pay

## 2020-04-10 DIAGNOSIS — R0683 Snoring: Secondary | ICD-10-CM

## 2020-04-10 DIAGNOSIS — G4734 Idiopathic sleep related nonobstructive alveolar hypoventilation: Secondary | ICD-10-CM

## 2020-04-10 DIAGNOSIS — R5383 Other fatigue: Secondary | ICD-10-CM

## 2020-04-18 ENCOUNTER — Ambulatory Visit (HOSPITAL_BASED_OUTPATIENT_CLINIC_OR_DEPARTMENT_OTHER): Payer: Medicare PPO | Attending: Internal Medicine | Admitting: Internal Medicine

## 2020-04-18 ENCOUNTER — Other Ambulatory Visit: Payer: Self-pay

## 2020-04-18 VITALS — Ht 77.0 in | Wt 300.0 lb

## 2020-04-18 DIAGNOSIS — I493 Ventricular premature depolarization: Secondary | ICD-10-CM | POA: Insufficient documentation

## 2020-04-18 DIAGNOSIS — G4733 Obstructive sleep apnea (adult) (pediatric): Secondary | ICD-10-CM | POA: Insufficient documentation

## 2020-04-18 DIAGNOSIS — G4734 Idiopathic sleep related nonobstructive alveolar hypoventilation: Secondary | ICD-10-CM

## 2020-04-18 DIAGNOSIS — R5383 Other fatigue: Secondary | ICD-10-CM

## 2020-04-18 DIAGNOSIS — R0683 Snoring: Secondary | ICD-10-CM

## 2020-04-19 ENCOUNTER — Other Ambulatory Visit: Payer: Self-pay | Admitting: Cardiovascular Disease

## 2020-04-19 ENCOUNTER — Ambulatory Visit: Payer: Medicare PPO | Admitting: Podiatry

## 2020-04-19 DIAGNOSIS — M778 Other enthesopathies, not elsewhere classified: Secondary | ICD-10-CM

## 2020-04-19 DIAGNOSIS — M19079 Primary osteoarthritis, unspecified ankle and foot: Secondary | ICD-10-CM | POA: Diagnosis not present

## 2020-04-23 ENCOUNTER — Encounter: Payer: Self-pay | Admitting: Podiatry

## 2020-04-23 NOTE — Progress Notes (Signed)
Subjective:  Patient ID: Pedro Lee, male    DOB: 12-Jul-1940,  MRN: [id number]  Chief Complaint  Patient presents with  . Plantar Fasciitis    PT stated that he is doing okay he stated that he does have some swelling in his foot he does believe that he is having a gout flare up    80 y.o. male presents with the above complaint.  Patient presents with complaint of bilateral dorsal midfoot pain.  Patient states it has been swelling up is been very painful to the area.  He states his plantar fasciitis is doing much better.  That has completely resolved with injection and is well controlled.  No acute complaints for the plantar fasciitis.  However the midfoot arthritis she thinks that it may have flared back up again.  He is already taking colchicine for his gout to help with the flare which does help a little bit.  He would like to know if he could discuss injection to that area.  He denies any other acute complaints.   Review of Systems: Negative except as noted in the HPI. Denies N/V/F/Ch.  Past Medical History:  Diagnosis Date  . Allergy    sesonal  . Complication of anesthesia    HX OF ENLARGED PROSTATE AND UNABLE TO PASS WATER AFTER ANESTHESIA  . Dyspnea    with some exertion  . Dysrhythmia    HX ATRIAL FIBRILLATION - HAD ABLATION - NO LONGER HAS AF.  Marland Kitchen Essential hypertension 12/22/2019  . Essential hypertension, malignant   . Gout, unspecified   . Lower extremity edema 12/22/2019  . Obesity, mild   . OSA (obstructive sleep apnea)    Mild - PT STATES HE DID NOT HAVE TO USE CPAP  . Osteoarthritis   . Osteoarthrosis, unspecified whether generalized or localized, unspecified site   . Other dyspnea and respiratory abnormality   . Prostate enlargement    DR. NESI IS PT'S UROLOGIST  . Pure hypercholesterolemia   . Renal insufficiency, mild    PT STATES NOT AWARE OF ANY KIDNEY PROBLEM    Current Outpatient Medications:  .  allopurinol (ZYLOPRIM) 300 MG tablet, Take 300 mg by  mouth daily., Disp: , Rfl:  .  aspirin EC 81 MG tablet, Take 81 mg by mouth daily., Disp: , Rfl:  .  benazepril (LOTENSIN) 40 MG tablet, Take 40 mg by mouth daily., Disp: , Rfl:  .  carvedilol (COREG) 25 MG tablet, Take 25 mg by mouth 2 (two) times daily with a meal., Disp: , Rfl:  .  doxazosin (CARDURA) 4 MG tablet, Take 1 tablet (4 mg total) by mouth 2 (two) times daily., Disp: 180 tablet, Rfl: 0 .  fluticasone (FLONASE) 50 MCG/ACT nasal spray, 2 sprays daily as needed for allergies or rhinitis., Disp: , Rfl:  .  furosemide (LASIX) 40 MG tablet, TAKE 1 TABLET BY MOUTH DAILY AS NEEDED FOR SWELLING, Disp: 90 tablet, Rfl: 3 .  hydrALAZINE (APRESOLINE) 25 MG tablet, Take 1 tablet (25 mg total) by mouth in the morning and at bedtime., Disp: 60 tablet, Rfl: 5 .  montelukast (SINGULAIR) 10 MG tablet, Take 10 mg by mouth daily., Disp: , Rfl: tm .  nitrofurantoin (MACRODANTIN) 100 MG capsule, Take 100 mg by mouth 2 (two) times daily., Disp: , Rfl:  .  rosuvastatin (CRESTOR) 20 MG tablet, Take 20 mg by mouth daily., Disp: , Rfl:  .  tamsulosin (FLOMAX) 0.4 MG CAPS capsule, Take 0.4 mg by mouth daily., Disp: ,  Rfl:   Social History   Tobacco Use  Smoking Status Never Smoker  Smokeless Tobacco Never Used    Allergies  Allergen Reactions  . Penicillins Rash   Objective:  There were no vitals filed for this visit. There is no height or weight on file to calculate BMI. Constitutional Well developed. Well nourished.  Vascular Dorsalis pedis pulses palpable bilaterally. Posterior tibial pulses palpable bilaterally. Capillary refill normal to all digits.  No cyanosis or clubbing noted. Pedal hair growth normal.  Neurologic Normal speech. Oriented to person, place, and time. Epicritic sensation to light touch grossly present bilaterally.  Dermatologic Nails well groomed and normal in appearance. No open wounds. No skin lesions.  Orthopedic:  Pain on palpation to the dorsal midfoot bilaterally.   No pain with range of motion of the extensor or flexor tendinitis.  No pain at the Lisfranc interval and he does appear to be intact and maintained.   Radiographs: None Assessment:   1. Arthritis of midfoot   2. Capsulitis of right foot   3. Capsulitis of left foot    Plan:  Patient was evaluated and treated and all questions answered.  Bilateral dorsal midfoot arthritis/capsulitis -I explained the patient the etiology of midfoot arthritis and capsulitis and various treatment options were extensively discussed.  Given the amount of pain that is having that is not being resolved with colchicine I believe patient would benefit from a local steroid injection to help decrease acute inflammatory component associated pain.  Patient agrees with the plan like to proceed with a steroid injection -A steroid injection was performed at Bilateral dorsal midfoot at point of maximal tenderness using 1% plain Lidocaine and 10 mg of Kenalog. This was well tolerated.  Right plantar fasciitis with underlying heel spur -Clinically resolved   No follow-ups on file.

## 2020-04-27 DIAGNOSIS — R0683 Snoring: Secondary | ICD-10-CM

## 2020-04-27 NOTE — Procedures (Signed)
   Patient Name: Pedro Lee, Pedro Lee Date: 04/18/2020 Gender: Male D.O.B: Jul 28, 1940 Age (years): 68 Referring Provider: Kevan Ny Height (inches): 15 Interpreting Physician: Baird Lyons MD, ABSM Weight (lbs): 300 RPSGT: Baxter Flattery BMI: 37 MRN: [id number] Neck Size: 19.00  CLINICAL INFORMATION Sleep Study Type: NPSG Indication for sleep study: Obesity, Snoring, Witnesses Apnea / Gasping During Sleep Epworth Sleepiness Score: 8  SLEEP STUDY TECHNIQUE As per the AASM Manual for the Scoring of Sleep and Associated Events v2.3 (April 2016) with a hypopnea requiring 4% desaturations.  The channels recorded and monitored were frontal, central and occipital EEG, electrooculogram (EOG), submentalis EMG (chin), nasal and oral airflow, thoracic and abdominal wall motion, anterior tibialis EMG, snore microphone, electrocardiogram, and pulse oximetry.  MEDICATIONS Medications self-administered by patient taken the night of the study : none reported  SLEEP ARCHITECTURE The study was initiated at 10:08:29 PM and ended at 4:52:45 AM.  Sleep onset time was 14.2 minutes and the sleep efficiency was 77.1%%. The total sleep time was 311.5 minutes.  Stage REM latency was 161.0 minutes.  The patient spent 5.0%% of the night in stage N1 sleep, 77.0%% in stage N2 sleep, 0.0%% in stage N3 and 18% in REM.  Alpha intrusion was absent.  Supine sleep was 50.40%.  RESPIRATORY PARAMETERS The overall apnea/hypopnea index (AHI) was 19.3 per hour. There were 45 total apneas, including 45 obstructive, 0 central and 0 mixed apneas. There were 55 hypopneas and 46 RERAs.  The AHI during Stage REM sleep was 58.9 per hour.  AHI while supine was 31.0 per hour.  The mean oxygen saturation was 93.8%. The minimum SpO2 during sleep was 80.0%.  loud snoring was noted during this study.  CARDIAC DATA The 2 lead EKG demonstrated sinus rhythm. The mean heart rate was 58.2 beats per minute. Other EKG findings  include: PVCs.  LEG MOVEMENT DATA The total PLMS were 0 with a resulting PLMS index of 0.0. Associated arousal with leg movement index was 0.0 .  IMPRESSIONS - Moderate obstructive sleep apnea occurred during this study (AHI = 19.3/h). - Insufficient early events to meet protocol requirement for split CPAP titration. - Moderate oxygen desaturation was noted during this study (Min O2 = 80.0%).  Mean O2 saturation 93.8% - The patient snored with loud snoring volume. - EKG findings include occasional PVCs. - Clinically significant periodic limb movements did not occur during sleep.   DIAGNOSIS - Obstructive Sleep Apnea (G47.33)  RECOMMENDATIONS - Suggest CPAP titration sleep study or autopap. Other options would be based on clinical judgment. - Be careful with alcohol, sedatives and other CNS depressants that may worsen sleep apnea and disrupt normal sleep architecture. - Sleep hygiene should be reviewed to assess factors that may improve sleep quality. - Weight management and regular exercise should be initiated or continued if appropriate.  [Electronically signed] 04/27/2020 10:15 AM  Baird Lyons MD, ABSM Diplomate, American Board of Sleep Medicine   NPI: [id number]                        [place]  ELECTRONICALLY SIGNED ON:  04/27/2020, 10:10 AM [place] PH: [phone]   FX: [phone] [place]

## 2020-05-05 NOTE — Progress Notes (Signed)
Hypertension Clinic Initial Assessment:    Date:  05/06/2020   ID:  Pedro Lee, DOB 01/11/1941, MRN [id number]  PCP:  Pedro Blocker, MD  Cardiologist:  Pedro Grooms, MD   Referring MD: Pedro Blocker, MD   CC: Hypertension  History of Present Illness:    Pedro Lee is a 80 y.o. male with a hx of obesity, hypertension, OSA not yet on CPAP, AVNRT s/p ablation and pituitary tumor here to establish care in the hypertension clinic.  He saw Dr. Tamala Lee in 2019 for exertional dyspnea.  His symptoms were thought to be due to deconditioning, hypertension, and obesity.  He subsequently followed up with Pedro Husk, PA-C, on 10/2019.  At that time his blood pressure was running high.  Prior to that he had seen his PCP and doxazosin was reduced due to low blood pressure and exertional dyspnea.  He had a [employer] 10/2019 that was negative for ischemia.  Systolic function was normal.  He saw Pedro Lee back 11/2019 and his doxazosin was increased to 2 mg twice daily.  He had a virtual follow-up 11/2019 and his blood pressure was running high in the evenings.  He was working out and limiting his sodium intake.  They decided to increase his doxazosin because he was also struggling with his prostate.  He was referred to advanced hypertension clinic for further management.  Lately his BP has been ranging 130-170s/70-90s.  He has been mostly checking it in the mornings.  He got a new machine about 2 months ago.  Pedro Lee had his pituitary tumor removed about 5 years ago.  He does not think he follows up with endocrinology.  He lost his wife of 78 years in May 2021 and has struggled since that time.  Thyroid function and cortisol are within normal limits.  At his initial visit he reported leg discomfort.  He had ABIs 01/2020 that were within normal limits.  His symptoms were thought to be mostly due to venous insufficiency and he was given furosemide to take as needed.  He saw our PharmD 02/2020  and hydralazine was added to his regimen.  One 03/2020 he had increased LE edema and was not using furosemide.  He was confused about his medications and carvedilol and doxazosin were increased.  Since his last appointment his blood pressure has been fluctuating at home.  It has been between 130s and 160s over 90s to 100s before he takes his medicine in the morning.  He takes his meds anywhere from 5 to 9 AM.  He had 1 day of low blood pressure in the 90s and this made him feel lightheaded.  He has not been getting much exercise lately.  [place] 1 day.  He had no chest pain but got very tired and thought he might have to call in Pedro Lee to get home.  He denies any exertional chest pain.  He does have shortness of breath with walking.  His edema has been stable and he denies orthopnea or PND.  He was recently diagnosed with sleep apnea and is awaiting a CPAP titration.  He has been trying to cook at home and limiting his sodium intake.   Past Medical History:  Diagnosis Date  . Allergy    sesonal  . Complication of anesthesia    HX OF ENLARGED PROSTATE AND UNABLE TO PASS WATER AFTER ANESTHESIA  . Dyspnea    with  some exertion  . Dysrhythmia    HX ATRIAL FIBRILLATION - HAD ABLATION - NO LONGER HAS AF.  Marland Kitchen Essential hypertension 12/22/2019  . Essential hypertension, malignant   . Gout, unspecified   . Lower extremity edema 12/22/2019  . Obesity, mild   . OSA (obstructive sleep apnea)    Mild - PT STATES HE DID NOT HAVE TO USE CPAP  . Osteoarthritis   . Osteoarthrosis, unspecified whether generalized or localized, unspecified site   . Other dyspnea and respiratory abnormality   . Prostate enlargement    DR. NESI IS PT'S UROLOGIST  . Pure hypercholesterolemia   . Renal insufficiency, mild    PT STATES NOT AWARE OF ANY KIDNEY PROBLEM    Past Surgical History:  Procedure Laterality Date  . COLONOSCOPY    . CRANIOTOMY N/A 03/24/2016   Procedure: Transphenoidal  Resection of Tumor;  Surgeon: Earnie Larsson, MD;  Location: [facility];  Service: Neurosurgery;  Laterality: N/A;  Transphenoidal Resection of Tumor  . HEART ABLATION FOR AF  1998  . JOINT REPLACEMENT     LEFT TOTAL KNEE REPLACEMENT  . KNEE ARTHROPLASTY Left 2013ish  . LEFT KNEE ARTHROSCOPY    . TOTAL KNEE ARTHROPLASTY Right 05/30/2013   Procedure: RIGHT TOTAL KNEE ARTHROPLASTY;  Surgeon: Mauri Pole, MD;  Location: [facility];  Service: Orthopedics;  Laterality: Right;  . TRANSNASAL APPROACH N/A 03/24/2016   Procedure: TRANSNASAL APPROACH;  Surgeon: Rozetta Nunnery, MD;  Location[facility];  Service: ENT;  Laterality: N/A;  TRANSNASAL APPROACH    Current Medications: Current Meds  Medication Sig  . allopurinol (ZYLOPRIM) 300 MG tablet Take 300 mg by mouth daily.  Marland Kitchen amLODipine (NORVASC) 5 MG tablet Take 1 tablet (5 mg total) by mouth at bedtime.  Marland Kitchen aspirin EC 81 MG tablet Take 81 mg by mouth daily.  . benazepril (LOTENSIN) 40 MG tablet Take 40 mg by mouth daily.  . carvedilol (COREG) 25 MG tablet Take 25 mg by mouth 2 (two) times daily with a meal.  . doxazosin (CARDURA) 4 MG tablet Take 1 tablet (4 mg total) by mouth 2 (two) times daily.  . fluticasone (FLONASE) 50 MCG/ACT nasal spray 2 sprays daily as needed for allergies or rhinitis.  . furosemide (LASIX) 40 MG tablet TAKE 1 TABLET BY MOUTH DAILY AS NEEDED FOR SWELLING  . montelukast (SINGULAIR) 10 MG tablet Take 10 mg by mouth daily.  . rosuvastatin (CRESTOR) 20 MG tablet Take 20 mg by mouth daily.  . tamsulosin (FLOMAX) 0.4 MG CAPS capsule Take 0.4 mg by mouth daily.  . [DISCONTINUED] hydrALAZINE (APRESOLINE) 25 MG tablet Take 1 tablet (25 mg total) by mouth in the morning and at bedtime.     Allergies:   Penicillins   Social History   Socioeconomic History  . Marital status: Married    Spouse name: Not on file  . Number of children: Not on file  . Years of education: Not on file  . Highest education level: Not on file   Occupational History  . Not on file  Tobacco Use  . Smoking status: Never Smoker  . Smokeless tobacco: Never Used  Vaping Use  . Vaping Use: Never used  Substance and Sexual Activity  . Alcohol use: No    Alcohol/week: 0.0 standard drinks  . Drug use: No  . Sexual activity: Not on file  Other Topics Concern  . Not on file  Social History Narrative  . Not on file   Social Determinants  of Health   Financial Resource Strain: Low Risk   . Difficulty of Paying Living Expenses: Not hard at all  Food Insecurity: No Food Insecurity  . Worried About Charity fundraiser in the Last Year: Never true  . Ran Out of Food in the Last Year: Never true  Transportation Needs: No Transportation Needs  . Lack of Transportation (Medical): No  . Lack of Transportation (Non-Medical): No  Physical Activity: Inactive  . Days of Exercise per Week: 0 days  . Minutes of Exercise per Session: 0 min  Stress: No Stress Concern Present  . Feeling of Stress : Only a little  Social Connections: Not on file     Family History: The patient's family history includes Heart disease (age of onset: 30) in his father.  ROS:   Please see the history of present illness.    All other systems reviewed and are negative.  EKGs/Labs/Other Studies Reviewed:    EKG:  EKG is not ordered today.    Recent Labs: 12/22/2019: TSH 2.070   Recent Lipid Panel No results found for: CHOL, TRIG, HDL, CHOLHDL, VLDL, LDLCALC, LDLDIRECT  Physical Exam:    VS:  BP 122/68 (BP Location: Left Arm, Patient Position: Sitting, Cuff Size: Large)   Pulse 60   Ht 6\' 5"  (1.956 m)   Wt (!) 315 lb (142.9 kg)   SpO2 96%   BMI 37.35 kg/m  , BMI Body mass index is 37.35 kg/m. GENERAL:  Well appearing HEENT: Pupils equal round and reactive, fundi not visualized, oral mucosa unremarkable NECK:  No jugular venous distention, waveform within normal limits, carotid upstroke brisk and symmetric, no bruits LUNGS:  Clear to auscultation  bilaterally HEART:  RRR.  PMI not displaced or sustained,S1 and S2 within normal limits, no S3, no S4, no clicks, no rubs, no murmurs ABD:  Flat, positive bowel sounds normal in frequency in pitch, no bruits, no rebound, no guarding, no midline pulsatile mass, no hepatomegaly, no splenomegaly EXT:  2 plus right DP pulse.  Unable to palpate left DP/TP or right TP.  Suspect edema is contributing.  2+ LE edema to the mid tibia bilaterally, no cyanosis no clubbing SKIN:  No rashes no nodules NEURO:  Cranial nerves II through XII grossly intact, motor grossly intact throughout PSYCH:  Cognitively intact, oriented to person place and time  ASSESSMENT:    1. Essential hypertension   2. Pituitary macroadenoma with extrasellar extension 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  His blood pressure seems to be better in the office than at home lately.  It seems to be somewhat labile.  We did discuss taking his medications at the same time every day.  Continue benazepril, carvedilol, doxazosin and stop hydralazine.  We will start amlodipine 5 mg nightly.  Continue tracking blood pressures at home.  He is taking furosemide only as needed.  Continue follow up for CPAP.  Secondary Causes of Hypertension  Medications/Herbal: OCP, steroids, stimulants, antidepressants, weight loss medication, immune suppressants, NSAIDs, sympathomimetics, alcohol, caffeine, licorice, ginseng, St. John's wort, chemo  Sleep Apnea: Awaiting CPAP titration Renal artery stenosis: mild L RAS 01/2020 hyperaldosteronism: Testing deferred.  No hypokalemia  Hyper/hypothyroidism: TSH normal 12/2019 Pheochromocytoma: testing not indicated Cushing's syndrome: Cortisol normal 12/2019 Coarctation of the aorta: Blood pressure symmetric  # Renal artery stenosis:  Repeat renal Dopplers 01/2021.  # LE edema: # Abnormal ABIs: Suspect his symptoms  are  mostly attributable to venous stasis.  Continue lower extremity compression socks.  He has no heart failure symptoms.  Continue furosemide as needed.  ABIs normal 01/2020   Disposition:    FU with MD/PharmD in 1 month    Medication Adjustments/Labs and Tests Ordered: Current medicines are reviewed at length with the patient today.  Concerns regarding medicines are outlined above.  No orders of the defined types were placed in this encounter.  Meds ordered this encounter  Medications  . amLODipine (NORVASC) 5 MG tablet    Sig: Take 1 tablet (5 mg total) by mouth at bedtime.    Dispense:  90 tablet    Refill:  3    D/C HYDRALAZINE     Signed, Skeet Latch, MD  05/06/2020 1:26 PM    [facility]

## 2020-05-06 ENCOUNTER — Encounter: Payer: Self-pay | Admitting: Cardiovascular Disease

## 2020-05-06 ENCOUNTER — Ambulatory Visit: Payer: Medicare PPO | Admitting: Cardiovascular Disease

## 2020-05-06 ENCOUNTER — Other Ambulatory Visit: Payer: Self-pay

## 2020-05-06 VITALS — BP 122/68 | HR 60 | Ht 77.0 in | Wt 315.0 lb

## 2020-05-06 DIAGNOSIS — D352 Benign neoplasm of pituitary gland: Secondary | ICD-10-CM | POA: Diagnosis not present

## 2020-05-06 DIAGNOSIS — I10 Essential (primary) hypertension: Secondary | ICD-10-CM | POA: Diagnosis not present

## 2020-05-06 MED ORDER — AMLODIPINE BESYLATE 5 MG PO TABS: 5.0000 mg | ORAL_TABLET | Freq: Every day | ORAL | 3 refills | Status: DC

## 2020-05-06 NOTE — Patient Instructions (Addendum)
Medication Instructions:  STOP HYDRALAZINE  START AMLODIPINE 5 MG AT BEDTIME   Labwork: NONE   Testing/Procedures: NONE  Follow-Up: 06/28/2020 AT 10:30 AM WITH PHARM D

## 2020-06-10 ENCOUNTER — Telehealth: Payer: Self-pay

## 2020-06-10 NOTE — Telephone Encounter (Signed)
lmom pt to complete labs kristin alvstad had previously ordered

## 2020-06-28 ENCOUNTER — Ambulatory Visit: Payer: Medicare PPO

## 2020-07-04 ENCOUNTER — Other Ambulatory Visit: Payer: Self-pay

## 2020-07-04 ENCOUNTER — Ambulatory Visit (INDEPENDENT_AMBULATORY_CARE_PROVIDER_SITE_OTHER): Payer: Medicare PPO | Admitting: Pharmacist Clinician (PhC)/ Clinical Pharmacy Specialist

## 2020-07-04 DIAGNOSIS — I10 Essential (primary) hypertension: Secondary | ICD-10-CM

## 2020-07-04 MED ORDER — HYDRALAZINE HCL 50 MG PO TABS: 50.0000 mg | ORAL_TABLET | Freq: Two times a day (BID) | ORAL | 3 refills | Status: DC

## 2020-07-04 NOTE — Patient Instructions (Signed)
Return for a a follow up appointment June 28 at 9 am  Check your blood pressure at home 3-4 days each week and keep record of the readings.  Take your BP meds as follows:  Stop amlodipine  Increase hydralazine to 50 mg twice daily (take 2 of the 25 mg tabs twice daily until gone).   Continue with all other medications  Bring all of your meds, your BP cuff and your record of home blood pressures to your next appointment.  Exercise as you're able, try to walk approximately 30 minutes per day.  Keep salt intake to a minimum, especially watch canned and prepared boxed foods.  Eat more fresh fruits and vegetables and fewer canned items.  Avoid eating in fast food restaurants.    HOW TO TAKE YOUR BLOOD PRESSURE: . Rest 5 minutes before taking your blood pressure. .  Don't smoke or drink caffeinated beverages for at least 30 minutes before. . Take your blood pressure before (not after) you eat. . Sit comfortably with your back supported and both feet on the floor (don't cross your legs). . Elevate your arm to heart level on a table or a desk. . Use the proper sized cuff. It should fit smoothly and snugly around your bare upper arm. There should be enough room to slip a fingertip under the cuff. The bottom edge of the cuff should be 1 inch above the crease of the elbow. . Ideally, take 3 measurements at one sitting and record the average.

## 2020-07-04 NOTE — Progress Notes (Signed)
HPI:  Pedro Lee is a 80 y.o. male patient of Dr Oval Linsey,  presents today for advanced hypertension clinic follow up.  He has been seen multiple times by both Dr. Oval Linsey and the [facility] team.  Earlier this year carvedilol dose was increased to 25mg  twice daily, doxazosin was increased to 4 mg bid and hydralazine was added to his regimen. Most recently he saw Dr. Oval Linsey in March, at which time his pressure was at goal (122/68), however home readings were much more labile.  Amlodipine 5 mg was added.    Today patient returns for follow up. Unfortunately he has developed lower extremity edema again after starting the amlodipine (he had this problem in the past).  Did not bring any home BP readings with him today, but from memory notes that the range was roughly 027-741 systolic and 28-786 diastolic.    Blood Pressure Goal:  130/80  Current Medications:  benazepril 20 mg bid Amlodipine 5 mg qd - pm doxazosin 4 mg twice daily  carvedilol 25 mg twice daily Hydralazine 25mg  twice daily furosemide 40 mg as needed   Family Hx: father died 32, heart disease, mother 37 old age; brother now 20, sister 27 both living without heat issues; one son, no known health issues  Social Hx: no, no, coffee 1-1.5 cups per day home brewed  Diet: mostly home cooked, some salt with cooking, none at table; mix of f/v; rarely deep fried  Exercise: nothing recently  Home BP readings: home cuff Omron, noted to be accurate in previous OV 13 readings, average 152/87, HR range 58-67bpm  Intolerances: penicillin; avoid amlodipine d/t chronic lower extremity edema  Labs: 8/21: Na 145, K 4.8, Glu 94, BUN 16, Scr 1.41 GFR 55  Wt Readings from Last 3 Encounters:  07/04/20 (!) 315 lb (142.9 kg)  05/06/20 (!) 315 lb (142.9 kg)  04/18/20 300 lb (136.1 kg)   BP Readings from Last 3 Encounters:  07/04/20 132/76  05/06/20 122/68  03/29/20 (!) 164/62   Pulse Readings from Last 3 Encounters:  07/04/20 72   05/06/20 60  03/29/20 62    Current Outpatient Medications  Medication Sig Dispense Refill  . allopurinol (ZYLOPRIM) 300 MG tablet Take 300 mg by mouth daily.    Marland Kitchen aspirin EC 81 MG tablet Take 81 mg by mouth daily.    . benazepril (LOTENSIN) 40 MG tablet Take 40 mg by mouth daily.    . carvedilol (COREG) 25 MG tablet Take 25 mg by mouth 2 (two) times daily with a meal.    . doxazosin (CARDURA) 4 MG tablet Take 1 tablet (4 mg total) by mouth 2 (two) times daily. 180 tablet 0  . fluticasone (FLONASE) 50 MCG/ACT nasal spray 2 sprays daily as needed for allergies or rhinitis.    . furosemide (LASIX) 40 MG tablet TAKE 1 TABLET BY MOUTH DAILY AS NEEDED FOR SWELLING 90 tablet 3  . hydrALAZINE (APRESOLINE) 50 MG tablet Take 1 tablet (50 mg total) by mouth in the morning and at bedtime. 180 tablet 3  . montelukast (SINGULAIR) 10 MG tablet Take 10 mg by mouth daily.  tm  . rosuvastatin (CRESTOR) 20 MG tablet Take 20 mg by mouth daily.    . tamsulosin (FLOMAX) 0.4 MG CAPS capsule Take 0.4 mg by mouth daily.     No current facility-administered medications for this visit.    Allergies  Allergen Reactions  . Penicillins Rash    Past Medical History:  Diagnosis Date  .  Allergy    sesonal  . Complication of anesthesia    HX OF ENLARGED PROSTATE AND UNABLE TO PASS WATER AFTER ANESTHESIA  . Dyspnea    with some exertion  . Dysrhythmia    HX ATRIAL FIBRILLATION - HAD ABLATION - NO LONGER HAS AF.  Marland Kitchen Essential hypertension 12/22/2019  . Essential hypertension, malignant   . Gout, unspecified   . Lower extremity edema 12/22/2019  . Obesity, mild   . OSA (obstructive sleep apnea)    Mild - PT STATES HE DID NOT HAVE TO USE CPAP  . Osteoarthritis   . Osteoarthrosis, unspecified whether generalized or localized, unspecified site   . Other dyspnea and respiratory abnormality   . Prostate enlargement    DR. NESI IS PT'S UROLOGIST  . Pure hypercholesterolemia   . Renal insufficiency, mild     PT STATES NOT AWARE OF ANY KIDNEY PROBLEM    Blood pressure 132/76, pulse 72, resp. rate 14, height 6\' 5"  (1.956 m), weight (!) 315 lb (142.9 kg), SpO2 98 %.  Essential hypertension Patient with essential hypertension, and although his office readings have been normal for the past 2 visits, home readings remain elevated.  He unfortunately developed lower extremity edema when re-challenged with amlodipine.  We will stop this today and increase the hydralazine to 50 mg bid.  Patient aware he should continue with home BP monitoring and bring his readings to the next appointment.  We will see him back in 4-6 weeks for follow up.     Tommy Medal PharmD CPP [facility] [address] [place] [address] 07/04/2020 4:24 PM

## 2020-07-04 NOTE — Assessment & Plan Note (Signed)
Patient with essential hypertension, and although his office readings have been normal for the past 2 visits, home readings remain elevated.  He unfortunately developed lower extremity edema when re-challenged with amlodipine.  We will stop this today and increase the hydralazine to 50 mg bid.  Patient aware he should continue with home BP monitoring and bring his readings to the next appointment.  We will see him back in 4-6 weeks for follow up.

## 2020-07-24 ENCOUNTER — Other Ambulatory Visit: Payer: Self-pay

## 2020-07-24 ENCOUNTER — Ambulatory Visit (INDEPENDENT_AMBULATORY_CARE_PROVIDER_SITE_OTHER): Payer: Medicare PPO | Admitting: Podiatry

## 2020-07-24 DIAGNOSIS — B351 Tinea unguium: Secondary | ICD-10-CM | POA: Diagnosis not present

## 2020-07-24 DIAGNOSIS — B353 Tinea pedis: Secondary | ICD-10-CM

## 2020-07-24 MED ORDER — CLOTRIMAZOLE-BETAMETHASONE 1-0.05 % EX CREA: 1.0000 "application " | TOPICAL_CREAM | Freq: Two times a day (BID) | CUTANEOUS | 0 refills | Status: DC

## 2020-07-24 MED ORDER — CICLOPIROX 8 % EX SOLN: Freq: Every day | CUTANEOUS | 0 refills | Status: DC

## 2020-07-26 ENCOUNTER — Encounter: Payer: Self-pay | Admitting: Podiatry

## 2020-07-26 NOTE — Progress Notes (Signed)
Subjective:  Patient ID: Pedro Lee, male    DOB: 29-Feb-1940,  MRN: [id number]  Chief Complaint  Patient presents with   Foot Pain    PT is concerned that he may have a nail fungus and the skin is peeling     80 y.o. male presents with the above complaint.  Patient presents with a new complaint of bilateral plantar athlete's foot with itching and epidermolysis.  Patient also has a complaint of bilateral hallux nail fungus.  He would like to discuss treatment options for both of them.  He has not tried anything besides some over-the-counter medication none of which has helped.  He is tried soaking his foot which has not helped.  He does not have any aggravating or alleviating factor.  He would like to undergo only topical medication.  He does not want to do any kind of oral medication.  He denies any other acute complaints.   Review of Systems: Negative except as noted in the HPI. Denies N/V/F/Ch.  Past Medical History:  Diagnosis Date   Allergy    sesonal   Complication of anesthesia    HX OF ENLARGED PROSTATE AND UNABLE TO PASS WATER AFTER ANESTHESIA   Dyspnea    with some exertion   Dysrhythmia    HX ATRIAL FIBRILLATION - HAD ABLATION - NO LONGER HAS AF.   Essential hypertension 12/22/2019   Essential hypertension, malignant    Gout, unspecified    Lower extremity edema 12/22/2019   Obesity, mild    OSA (obstructive sleep apnea)    Mild - PT STATES HE DID NOT HAVE TO USE CPAP   Osteoarthritis    Osteoarthrosis, unspecified whether generalized or localized, unspecified site    Other dyspnea and respiratory abnormality    Prostate enlargement    DR. NESI IS PT'S UROLOGIST   Pure hypercholesterolemia    Renal insufficiency, mild    PT STATES NOT AWARE OF ANY KIDNEY PROBLEM    Current Outpatient Medications:    ciclopirox (PENLAC) 8 % solution, Apply topically at bedtime. Apply over nail and surrounding skin. Apply daily over previous coat. After seven (7) days, may remove  with alcohol and continue cycle., Disp: 6.6 mL, Rfl: 0   clotrimazole-betamethasone (LOTRISONE) cream, Apply 1 application topically 2 (two) times daily., Disp: 30 g, Rfl: 0   allopurinol (ZYLOPRIM) 300 MG tablet, Take 300 mg by mouth daily., Disp: , Rfl:    aspirin EC 81 MG tablet, Take 81 mg by mouth daily., Disp: , Rfl:    benazepril (LOTENSIN) 40 MG tablet, Take 40 mg by mouth daily., Disp: , Rfl:    carvedilol (COREG) 25 MG tablet, Take 25 mg by mouth 2 (two) times daily with a meal., Disp: , Rfl:    doxazosin (CARDURA) 4 MG tablet, Take 1 tablet (4 mg total) by mouth 2 (two) times daily., Disp: 180 tablet, Rfl: 0   fluticasone (FLONASE) 50 MCG/ACT nasal spray, 2 sprays daily as needed for allergies or rhinitis., Disp: , Rfl:    furosemide (LASIX) 40 MG tablet, TAKE 1 TABLET BY MOUTH DAILY AS NEEDED FOR SWELLING, Disp: 90 tablet, Rfl: 3   hydrALAZINE (APRESOLINE) 50 MG tablet, Take 1 tablet (50 mg total) by mouth in the morning and at bedtime., Disp: 180 tablet, Rfl: 3   montelukast (SINGULAIR) 10 MG tablet, Take 10 mg by mouth daily., Disp: , Rfl: tm   rosuvastatin (CRESTOR) 20 MG tablet, Take 20 mg by mouth daily., Disp: , Rfl:  tamsulosin (FLOMAX) 0.4 MG CAPS capsule, Take 0.4 mg by mouth daily., Disp: , Rfl:   Social History   Tobacco Use  Smoking Status Never  Smokeless Tobacco Never    Allergies  Allergen Reactions   Penicillins Rash   Objective:  There were no vitals filed for this visit. There is no height or weight on file to calculate BMI. Constitutional Well developed. Well nourished.  Vascular Dorsalis pedis pulses palpable bilaterally. Posterior tibial pulses palpable bilaterally. Capillary refill normal to all digits.  No cyanosis or clubbing noted. Pedal hair growth normal.  Neurologic Normal speech. Oriented to person, place, and time. Epicritic sensation to light touch grossly present bilaterally.  Dermatologic Nails thickened elongated dystrophic  bilateral hallux noted.  Pain on palpation to both of the hallux.  Mycotic nature to it Skin epidermolysis noted to bilateral plantar foot.  Subjective complaint of itching noted.  Orthopedic: Normal joint ROM without pain or crepitus bilaterally. No visible deformities. No bony tenderness.   Radiographs: None Assessment:   1. Onychomycosis due to dermatophyte   2. Tinea pedis of both feet    Plan:  Patient was evaluated and treated and all questions answered.  Bilateral athlete's foot -I explained the patient etiology of athlete's foot and various treatment options were discussed.  Given the amount of athlete's foot that is present I believe patient will benefit from Lotrisone cream.  I have asked him to apply twice a day.  He states understanding and will proceed with that -Lotrisone cream was sent to the pharmacy  Bilateral hallux onychomycosis -Educated the patient on the etiology of onychomycosis and various treatment options associated with improving the fungal load.  I explained to the patient that there is 3 treatment options available to treat the onychomycosis including topical, p.o., laser treatment.  Patient has elected to undergo topical application with Penlac.  I have asked him applied twice a day for 6 to 8 months.  Patient states understanding.  No follow-ups on file.

## 2020-08-06 ENCOUNTER — Ambulatory Visit (INDEPENDENT_AMBULATORY_CARE_PROVIDER_SITE_OTHER): Payer: Medicare PPO | Admitting: Pharmacist Clinician (PhC)/ Clinical Pharmacy Specialist

## 2020-08-06 ENCOUNTER — Other Ambulatory Visit: Payer: Self-pay

## 2020-08-06 DIAGNOSIS — I10 Essential (primary) hypertension: Secondary | ICD-10-CM

## 2020-08-06 NOTE — Assessment & Plan Note (Signed)
Patient with essential hypertension, doing better overall.  In his last set of readings the systolic range was extreme - 94-178.  In reviewing his eating habits, it appears that the highest readings are a day after he has been to a restaurant.   Otherwise his morning readings are starting to trend lower if he has not eaten out in several days.  This morning he was symptomatic at 94 systolic.  Will have him cut evening dose of doxazosin from 4 mg to 2 mg to see if we can avoid such low readings.  Otherwise no changes in mediations.  He was asked to continue with home monitoring, but to also note eating habits alongside each day.  Suspect a pattern of salt sensitivity could be the cause of the elevated readings.

## 2020-08-06 NOTE — Progress Notes (Signed)
HPI:  Pedro Lee is a 80 y.o. male patient of Dr Oval Linsey,  presents today for advanced hypertension clinic follow up.  He has been seen multiple times by both Dr. Oval Linsey and the CVRR team.  Earlier this year carvedilol dose was increased to 25mg  twice daily, doxazosin was increased to 4 mg bid and hydralazine was added to his regimen. He saw Dr. Oval Linsey in March, at which time his pressure was at goal (122/68), however home readings were much more labile.  Amlodipine 5 mg was added, but unfortunately he developed lower extremity edema.  At his most recent visit this was discontinued and hydralazine was increased to 50 mg bid.  Today he returns for follow up.   Home BP readings have varied widely since his last visit.  Systolic readings ranged from 94-178.  Overall, most of the readings were < 140, however he would have days that spiked up, then back down after 1-2 days.  This morning at home pressure was 94/56 and patient admitted to feeling lightheaded.    Blood Pressure Goal:  130/80  Current Medications:  benazepril 20 mg qd doxazosin 4 mg twice daily  carvedilol 25 mg twice daily Hydralazine 50mg  twice daily furosemide 40 mg as needed - none recently  Family Hx: father died 46, heart disease, mother 63 old age; brother now 37, sister 80 both living without heat issues; one son, no known health issues  Social Hx: no, no, coffee 1-1.5 cups per day home brewed  Diet: mostly home cooked, some salt with cooking, none at table; mix of f/v; rarely deep fried  Exercise: nothing recently, stationary bike 10 minutes most mornings  Home BP readings: home cuff Omron, noted to be accurate in previous OV 13 readings, average 152/87, HR range 58-67bpm  Intolerances: penicillin; avoid amlodipine d/t chronic lower extremity edema  Labs: 8/21: Na 145, K 4.8, Glu 94, BUN 16, Scr 1.41 GFR 55  Wt Readings from Last 3 Encounters:  08/06/20 (!) 331 lb (150.1 kg)  07/04/20 (!) 315 lb (142.9  kg)  05/06/20 (!) 315 lb (142.9 kg)   BP Readings from Last 3 Encounters:  08/06/20 (!) 102/54  07/04/20 132/76  05/06/20 122/68   Pulse Readings from Last 3 Encounters:  08/06/20 78  07/04/20 72  05/06/20 60    Current Outpatient Medications  Medication Sig Dispense Refill   allopurinol (ZYLOPRIM) 300 MG tablet Take 300 mg by mouth daily.     aspirin EC 81 MG tablet Take 81 mg by mouth daily.     benazepril (LOTENSIN) 40 MG tablet Take 40 mg by mouth daily.     carvedilol (COREG) 25 MG tablet Take 25 mg by mouth 2 (two) times daily with a meal.     ciclopirox (PENLAC) 8 % solution Apply topically at bedtime. Apply over nail and surrounding skin. Apply daily over previous coat. After seven (7) days, may remove with alcohol and continue cycle. 6.6 mL 0   clotrimazole-betamethasone (LOTRISONE) cream Apply 1 application topically 2 (two) times daily. 30 g 0   doxazosin (CARDURA) 4 MG tablet Take 1 tablet (4 mg total) by mouth 2 (two) times daily. 180 tablet 0   fluticasone (FLONASE) 50 MCG/ACT nasal spray 2 sprays daily as needed for allergies or rhinitis.     furosemide (LASIX) 40 MG tablet TAKE 1 TABLET BY MOUTH DAILY AS NEEDED FOR SWELLING 90 tablet 3   hydrALAZINE (APRESOLINE) 50 MG tablet Take 1 tablet (50 mg total)  by mouth in the morning and at bedtime. 180 tablet 3   montelukast (SINGULAIR) 10 MG tablet Take 10 mg by mouth daily.  tm   rosuvastatin (CRESTOR) 20 MG tablet Take 20 mg by mouth daily.     tamsulosin (FLOMAX) 0.4 MG CAPS capsule Take 0.4 mg by mouth daily.     No current facility-administered medications for this visit.    Allergies  Allergen Reactions   Penicillins Rash    Past Medical History:  Diagnosis Date   Allergy    sesonal   Complication of anesthesia    HX OF ENLARGED PROSTATE AND UNABLE TO PASS WATER AFTER ANESTHESIA   Dyspnea    with some exertion   Dysrhythmia    HX ATRIAL FIBRILLATION - HAD ABLATION - NO LONGER HAS AF.   Essential  hypertension 12/22/2019   Essential hypertension, malignant    Gout, unspecified    Lower extremity edema 12/22/2019   Obesity, mild    OSA (obstructive sleep apnea)    Mild - PT STATES HE DID NOT HAVE TO USE CPAP   Osteoarthritis    Osteoarthrosis, unspecified whether generalized or localized, unspecified site    Other dyspnea and respiratory abnormality    Prostate enlargement    DR. NESI IS PT'S UROLOGIST   Pure hypercholesterolemia    Renal insufficiency, mild    PT STATES NOT AWARE OF ANY KIDNEY PROBLEM    Blood pressure (!) 102/54, pulse 78, resp. rate 17, height 6\' 5"  (1.956 m), weight (!) 331 lb (150.1 kg), SpO2 98 %.  Essential hypertension Patient with essential hypertension, doing better overall.  In his last set of readings the systolic range was extreme - 94-178.  In reviewing his eating habits, it appears that the highest readings are a day after he has been to a restaurant.   Otherwise his morning readings are starting to trend lower if he has not eaten out in several days.  This morning he was symptomatic at 94 systolic.  Will have him cut evening dose of doxazosin from 4 mg to 2 mg to see if we can avoid such low readings.  Otherwise no changes in mediations.  He was asked to continue with home monitoring, but to also note eating habits alongside each day.  Suspect a pattern of salt sensitivity could be the cause of the elevated readings.     Tommy Medal PharmD CPP [facility] [address] [place] [address] 08/06/2020 9:46 AM

## 2020-08-06 NOTE — Patient Instructions (Signed)
Return for a a follow up appointment August 29 at 9 am  Call if your morning blood pressures continue to cause you to be lightheaded.  Erasmo Downer at [phone]  Check your blood pressure at home daily (if able) and keep record of the readings.  Take your BP meds as follows:  Cut evening doxazosin tablet in half - take 2 mg each evening instead of 4 mg  Continue with all other medications  Bring all of your meds, your BP cuff and your record of home blood pressures to your next appointment.  Exercise as you're able, try to walk approximately 30 minutes per day.  Keep salt intake to a minimum, especially watch canned and prepared boxed foods.  Eat more fresh fruits and vegetables and fewer canned items.  Avoid eating in fast food restaurants.    HOW TO TAKE YOUR BLOOD PRESSURE: Rest 5 minutes before taking your blood pressure.  Don't smoke or drink caffeinated beverages for at least 30 minutes before. Take your blood pressure before (not after) you eat. Sit comfortably with your back supported and both feet on the floor (don't cross your legs). Elevate your arm to heart level on a table or a desk. Use the proper sized cuff. It should fit smoothly and snugly around your bare upper arm. There should be enough room to slip a fingertip under the cuff. The bottom edge of the cuff should be 1 inch above the crease of the elbow. Ideally, take 3 measurements at one sitting and record the average.

## 2020-08-31 ENCOUNTER — Other Ambulatory Visit: Payer: Self-pay | Admitting: Cardiovascular Disease

## 2020-10-08 ENCOUNTER — Ambulatory Visit (INDEPENDENT_AMBULATORY_CARE_PROVIDER_SITE_OTHER): Payer: Medicare PPO | Admitting: Pharmacist Clinician (PhC)/ Clinical Pharmacy Specialist

## 2020-10-08 ENCOUNTER — Other Ambulatory Visit: Payer: Self-pay

## 2020-10-08 DIAGNOSIS — I10 Essential (primary) hypertension: Secondary | ICD-10-CM | POA: Diagnosis not present

## 2020-10-08 NOTE — Assessment & Plan Note (Signed)
Patient with resistant hypertension, now doing well on combination of 4 medications.  Home readings appear to be stable and patient is tolerating everything without concern.  Will have him continue with current regimen and continue to monitor readings 2-3 times each week.  He will see Dr Oval Linsey in 3 months for follow up.

## 2020-10-08 NOTE — Patient Instructions (Addendum)
Return for a a follow up appointment with Dr. Oval Linsey on December 13 at 10:00 am  Check your blood pressure at home 2-3 times each week and keep record of the readings.  Take your BP meds as follows:  Continue with all current medications.  If you need refills on anything, please reach out to your pharmacy.  Bring all of your meds, your BP cuff and your record of home blood pressures to your next appointment.  Exercise as you're able, try to walk approximately 30 minutes per day.  Keep salt intake to a minimum, especially watch canned and prepared boxed foods.  Eat more fresh fruits and vegetables and fewer canned items.  Avoid eating in fast food restaurants.    HOW TO TAKE YOUR BLOOD PRESSURE: Rest 5 minutes before taking your blood pressure.  Don't smoke or drink caffeinated beverages for at least 30 minutes before. Take your blood pressure before (not after) you eat. Sit comfortably with your back supported and both feet on the floor (don't cross your legs). Elevate your arm to heart level on a table or a desk. Use the proper sized cuff. It should fit smoothly and snugly around your bare upper arm. There should be enough room to slip a fingertip under the cuff. The bottom edge of the cuff should be 1 inch above the crease of the elbow. Ideally, take 3 measurements at one sitting and record the average.

## 2020-10-08 NOTE — Progress Notes (Signed)
HPI:  Pedro Lee is a 80 y.o. male patient of Dr Oval Linsey,  presents today for advanced hypertension clinic follow up.  He has been seen multiple times by both Dr. Oval Linsey and the CVRR team.  Earlier this year carvedilol dose was increased to '25mg'$  twice daily, doxazosin was increased to 4 mg bid and hydralazine was added to his regimen. He saw Dr. Oval Linsey in March, at which time his pressure was at goal (122/68), however home readings were much more labile.  Amlodipine 5 mg was added, but unfortunately he developed lower extremity edema.   At his last visit blood pressure was well controlled, but was noting some readings dropping to < 123XX123 systolic.  Decreased doxazosin dose from 4 mg to 2 mg each night.    Today he returns for follow up.   No home readings with him, but states mostly in the 123456 systolic range.  Did have a few days up to 160, but that was when he had Covid earlier this month and stopped all meds for about 3-4 days.   Since resuming, his readings are mostly back in normal range.  He does note having had to use the furosemide a little more in the past month, otherwise feeling well, with no complaints.    Blood Pressure Goal:  130/80  Current Medications:  benazepril 20 mg qd doxazosin 2 mg twice daily  carvedilol 25 mg twice daily Hydralazine '50mg'$  twice daily furosemide 40 mg as needed - none recently  Family Hx: father died 74, heart disease, mother 40 old age; brother now 51, sister 12 both living without heat issues; one son, no known health issues  Social Hx: no, no, coffee 1-1.5 cups per day home brewed  Diet: mostly home cooked, some salt with cooking, none at table; mix of f/v; rarely deep fried  Exercise: nothing recently, stationary bike 10 minutes most mornings., nothing recent   Home BP readings: home cuff Omron, noted to be accurate in previous OV 13 readings, average 152/87, HR range 58-67bpm  Intolerances: penicillin; avoid amlodipine d/t chronic lower  extremity edema  Labs: 8/21: Na 145, K 4.8, Glu 94, BUN 16, Scr 1.41 GFR 55  Wt Readings from Last 3 Encounters:  10/08/20 (!) 319 lb (144.7 kg)  08/06/20 (!) 331 lb (150.1 kg)  07/04/20 (!) 315 lb (142.9 kg)   BP Readings from Last 3 Encounters:  10/08/20 138/82  08/06/20 (!) 102/54  07/04/20 132/76   Pulse Readings from Last 3 Encounters:  10/08/20 68  08/06/20 78  07/04/20 72    Current Outpatient Medications  Medication Sig Dispense Refill   allopurinol (ZYLOPRIM) 300 MG tablet Take 300 mg by mouth daily.     aspirin EC 81 MG tablet Take 81 mg by mouth daily.     benazepril (LOTENSIN) 40 MG tablet Take 40 mg by mouth daily.     carvedilol (COREG) 25 MG tablet Take 25 mg by mouth 2 (two) times daily with a meal.     ciclopirox (PENLAC) 8 % solution Apply topically at bedtime. Apply over nail and surrounding skin. Apply daily over previous coat. After seven (7) days, may remove with alcohol and continue cycle. 6.6 mL 0   clotrimazole-betamethasone (LOTRISONE) cream Apply 1 application topically 2 (two) times daily. 30 g 0   doxazosin (CARDURA) 4 MG tablet Take 1 tablet (4 mg total) by mouth 2 (two) times daily. (Patient taking differently: Take 4 mg by mouth 2 (two) times daily.  Take 4 mg each morning and 2 mg each night) 180 tablet 0   fluticasone (FLONASE) 50 MCG/ACT nasal spray 2 sprays daily as needed for allergies or rhinitis.     furosemide (LASIX) 40 MG tablet TAKE 1 TABLET BY MOUTH DAILY AS NEEDED FOR SWELLING 90 tablet 3   hydrALAZINE (APRESOLINE) 50 MG tablet Take 1 tablet (50 mg total) by mouth in the morning and at bedtime. 180 tablet 3   montelukast (SINGULAIR) 10 MG tablet Take 10 mg by mouth daily.  tm   rosuvastatin (CRESTOR) 20 MG tablet Take 20 mg by mouth daily.     tamsulosin (FLOMAX) 0.4 MG CAPS capsule Take 0.4 mg by mouth daily.     No current facility-administered medications for this visit.    Allergies  Allergen Reactions   Penicillins Rash     Past Medical History:  Diagnosis Date   Allergy    sesonal   Complication of anesthesia    HX OF ENLARGED PROSTATE AND UNABLE TO PASS WATER AFTER ANESTHESIA   Dyspnea    with some exertion   Dysrhythmia    HX ATRIAL FIBRILLATION - HAD ABLATION - NO LONGER HAS AF.   Essential hypertension 12/22/2019   Essential hypertension, malignant    Gout, unspecified    Lower extremity edema 12/22/2019   Obesity, mild    OSA (obstructive sleep apnea)    Mild - PT STATES HE DID NOT HAVE TO USE CPAP   Osteoarthritis    Osteoarthrosis, unspecified whether generalized or localized, unspecified site    Other dyspnea and respiratory abnormality    Prostate enlargement    DR. NESI IS PT'S UROLOGIST   Pure hypercholesterolemia    Renal insufficiency, mild    PT STATES NOT AWARE OF ANY KIDNEY PROBLEM    Blood pressure 138/82, pulse 68, resp. rate 15, height '6\' 4"'$  (1.93 m), weight (!) 319 lb (144.7 kg), SpO2 95 %.  Essential hypertension Patient with resistant hypertension, now doing well on combination of 4 medications.  Home readings appear to be stable and patient is tolerating everything without concern.  Will have him continue with current regimen and continue to monitor readings 2-3 times each week.  He will see Dr Oval Linsey in 3 months for follow up.     Tommy Medal PharmD CPP [facility] [address] [place] [address] 10/08/2020 9:46 AM

## 2020-10-25 ENCOUNTER — Encounter: Payer: Self-pay | Admitting: Podiatry

## 2020-10-25 ENCOUNTER — Ambulatory Visit: Payer: Medicare PPO | Admitting: Podiatry

## 2020-10-25 ENCOUNTER — Other Ambulatory Visit: Payer: Self-pay

## 2020-10-25 DIAGNOSIS — M79674 Pain in right toe(s): Secondary | ICD-10-CM | POA: Diagnosis not present

## 2020-10-25 DIAGNOSIS — B351 Tinea unguium: Secondary | ICD-10-CM | POA: Diagnosis not present

## 2020-10-25 DIAGNOSIS — M79675 Pain in left toe(s): Secondary | ICD-10-CM | POA: Diagnosis not present

## 2020-10-25 NOTE — Progress Notes (Signed)
  Subjective:  Patient ID: Pedro Lee, male    DOB: July 17, 1940,  MRN: [id number]  Chief Complaint  Patient presents with   Nail Problem    PT stated that he is doing well    80 y.o. male returns for the above complaint.  Patient presents with thickened elongated dystrophic toenails x10.  Mild pain on palpation.  Patient would like to have them debrided down his not able to do it himself.  He denies any other acute complaints.  Objective:  There were no vitals filed for this visit. Podiatric Exam: Vascular: dorsalis pedis and posterior tibial pulses are palpable bilateral. Capillary return is immediate. Temperature gradient is WNL. Skin turgor WNL  Sensorium: Normal Semmes Weinstein monofilament test. Normal tactile sensation bilaterally. Nail Exam: Pt has thick disfigured discolored nails with subungual debris noted bilateral entire nail hallux through fifth toenails.  Pain on palpation to the nails. Ulcer Exam: There is no evidence of ulcer or pre-ulcerative changes or infection. Orthopedic Exam: Muscle tone and strength are WNL. No limitations in general ROM. No crepitus or effusions noted.  Skin: No Porokeratosis. No infection or ulcers    Assessment & Plan:   1. Pain due to onychomycosis of toenails of both feet     Patient was evaluated and treated and all questions answered.  Onychomycosis with pain  -Nails palliatively debrided as below. -Educated on self-care  Procedure: Nail Debridement Rationale: pain  Type of Debridement: manual, sharp debridement. Instrumentation: Nail nipper, rotary burr. Number of Nails: 10  Procedures and Treatment: Consent by patient was obtained for treatment procedures. The patient understood the discussion of treatment and procedures well. All questions were answered thoroughly reviewed. Debridement of mycotic and hypertrophic toenails, 1 through 5 bilateral and clearing of subungual debris. No ulceration, no infection noted.  Return  Visit-Office Procedure: Patient instructed to return to the office for a follow up visit 3 months for continued evaluation and treatment.  Boneta Lucks, DPM    Return in about 3 months (around 01/24/2021) for [facility] .

## 2020-11-29 ENCOUNTER — Other Ambulatory Visit: Payer: Self-pay | Admitting: Internal Medicine

## 2020-11-29 ENCOUNTER — Ambulatory Visit
Admission: RE | Admit: 2020-11-29 | Discharge: 2020-11-29 | Disposition: A | Discharge status: Home / Self Care | Payer: Medicare PPO | Source: Ambulatory Visit | Attending: Internal Medicine | Admitting: Internal Medicine

## 2020-11-29 DIAGNOSIS — M542 Cervicalgia: Secondary | ICD-10-CM

## 2020-12-26 ENCOUNTER — Telehealth: Payer: Self-pay | Admitting: Cardiovascular Disease

## 2020-12-26 NOTE — Telephone Encounter (Signed)
Pt wants to know if Dr. Oval Linsey intended for his Ultrasound appt to be after his office visit? I asked pt if he wanted to r/s the OV after the Ultrasound but he refused

## 2020-12-26 NOTE — Telephone Encounter (Signed)
Spoke with pt, he wanted the appointment changed to after the ultrasound was completed. Follow up scheduled

## 2021-01-21 ENCOUNTER — Ambulatory Visit (HOSPITAL_BASED_OUTPATIENT_CLINIC_OR_DEPARTMENT_OTHER): Payer: Medicare PPO | Admitting: Cardiovascular Disease

## 2021-01-23 ENCOUNTER — Other Ambulatory Visit: Payer: Self-pay

## 2021-01-23 DIAGNOSIS — I7143 Infrarenal abdominal aortic aneurysm, without rupture: Secondary | ICD-10-CM

## 2021-01-24 ENCOUNTER — Ambulatory Visit: Payer: Medicare PPO | Admitting: Podiatry

## 2021-01-24 ENCOUNTER — Other Ambulatory Visit: Payer: Self-pay

## 2021-01-24 ENCOUNTER — Encounter: Payer: Self-pay | Admitting: Podiatry

## 2021-01-24 DIAGNOSIS — B351 Tinea unguium: Secondary | ICD-10-CM

## 2021-01-24 DIAGNOSIS — M79675 Pain in left toe(s): Secondary | ICD-10-CM | POA: Diagnosis not present

## 2021-01-24 DIAGNOSIS — M79674 Pain in right toe(s): Secondary | ICD-10-CM | POA: Diagnosis not present

## 2021-01-27 ENCOUNTER — Other Ambulatory Visit: Payer: Self-pay

## 2021-01-27 ENCOUNTER — Other Ambulatory Visit (HOSPITAL_COMMUNITY): Payer: Self-pay | Admitting: Cardiovascular Disease

## 2021-01-27 ENCOUNTER — Ambulatory Visit (HOSPITAL_COMMUNITY)
Admission: RE | Admit: 2021-01-27 | Discharge: 2021-01-27 | Disposition: A | Discharge status: Home / Self Care | Payer: Medicare PPO | Source: Ambulatory Visit | Attending: Cardiology | Admitting: Cardiology

## 2021-01-27 ENCOUNTER — Ambulatory Visit (HOSPITAL_BASED_OUTPATIENT_CLINIC_OR_DEPARTMENT_OTHER): Payer: Medicare PPO | Admitting: Cardiovascular Disease

## 2021-01-27 ENCOUNTER — Encounter (HOSPITAL_BASED_OUTPATIENT_CLINIC_OR_DEPARTMENT_OTHER): Payer: Self-pay | Admitting: Cardiovascular Disease

## 2021-01-27 VITALS — BP 126/70 | HR 64 | Ht 76.0 in | Wt 320.6 lb

## 2021-01-27 DIAGNOSIS — E7800 Pure hypercholesterolemia, unspecified: Secondary | ICD-10-CM | POA: Insufficient documentation

## 2021-01-27 DIAGNOSIS — I5033 Acute on chronic diastolic (congestive) heart failure: Secondary | ICD-10-CM

## 2021-01-27 DIAGNOSIS — I701 Atherosclerosis of renal artery: Secondary | ICD-10-CM

## 2021-01-27 DIAGNOSIS — R0609 Other forms of dyspnea: Secondary | ICD-10-CM | POA: Insufficient documentation

## 2021-01-27 DIAGNOSIS — I10 Essential (primary) hypertension: Secondary | ICD-10-CM | POA: Diagnosis not present

## 2021-01-27 DIAGNOSIS — Z6839 Body mass index (BMI) 39.0-39.9, adult: Secondary | ICD-10-CM

## 2021-01-27 DIAGNOSIS — Z79899 Other long term (current) drug therapy: Secondary | ICD-10-CM

## 2021-01-27 HISTORY — DX: Other forms of dyspnea: R06.09

## 2021-01-27 HISTORY — DX: Pure hypercholesterolemia, unspecified: E78.00

## 2021-01-27 MED ORDER — DOXAZOSIN MESYLATE 2 MG PO TABS
2.0000 mg | ORAL_TABLET | Freq: Every day | ORAL | 3 refills | Status: DC
Start: 2021-01-27 — End: 2021-05-22

## 2021-01-27 NOTE — Assessment & Plan Note (Signed)
Pedro Lee is not getting as much exercise as recommended.  Ideally he should be getting at least 150 minutes weekly.  We will refer him to the PR EP program to the [facility]

## 2021-01-27 NOTE — Assessment & Plan Note (Addendum)
Mr. Nienhuis has significant lower extremity edema.  His JVP is also elevated.  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  He did have an echo last year that revealed grade 1 diastolic dysfunction.  He has Lasix but has not been using it.  He will start taking 40 mg daily for 3 days and check a BMP and a BNP next week.

## 2021-01-27 NOTE — Assessment & Plan Note (Signed)
Continue rosuvastatin.  

## 2021-01-27 NOTE — Patient Instructions (Signed)
Medication Instructions:  TAKE Lasix 40 mg by mouth for 3 days  *If you need a refill on your cardiac medications before your next appointment, please call your pharmacy*   Lab Work: BNP and BMP  If you have labs (blood work) drawn today and your tests are completely normal, you will receive your results only by: Victoria Vera (if you have MyChart) OR A paper copy in the mail If you have any lab test that is abnormal or we need to change your treatment, we will call you to review the results.   Testing/Procedures: None Ordered   Follow-Up: At [facility], you and your health needs are our priority.  As part of our continuing mission to provide you with exceptional heart care, we have created designated Provider Care Teams.  These Care Teams include your primary Cardiologist (physician) and Advanced Practice Providers (APPs -  Physician Assistants and Nurse Practitioners) who all work together to provide you with the care you need, when you need it.  We recommend signing up for the patient portal called "MyChart".  Sign up information is provided on this After Visit Summary.  MyChart is used to connect with patients for Virtual Visits (Telemedicine).  Patients are able to view lab/test results, encounter notes, upcoming appointments, etc.  Non-urgent messages can be sent to your provider as well.   To learn more about what you can do with MyChart, go to [email or web address].    Your next appointment:   3 month(s)  The format for your next appointment:   In Person  Provider:   Skeet Latch, MD

## 2021-01-27 NOTE — Assessment & Plan Note (Signed)
He reports exertional dyspnea that is little worse than last year.  He did have a stress test and echo last year that was relatively unremarkable.  His JVP is elevated and he has not been using his Lasix.  I think this is mostly heart failure.  He is going to increase his Lasix and then we will see what his numbers look like next week.  No plan for repeat ischemic evaluation at this time.

## 2021-01-27 NOTE — Assessment & Plan Note (Signed)
Blood pressure was initially elevated but better on repeat.  He notes that on higher doses of doxazosin his blood pressure was getting low when he was getting dizzy.  Continue carvedilol, benazepril, hydralazine, and doxazosin.  Encouraged him to increase his exercise.  We will refer him to the PREP program at the [facility].

## 2021-01-27 NOTE — Progress Notes (Signed)
Advanced Hypertension Clinic Follow-up:   Date:  01/27/2021   ID:  Pedro Lee, DOB 21-Feb-1940, MRN [id number]  PCP:  Rogers Blocker, MD  Cardiologist:  Sinclair Grooms, MD   Referring MD: Rogers Blocker, MD   CC: Hypertension  History of Present Illness:    Pedro Lee is a 80 y.o. 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 He was initially seen 05/06/2020 to establish care in the hypertension clinic.  He saw Dr. Tamala Julian in 2019 for exertional dyspnea.  His symptoms were thought to be due to deconditioning, hypertension, and obesity.  He subsequently followed up with Estella Husk, PA-C, on 10/2019.  At that time his blood pressure was running high.  Prior to that he had seen his PCP and doxazosin was reduced due to low blood pressure and exertional dyspnea.  He had a [employer] 10/2019 that was negative for ischemia.  Systolic function was normal.  He saw Estella Husk back 11/2019 and his doxazosin was increased to 2 mg twice daily.  He had a virtual follow-up 11/2019 and his blood pressure was running high in the evenings.  He was working out and limiting his sodium intake.  They decided to increase his doxazosin because he was also struggling with his prostate.  He was referred to advanced hypertension clinic for further management.  Lately his BP has been ranging 130-170s/70-90s.  He has been mostly checking it in the mornings.  He got a new machine about 2 months ago.  Pedro Lee had his pituitary tumor removed about 5 years ago.  He does not think he follows up with endocrinology.  He lost his wife of 24 years in May 2021 and has struggled since that time.  Thyroid function and cortisol are within normal limits.  At his initial visit he reported leg discomfort.  He had ABIs 01/2020 that were within normal limits.  His symptoms were thought to be mostly due to venous insufficiency and he was given furosemide  to take as needed.  He saw our PharmD 02/2020 and hydralazine was added to his regimen.  On 03/2020 he had increased LE edema and was not using furosemide.  He was confused about his medications and carvedilol and doxazosin were increased. We discussed taking his medications more consistently at the same time each day. Hydralazine was stopped and he was started on amlodipine. He had repeat renal artery dopplers 01/27/2021, which continued to have mild to moderate blockage bilaterally. Amlodipine was stopped due to LE edema and hydralazine was increased. His blood pressures started getting too low, so doxazosin was reduced.  Today, he has been feeling good overall. 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 He also has stiffness and pain in his left neck. He is scheduled to see a neurologist concerning this. For exercise he tries to walk 2-3 times a week, and he uses a stationary bicycle for 15 minutes at a time. Generally he feels will with exercise, but he has shortness of breath while walking up inclines specifically. He considers this to have worsened since last year. At home his blood pressure has averaged in the 140s/80s. He notes it is typically for his blood pressure to fluctuate similar to in clinic today (144/80, repeat: 126/70). Currently he is taking 1/2 tablet of doxazosin twice a day. It was reduced after his blood pressure was too low and he  could feel it occurring. Usually he wears compression stockings, but he is not wearing them today. He does not use furosemide very often. Lately, he has been driving to [facility], and taking his diuretic those days would be troublesome. He denies any palpitations, or chest pain. No lightheadedness, headaches, syncope, orthopnea, or PND.   Past Medical History:  Diagnosis Date   Acute on chronic diastolic heart failure ([place]) 12/22/2019   Allergy    sesonal   Complication of anesthesia    HX OF ENLARGED PROSTATE AND UNABLE TO PASS WATER  AFTER ANESTHESIA   Dyspnea    with some exertion   Dysrhythmia    HX ATRIAL FIBRILLATION - HAD ABLATION - NO LONGER HAS AF.   Essential hypertension 12/22/2019   Essential hypertension, malignant    Exertional dyspnea 01/27/2021   Gout, unspecified    Lower extremity edema 12/22/2019   Obesity, mild    OSA (obstructive sleep apnea)    Mild - PT STATES HE DID NOT HAVE TO USE CPAP   Osteoarthritis    Osteoarthrosis, unspecified whether generalized or localized, unspecified site    Other dyspnea and respiratory abnormality    Prostate enlargement    DR. NESI IS PT'S UROLOGIST   Pure hypercholesterolemia    Pure hypercholesterolemia 01/27/2021   Renal insufficiency, mild    PT STATES NOT AWARE OF ANY KIDNEY PROBLEM    Past Surgical History:  Procedure Laterality Date   COLONOSCOPY     CRANIOTOMY N/A 03/24/2016   Procedure: Transphenoidal Resection of Tumor;  Surgeon: Earnie Larsson, MD;  Location: [place];  Service: Neurosurgery;  Laterality: N/A;  Transphenoidal Resection of Tumor   HEART ABLATION FOR AF  1998   JOINT REPLACEMENT     LEFT TOTAL KNEE REPLACEMENT   KNEE ARTHROPLASTY Left 2013ish   LEFT KNEE ARTHROSCOPY     TOTAL KNEE ARTHROPLASTY Right 05/30/2013   Procedure: RIGHT TOTAL KNEE ARTHROPLASTY;  Surgeon: Mauri Pole, MD;  Location: [facility];  Service: Orthopedics;  Laterality: Right;   TRANSNASAL APPROACH N/A 03/24/2016   Procedure: TRANSNASAL APPROACH;  Surgeon: Rozetta Nunnery, MD;  Location: [facility];  Service: ENT;  Laterality: N/A;  TRANSNASAL APPROACH    Current Medications: Current Meds  Medication Sig   allopurinol (ZYLOPRIM) 300 MG tablet Take 300 mg by mouth daily.   aspirin EC 81 MG tablet Take 81 mg by mouth daily.   benazepril (LOTENSIN) 40 MG tablet Take 40 mg by mouth daily.   carvedilol (COREG) 25 MG tablet Take 25 mg by mouth 2 (two) times daily with a meal.   doxazosin (CARDURA) 2 MG tablet Take 1 tablet (2 mg total) by mouth daily.   fluticasone  (FLONASE) 50 MCG/ACT nasal spray 2 sprays daily as needed for allergies or rhinitis.   furosemide (LASIX) 40 MG tablet TAKE 1 TABLET BY MOUTH DAILY AS NEEDED FOR SWELLING   hydrALAZINE (APRESOLINE) 50 MG tablet Take 1 tablet (50 mg total) by mouth in the morning and at bedtime.   montelukast (SINGULAIR) 10 MG tablet Take 10 mg by mouth daily.   rosuvastatin (CRESTOR) 20 MG tablet Take 20 mg by mouth daily.   tamsulosin (FLOMAX) 0.4 MG CAPS capsule Take 0.4 mg by mouth daily.   [DISCONTINUED] doxazosin (CARDURA) 4 MG tablet Take 1 tablet (4 mg total) by mouth 2 (two) times daily. (Patient taking differently: Take 4 mg by mouth 2 (two) times daily. Take 4 mg each morning and 2 mg each night)  Allergies:   Penicillins   Social History   Socioeconomic History   Marital status: Married    Spouse name: Not on file   Number of children: Not on file   Years of education: Not on file   Highest education level: Not on file  Occupational History   Not on file  Tobacco Use   Smoking status: Never   Smokeless tobacco: Never  Vaping Use   Vaping Use: Never used  Substance and Sexual Activity   Alcohol use: No    Alcohol/week: 0.0 standard drinks   Drug use: No   Sexual activity: Not on file  Other Topics Concern   Not on file  Social History Narrative   Not on file   Social Determinants of Health   Financial Resource Strain: Low Risk    Difficulty of Paying Living Expenses: Not hard at all  Food Insecurity: No Food Insecurity   Worried About Charity fundraiser in the Last Year: Never true   [employer] in the Last Year: Never true  Transportation Needs: No Transportation Needs   Lack of Transportation (Medical): No   Lack of Transportation (Non-Medical): No  Physical Activity: Inactive   Days of Exercise per Week: 0 days   Minutes of Exercise per Session: 0 min  Stress: No Stress Concern Present   Feeling of Stress : Only a little  Social Connections: Not on file      Family History: The patient's family history includes Heart disease (age of onset: 64) in his father.  ROS:   Please see the history of present illness.    (+) Arthralgias in bilateral shoulders (+) Stiffness and pain in left neck (+) Exertional shortness of breath All other systems reviewed and are negative.  EKGs/Labs/Other Studies Reviewed:    EKG:   01/27/2021: Sinus rhythm. Rate 64 bpm. Lateral ST changes.  Bilateral Renal Artery Doppler 01/27/2021 (Preliminary Results): Summary:  Largest Aortic Diameter: 2.9 cm, upper limits of normal     Renal:     Right: Abnormal size for the right kidney. Normal right Resistive         Index. Normal cortical thickness of right kidney. 1-59%         stenosis of the right renal artery. RRV flow present.  Left:  Abnormal size for the left kidney. Normal left Resistive         Index. Normal cortical thickness of the left kidney. 1-59%         stenosis of the left renal artery. LRV flow present.  Mesenteric:  Normal Celiac artery and Superior Mesenteric artery findings.     Patent IVC.   Lexiscan Myoview 11/07/2019: Nuclear stress EF: 60%. The left ventricular ejection fraction is normal (55-65%). There was no ST segment deviation noted during stress. The study is normal.  Echo 11/07/2019:  1. Left ventricular ejection fraction, by estimation, is 60 to 65%. The  left ventricle has normal function. The left ventricle has no regional  wall motion abnormalities. The left ventricular internal cavity size was  mildly dilated. There is mild left  ventricular hypertrophy. Left ventricular diastolic parameters are  consistent with Grade I diastolic dysfunction (impaired relaxation).   2. Right ventricular systolic function is normal. The right ventricular  size is mildly enlarged.   3. Right atrial size was mildly dilated.   4. The mitral valve is normal in structure. Mild mitral valve  regurgitation. No evidence of mitral stenosis.  5. The aortic valve is tricuspid. Aortic valve regurgitation is not  visualized. Mild aortic valve sclerosis is present, with no evidence of  aortic valve stenosis.   6. Aortic dilatation noted. There is mild dilatation of the aortic root,  measuring 39 mm.   Recent Labs: No results found for requested labs within last 8760 hours.   Recent Lipid Panel No results found for: CHOL, TRIG, HDL, CHOLHDL, VLDL, LDLCALC, LDLDIRECT  Physical Exam:    VS:  BP 126/70 (BP Location: Right Arm, Patient Position: Sitting, Cuff Size: Large)    Pulse 64    Ht 6\' 4"  (1.93 m)    Wt (!) 320 lb 9.6 oz (145.4 kg)    SpO2 95%    BMI 39.02 kg/m  , BMI Body mass index is 39.02 kg/m. GENERAL:  Well appearing HEENT: Pupils equal round and reactive, fundi not visualized, oral mucosa unremarkable NECK:  No jugular venous distention, waveform within normal limits, carotid upstroke brisk and symmetric, no bruits LUNGS:  Clear to auscultation bilaterally HEART:  RRR.  PMI not displaced or sustained,S1 and S2 within normal limits, no S3, no S4, no clicks, no rubs, no murmurs ABD:  Flat, positive bowel sounds normal in frequency in pitch, no bruits, no rebound, no guarding, no midline pulsatile mass, no hepatomegaly, no splenomegaly EXT:  2 plus right DP pulse.  Unable to palpate left DP/TP or right TP.  Suspect edema is contributing.  2+ LE edema to the mid tibia bilaterally, no cyanosis no clubbing SKIN:  No rashes no nodules NEURO:  Cranial nerves II through XII grossly intact, motor grossly intact throughout PSYCH:  Cognitively intact, oriented to person place and time  ASSESSMENT:    1. DOE (dyspnea on exertion)   2. Essential hypertension   3. Class 2 severe obesity due to excess calories with serious comorbidity and body mass index (BMI) of 39.0 to 39.9 in adult ([place])   4. Acute on chronic diastolic heart failure ([place])   5. Pure hypercholesterolemia   6. Medication management     PLAN:    Essential  hypertension Blood pressure was initially elevated but better on repeat.  He notes that on higher doses of doxazosin his blood pressure was getting low when he was getting dizzy.  Continue carvedilol, benazepril, hydralazine, and doxazosin.  Encouraged him to increase his exercise.  We will refer him to the PREP program at the [facility].  Obese Pedro Lee is not getting as much exercise as recommended.  Ideally he should be getting at least 150 minutes weekly.  We will refer him to the PR EP program to the [facility].  Acute on chronic diastolic heart failure [facility]) Pedro Lee has significant lower extremity edema.  His JVP is also elevated.  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  He did have an echo last year that revealed grade 1 diastolic dysfunction.  He has Lasix but has not been using it.  He will start taking 40 mg daily for 3 days and check a BMP and a BNP next week.  Pure hypercholesterolemia Continue rosuvastatin.  Exertional dyspnea He reports exertional dyspnea that is little worse than last year.  He did have a stress test and echo last year that was relatively unremarkable.  His JVP is elevated and he has not been using his Lasix.  I think this is mostly heart failure.  He is going to increase his Lasix and then we will see what his numbers look like  next week.  No plan for repeat ischemic evaluation at this time.   Disposition:    FU with Krisann Mckenna C. Oval Linsey, MD, [facility] in 2-3 months.   Medication Adjustments/Labs and Tests Ordered: Current medicines are reviewed at length with the patient today.  Concerns regarding medicines are outlined above.   Orders Placed This Encounter  Procedures   B Nat Peptide   Basic Metabolic Panel (BMET)   EKG 12-Lead   Meds ordered this encounter  Medications   doxazosin (CARDURA) 2 MG tablet    Sig: Take 1 tablet (2 mg total) by mouth daily.    Dispense:  90 tablet    Refill:  3   I,Mathew Stumpf,acting as a scribe for Skeet Latch,  MD.,have documented all relevant documentation on the behalf of Skeet Latch, MD,as directed by  Skeet Latch, MD while in the presence of Skeet Latch, MD.  I, [place] Oval Linsey, MD have reviewed all documentation for this visit.  The documentation of the exam, diagnosis, procedures, and orders on 01/27/2021 are all accurate and complete.   Signed, Skeet Latch, MD  01/27/2021 5:14 PM    [facility]

## 2021-01-28 NOTE — Addendum Note (Signed)
Addended by: Vennie Homans on: 01/28/2021 10:54 AM   Modules accepted: Orders

## 2021-01-29 ENCOUNTER — Encounter: Payer: Self-pay | Admitting: Podiatry

## 2021-01-29 NOTE — Progress Notes (Signed)
°  Subjective:  Patient ID: Pedro Lee, male    DOB: 01/01/1941,  MRN: [id number]  Chief Complaint  Patient presents with   Nail Problem    Nail trim    80 y.o. male returns for the above complaint.  Patient presents with thickened elongated dystrophic toenails x10.  Mild pain on palpation.  Patient would like to have them debrided down his not able to do it himself.  He denies any other acute complaints.  Objective:  There were no vitals filed for this visit. Podiatric Exam: Vascular: dorsalis pedis and posterior tibial pulses are palpable bilateral. Capillary return is immediate. Temperature gradient is WNL. Skin turgor WNL  Sensorium: Normal Semmes Weinstein monofilament test. Normal tactile sensation bilaterally. Nail Exam: Pt has thick disfigured discolored nails with subungual debris noted bilateral entire nail hallux through fifth toenails.  Pain on palpation to the nails. Ulcer Exam: There is no evidence of ulcer or pre-ulcerative changes or infection. Orthopedic Exam: Muscle tone and strength are WNL. No limitations in general ROM. No crepitus or effusions noted.  Skin: No Porokeratosis. No infection or ulcers    Assessment & Plan:   1. Pain due to onychomycosis of toenails of both feet      Patient was evaluated and treated and all questions answered.  Onychomycosis with pain  -Nails palliatively debrided as below. -Educated on self-care  Procedure: Nail Debridement Rationale: pain  Type of Debridement: manual, sharp debridement. Instrumentation: Nail nipper, rotary burr. Number of Nails: 10  Procedures and Treatment: Consent by patient was obtained for treatment procedures. The patient understood the discussion of treatment and procedures well. All questions were answered thoroughly reviewed. Debridement of mycotic and hypertrophic toenails, 1 through 5 bilateral and clearing of subungual debris. No ulceration, no infection noted.  Return Visit-Office Procedure:  Patient instructed to return to the office for a follow up visit 3 months for continued evaluation and treatment.  Boneta Lucks, DPM    No follow-ups on file.

## 2021-01-30 ENCOUNTER — Other Ambulatory Visit: Payer: Self-pay | Admitting: Cardiovascular Disease

## 2021-01-30 ENCOUNTER — Telehealth: Payer: Self-pay

## 2021-01-30 DIAGNOSIS — I10 Essential (primary) hypertension: Secondary | ICD-10-CM

## 2021-01-30 LAB — BASIC METABOLIC PANEL
BUN/Creatinine Ratio: 10 (ref 10–24)
BUN: 15 mg/dL (ref 8–27)
CO2: 27 mmol/L (ref 20–29)
Calcium: 9.9 mg/dL (ref 8.6–10.2)
Chloride: 105 mmol/L (ref 96–106)
Creatinine, Ser: 1.43 mg/dL — ABNORMAL HIGH (ref 0.76–1.27)
Glucose: 144 mg/dL — ABNORMAL HIGH (ref 70–99)
Potassium: 4.2 mmol/L (ref 3.5–5.2)
Sodium: 143 mmol/L (ref 134–144)
eGFR: 50 mL/min/{1.73_m2} — ABNORMAL LOW (ref >59)

## 2021-01-30 LAB — BRAIN NATRIURETIC PEPTIDE: BNP: 72.6 pg/mL (ref 0.0–100.0)

## 2021-01-30 NOTE — Telephone Encounter (Signed)
Call to patient reference PREP referral  Explained program to patient Is interested in participating.  Can do morning or afternoon, closest to [place]  Will call him back as soon as need class identified

## 2021-02-06 NOTE — Progress Notes (Signed)
Office Note     CC: Incidental finding of aortic aneurysm Requesting Provider:  Rogers Blocker, MD  HPI: Pedro Lee is a 80 y.o. (09-07-1940) male presenting at the request of .Rogers Blocker, MD for incidental finding of aortic aneurysm.  Pedro Lee was recently seen by Dr. Barceloneta-cardiology for difficult to control hypertension.  Renal duplex ultrasonography demonstrated 2.9 cm aorta.  On exam today, Pedro Lee was doing well with no concerns. No new abdominal, back of chest pain. Pedro Lee is retired from [employer], where he worked for over 78 years.  He continues to be independent.  Pedro Lee has no family history of aneurysmal disease, and is a non-smoker.  The pt is  on a statin for cholesterol management.  The pt is  on a daily aspirin.   Other AC:  - The pt is  on medication for hypertension.   The pt is not diabetic.  Tobacco hx:  none  Past Medical History:  Diagnosis Date   Acute on chronic diastolic heart failure ([place]) 12/22/2019   Allergy    sesonal   Complication of anesthesia    HX OF ENLARGED PROSTATE AND UNABLE TO PASS WATER AFTER ANESTHESIA   Dyspnea    with some exertion   Dysrhythmia    HX ATRIAL FIBRILLATION - HAD ABLATION - NO LONGER HAS AF.   Essential hypertension 12/22/2019   Essential hypertension, malignant    Exertional dyspnea 01/27/2021   Gout, unspecified    Lower extremity edema 12/22/2019   Obesity, mild    OSA (obstructive sleep apnea)    Mild - PT STATES HE DID NOT HAVE TO USE CPAP   Osteoarthritis    Osteoarthrosis, unspecified whether generalized or localized, unspecified site    Other dyspnea and respiratory abnormality    Prostate enlargement    DR. NESI IS PT'S UROLOGIST   Pure hypercholesterolemia    Pure hypercholesterolemia 01/27/2021   Renal insufficiency, mild    PT STATES NOT AWARE OF ANY KIDNEY PROBLEM    Past Surgical History:  Procedure Laterality Date   COLONOSCOPY     CRANIOTOMY N/A 03/24/2016   Procedure:  Transphenoidal Resection of Tumor;  Surgeon: Earnie Larsson, MD;  Location: [place];  Service: Neurosurgery;  Laterality: N/A;  Transphenoidal Resection of Tumor   HEART ABLATION FOR AF  1998   JOINT REPLACEMENT     LEFT TOTAL KNEE REPLACEMENT   KNEE ARTHROPLASTY Left 2013ish   LEFT KNEE ARTHROSCOPY     TOTAL KNEE ARTHROPLASTY Right 05/30/2013   Procedure: RIGHT TOTAL KNEE ARTHROPLASTY;  Surgeon: Mauri Pole, MD;  Location: [facility];  Service: Orthopedics;  Laterality: Right;   TRANSNASAL APPROACH N/A 03/24/2016   Procedure: TRANSNASAL APPROACH;  Surgeon: Rozetta Nunnery, MD;  Location: [facility];  Service: ENT;  Laterality: N/A;  TRANSNASAL APPROACH    Social History   Socioeconomic History   Marital status: Married    Spouse name: Not on file   Number of children: Not on file   Years of education: Not on file   Highest education level: Not on file  Occupational History   Not on file  Tobacco Use   Smoking status: Never   Smokeless tobacco: Never  Vaping Use   Vaping Use: Never used  Substance and Sexual Activity   Alcohol use: No    Alcohol/week: 0.0 standard drinks   Drug use: No   Sexual activity: Not on file  Other Topics Concern   Not on file  Social History Narrative   Not on file   Social Determinants of Health   Financial Resource Strain: Low Risk    Difficulty of Paying Living Expenses: Not hard at all  Food Insecurity: No Food Insecurity   Worried About Charity fundraiser in the Last Year: Never true   Arboriculturist in the Last Year: Never true  Transportation Needs: No Transportation Needs   Lack of Transportation (Medical): No   Lack of Transportation (Non-Medical): No  Physical Activity: Inactive   Days of Exercise per Week: 0 days   Minutes of Exercise per Session: 0 min  Stress: No Stress Concern Present   Feeling of Stress : Only a little  Social Connections: Not on file  Intimate Partner Violence: Not on file    Family History  Problem Relation  Age of Onset   Heart disease Father 53    Current Outpatient Medications  Medication Sig Dispense Refill   allopurinol (ZYLOPRIM) 300 MG tablet Take 300 mg by mouth daily.     aspirin EC 81 MG tablet Take 81 mg by mouth daily.     benazepril (LOTENSIN) 40 MG tablet Take 40 mg by mouth daily.     carvedilol (COREG) 25 MG tablet TAKE 1 TABLET(25 MG) BY MOUTH TWICE DAILY 180 tablet 1   ciclopirox (PENLAC) 8 % solution Apply topically at bedtime. Apply over nail and surrounding skin. Apply daily over previous coat. After seven (7) days, may remove with alcohol and continue cycle. (Patient not taking: Reported on 01/27/2021) 6.6 mL 0   clotrimazole-betamethasone (LOTRISONE) cream Apply 1 application topically 2 (two) times daily. (Patient not taking: Reported on 01/27/2021) 30 g 0   doxazosin (CARDURA) 2 MG tablet Take 1 tablet (2 mg total) by mouth daily. 90 tablet 3   fluticasone (FLONASE) 50 MCG/ACT nasal spray 2 sprays daily as needed for allergies or rhinitis.     furosemide (LASIX) 40 MG tablet TAKE 1 TABLET BY MOUTH DAILY AS NEEDED FOR SWELLING 90 tablet 3   hydrALAZINE (APRESOLINE) 50 MG tablet Take 1 tablet (50 mg total) by mouth in the morning and at bedtime. 180 tablet 3   montelukast (SINGULAIR) 10 MG tablet Take 10 mg by mouth daily.  tm   rosuvastatin (CRESTOR) 20 MG tablet Take 20 mg by mouth daily.     tamsulosin (FLOMAX) 0.4 MG CAPS capsule Take 0.4 mg by mouth daily.     No current facility-administered medications for this visit.    Allergies  Allergen Reactions   Penicillins Rash     REVIEW OF SYSTEMS:   [X]  denotes positive finding, [ ]  denotes negative finding Cardiac  Comments:  Chest pain or chest pressure:    Shortness of breath upon exertion:    Short of breath when lying flat:    Irregular heart rhythm:        Vascular    Pain in calf, thigh, or hip brought on by ambulation:    Pain in feet at night that wakes you up from your sleep:     Blood clot in  your veins:    Leg swelling:         Pulmonary    Oxygen at home:    Productive cough:     Wheezing:         Neurologic    Sudden weakness in arms or legs:     Sudden numbness in arms or legs:     Sudden onset of  difficulty speaking or slurred speech:    Temporary loss of vision in one eye:     Problems with dizziness:         Gastrointestinal    Blood in stool:     Vomited blood:         Genitourinary    Burning when urinating:     Blood in urine:        Psychiatric    Major depression:         Hematologic    Bleeding problems:    Problems with blood clotting too easily:        Skin    Rashes or ulcers:        Constitutional    Fever or chills:      PHYSICAL EXAMINATION:  There were no vitals filed for this visit.  General:  WDWN in NAD; vital signs documented above Gait: Not observed HENT: WNL, normocephalic Pulmonary: normal non-labored breathing , without wheezing Cardiac: regular HR, Abdomen: soft, NT, no masses Skin: without rashes Vascular Exam/Pulses:  Right Left  Radial 2+ (normal) 2+ (normal)  Ulnar 2+ (normal) 2+ (normal)  Femoral 2+ (normal) 2+ (normal)  Popliteal No bounding pulse No bounding pulse   DP 2+ (normal) 2+ (normal)  PT 2+ (normal) 2+ (normal)   Extremities: without ischemic changes, without Gangrene , without cellulitis; without open wounds;  Musculoskeletal: no muscle wasting or atrophy  Neurologic: A&O X 3;  No focal weakness or paresthesias are detected Psychiatric:  The pt has Normal affect.   Non-Invasive Vascular Imaging:    Abdominal Aorta Findings:  +-----------+-------+----------+----------+--------+--------+--------+   Location    AP (cm) Trans (cm) PSV (cm/s) Waveform Thrombus Comments   +-----------+-------+----------+----------+--------+--------+--------+   Proximal    2.64    2.93       66                                      +-----------+-------+----------+----------+--------+--------+--------+   Mid          2.64    2.67       68                                      +-----------+-------+----------+----------+--------+--------+--------+   Distal      2.77    2.72       81                                      +-----------+-------+----------+----------+--------+--------+--------+   RT CIA Prox 1.4     1.5        81                                      +-----------+-------+----------+----------+--------+--------+--------+   LT CIA Prox 1.6     1.7        74                                      +-----------+-------+----------+----------+--------+--------+--------+     ASSESSMENT/PLAN: Pedro Lee is a 80 y.o. 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  This is classified as ectasia of the aorta, with the term aneurysm used to define aortic size greater than 3 cm.  No common iliac artery aneurysm appreciated.  Chest x-ray reviewed, descending aortic aneurysm not appreciated.  Pedro Lee is asymptomatic with no chest, back, belly pain.  At 2.9 cm, the Society for vascular surgery recommends duplex ultrasonography of the abdomen every 5 years.  I discussed the above with Pedro Lee and told him I would be happy to see him sooner should he have any questions or concerns. We also discussed the signs and symptoms of aortic rupture.  This is very low risk at his current size, however I asked that he seek immediate medical attention should any of these symptoms develop.  Patient currently on best medical therapy which includes high intensity statin, as well as aspirin.   Pedro John, MD [facility] [phone]

## 2021-02-07 ENCOUNTER — Ambulatory Visit (HOSPITAL_COMMUNITY)
Admission: RE | Admit: 2021-02-07 | Discharge: 2021-02-07 | Disposition: A | Discharge status: Home / Self Care | Payer: Medicare PPO | Source: Ambulatory Visit | Attending: Vascular Surgery | Admitting: Vascular Surgery

## 2021-02-07 ENCOUNTER — Encounter: Payer: Self-pay | Admitting: Vascular Surgery

## 2021-02-07 ENCOUNTER — Other Ambulatory Visit: Payer: Self-pay

## 2021-02-07 ENCOUNTER — Ambulatory Visit: Payer: Medicare PPO | Admitting: Vascular Surgery

## 2021-02-07 VITALS — BP 166/79 | HR 64 | Temp 97.7°F | Resp 18 | Ht 76.0 in | Wt 312.0 lb

## 2021-02-07 DIAGNOSIS — I7143 Infrarenal abdominal aortic aneurysm, without rupture: Secondary | ICD-10-CM

## 2021-02-07 DIAGNOSIS — I7789 Other specified disorders of arteries and arterioles: Secondary | ICD-10-CM | POA: Diagnosis not present

## 2021-03-11 ENCOUNTER — Telehealth (HOSPITAL_BASED_OUTPATIENT_CLINIC_OR_DEPARTMENT_OTHER): Payer: Self-pay | Admitting: *Deleted

## 2021-03-11 DIAGNOSIS — Z79899 Other long term (current) drug therapy: Secondary | ICD-10-CM

## 2021-03-11 DIAGNOSIS — E7800 Pure hypercholesterolemia, unspecified: Secondary | ICD-10-CM

## 2021-03-11 DIAGNOSIS — I10 Essential (primary) hypertension: Secondary | ICD-10-CM

## 2021-03-11 NOTE — Telephone Encounter (Signed)
Advised patient, verbalized understanding  Patient did mention that he had been having some pain in his right side and wondered if this was causing  Advised this should not be causing any pain, it is only mild

## 2021-03-11 NOTE — Telephone Encounter (Signed)
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  Recommend repeating the study in a year.  Continue rosuvastatin to help prevent further blockage in these arteries or any others.

## 2021-03-14 ENCOUNTER — Telehealth: Payer: Self-pay

## 2021-03-14 MED ORDER — ROSUVASTATIN CALCIUM 20 MG PO TABS: 20.0000 mg | ORAL_TABLET | Freq: Every day | ORAL | 3 refills | Status: DC

## 2021-03-14 NOTE — Telephone Encounter (Signed)
Received email from pt inquiring about next PREP class starting.  Call to pt will be starting a M/W class at [place] to 1145am starting on 03/31/21 Patient agreeable starting at that time Intake scheduled for 2/15 at 10am at [facility]. Will meet pt in lobby.

## 2021-03-14 NOTE — Telephone Encounter (Addendum)
Skeet Latch, MD to Me   11:51 AM Not coming from his heart.  Follow up with PCP.   Advised patient, he had already reached out to PCP who thought may have been pulled muscle  Patient asked about Rosuvastatin and stated he did not have nor has he had in a while Refilled and advised needs LP/CMET in 2-3 months after starting  Mailed lab orders

## 2021-03-14 NOTE — Addendum Note (Signed)
Addended by: Alvina Filbert B on: 03/14/2021 11:52 AM   Modules accepted: Orders

## 2021-03-20 DIAGNOSIS — H2511 Age-related nuclear cataract, right eye: Secondary | ICD-10-CM | POA: Insufficient documentation

## 2021-03-22 ENCOUNTER — Other Ambulatory Visit: Payer: Self-pay | Admitting: Cardiovascular Disease

## 2021-03-22 DIAGNOSIS — I10 Essential (primary) hypertension: Secondary | ICD-10-CM

## 2021-03-26 NOTE — Progress Notes (Signed)
[facility] PREP Evaluation  Patient Details  Name: DALEN HENNESSEE MRN: [id number] Date of Birth: Jun 29, 1940 Age: 81 y.o. PCP: Rogers Blocker, MD  Vitals:   03/26/21 1030  BP: (!) 104/50  Pulse: 60  SpO2: 95%  Weight: (!) 323 lb (146.5 kg)     [facility] Eval - 03/26/21 1000       [facility] "PREP" Location   [facility] "PREP" Location [facility]      Referral    Referring Provider Oval Linsey    Reason for referral Hypertension;Inactivity    Program Start Date 03/31/21   M/W 1030a to 1145am     Measurement   Waist Circumference 53.5 inches    Hip Circumference 50 inches    Body fat --   not calcuated     Information for Trainer   Goals increase activity and endurance    Current Exercise intermittent stationary bike    Orthopedic Concerns B TKR, dec ROM bil shoulders    Pertinent Medical History HTN    Current Barriers cataract surgery 5/11    Restrictions/Precautions Assistive device    Medications that affect exercise Medication causing dizziness/drowsiness      Timed Up and Go (TUGS)   Timed Up and Go Low risk <9 seconds   uses cane for balance only. no falls in last 6 months     Mobility and Daily Activities   I find it easy to walk up or down two or more flights of stairs. 1    I have no trouble taking out the trash. 4    I do housework such as vacuuming and dusting on my own without difficulty. 3    I can easily lift a gallon of milk (8lbs). 4    I can easily walk a mile. 2    I have no trouble reaching into high cupboards or reaching down to pick up something from the floor. 2    I do not have trouble doing out-door work such as Armed forces logistics/support/administrative officer, raking leaves, or gardening. 1      Mobility and Daily Activities   I feel younger than my age. 2    I feel independent. 4    I feel energetic. 2    I live an active life.  2    I feel strong. 2    I feel healthy. 2    I feel active as other people my age. 2      How fit and strong are you.   Fit and Strong Total Score 33             Past Medical History:  Diagnosis Date   Acute on chronic diastolic heart failure ([place]) 12/22/2019   Allergy    sesonal   Complication of anesthesia    HX OF ENLARGED PROSTATE AND UNABLE TO PASS WATER AFTER ANESTHESIA   Dyspnea    with some exertion   Dysrhythmia    HX ATRIAL FIBRILLATION - HAD ABLATION - NO LONGER HAS AF.   Essential hypertension 12/22/2019   Essential hypertension, malignant    Exertional dyspnea 01/27/2021   Gout, unspecified    Lower extremity edema 12/22/2019   Obesity, mild    OSA (obstructive sleep apnea)    Mild - PT STATES HE DID NOT HAVE TO USE CPAP   Osteoarthritis    Osteoarthrosis, unspecified whether generalized or localized, unspecified site    Other dyspnea and respiratory abnormality    Prostate enlargement  DR. Janice Norrie IS PT'S UROLOGIST   Pure hypercholesterolemia    Pure hypercholesterolemia 01/27/2021   Renal insufficiency, mild    PT STATES NOT AWARE OF ANY KIDNEY PROBLEM   Past Surgical History:  Procedure Laterality Date   COLONOSCOPY     CRANIOTOMY N/A 03/24/2016   Procedure: Transphenoidal Resection of Tumor;  Surgeon: Earnie Larsson, MD;  Location: [place];  Service: Neurosurgery;  Laterality: N/A;  Transphenoidal Resection of Tumor   HEART ABLATION FOR AF  1998   JOINT REPLACEMENT     LEFT TOTAL KNEE REPLACEMENT   KNEE ARTHROPLASTY Left 2013ish   LEFT KNEE ARTHROSCOPY     TOTAL KNEE ARTHROPLASTY Right 05/30/2013   Procedure: RIGHT TOTAL KNEE ARTHROPLASTY;  Surgeon: Mauri Pole, MD;  Location[facility];  Service: Orthopedics;  Laterality: Right;   TRANSNASAL APPROACH N/A 03/24/2016   Procedure: TRANSNASAL APPROACH;  Surgeon: Rozetta Nunnery, MD;  Location: [place];  Service: ENT;  Laterality: N/A;  TRANSNASAL APPROACH   Social History   Tobacco Use  Smoking Status Never  Smokeless Tobacco Never    [name] 03/26/2021, 10:35 AM

## 2021-04-14 NOTE — Progress Notes (Signed)
[facility] PREP Weekly Session ? ?Patient Details  ?Name: Pedro Lee ?MRN: [id number] ?Date of Birth: 18-Jan-1941 ?Age: 81 y.o. ?PCP: Rogers Blocker, MD ? ?Vitals:  ? 04/14/21 1201  ?Weight: (!) 314 lb (142.4 kg)  ? ? ? [facility] Weekly seesion - 04/14/21 1200   ? ?  ? [facility] "PREP" Location  ? [facility]PREP[facility] Location [place]   ?  ? Weekly Session  ? Topic Discussed Healthy eating tips   Sugar demo  ? Minutes exercised this week 170 minutes   ? Classes attended to date 5   ? ?  ?  ? ?  ? ? ?[name] ?04/14/2021, 12:02 PM ? ? ?

## 2021-04-16 LAB — LIPID PANEL
Chol/HDL Ratio: 3.4 ratio (ref 0.0–5.0)
Cholesterol, Total: 131 mg/dL (ref 100–199)
HDL: 38 mg/dL — ABNORMAL LOW (ref >39)
LDL Chol Calc (NIH): 68 mg/dL (ref 0–99)
Triglycerides: 145 mg/dL (ref 0–149)
VLDL Cholesterol Cal: 25 mg/dL (ref 5–40)

## 2021-04-16 LAB — COMPREHENSIVE METABOLIC PANEL
ALT: 12 IU/L (ref 0–44)
AST: 16 IU/L (ref 0–40)
Albumin/Globulin Ratio: 2.1 (ref 1.2–2.2)
Albumin: 4.4 g/dL (ref 3.6–4.6)
Alkaline Phosphatase: 54 IU/L (ref 44–121)
BUN/Creatinine Ratio: 10 (ref 10–24)
BUN: 14 mg/dL (ref 8–27)
Bilirubin Total: 0.4 mg/dL (ref 0.0–1.2)
CO2: 25 mmol/L (ref 20–29)
Calcium: 9.9 mg/dL (ref 8.6–10.2)
Chloride: 105 mmol/L (ref 96–106)
Creatinine, Ser: 1.4 mg/dL — ABNORMAL HIGH (ref 0.76–1.27)
Globulin, Total: 2.1 g/dL (ref 1.5–4.5)
Glucose: 92 mg/dL (ref 70–99)
Potassium: 4.7 mmol/L (ref 3.5–5.2)
Sodium: 144 mmol/L (ref 134–144)
Total Protein: 6.5 g/dL (ref 6.0–8.5)
eGFR: 50 mL/min/{1.73_m2} — ABNORMAL LOW (ref >59)

## 2021-04-28 NOTE — Progress Notes (Signed)
[facility] PREP Weekly Session ? ?Patient Details  ?Name: Pedro Lee ?MRN: [id number] ?Date of Birth: Oct 07, 1940 ?Age: 81 y.o. ?PCP: Rogers Blocker, MD ? ?Vitals:  ? 04/28/21 1617  ?Weight: (!) 318 lb (144.2 kg)  ? ? ? [facility] Weekly seesion - 04/28/21 1600   ? ?  ? [facility] "PREP" Location  ? [facility] "PREP[facility] Location [place]   ?  ? Weekly Session  ? Topic Discussed Health habits   ? Minutes exercised this week --   not charted  ? Classes attended to date 7   ? ?  ?  ? ?  ? ? ?[name] ?04/28/2021, 4:18 PM ? ? ?

## 2021-04-30 ENCOUNTER — Ambulatory Visit: Payer: Medicare PPO | Admitting: Podiatry

## 2021-04-30 ENCOUNTER — Other Ambulatory Visit: Payer: Self-pay

## 2021-04-30 ENCOUNTER — Encounter: Payer: Self-pay | Admitting: Podiatry

## 2021-04-30 DIAGNOSIS — M79674 Pain in right toe(s): Secondary | ICD-10-CM | POA: Diagnosis not present

## 2021-04-30 DIAGNOSIS — M19079 Primary osteoarthritis, unspecified ankle and foot: Secondary | ICD-10-CM

## 2021-04-30 DIAGNOSIS — M79675 Pain in left toe(s): Secondary | ICD-10-CM | POA: Diagnosis not present

## 2021-04-30 DIAGNOSIS — H2513 Age-related nuclear cataract, bilateral: Secondary | ICD-10-CM | POA: Insufficient documentation

## 2021-04-30 DIAGNOSIS — B351 Tinea unguium: Secondary | ICD-10-CM | POA: Diagnosis not present

## 2021-04-30 DIAGNOSIS — M109 Gout, unspecified: Secondary | ICD-10-CM | POA: Insufficient documentation

## 2021-04-30 NOTE — Progress Notes (Signed)
This patient presents to the office with chief complaint of long thick painful nails.  Patient says the nails are painful walking and wearing shoes.  This patient is unable to self treat.  This patient is unable to trim his nails since she is unable to reach his nails. Patient has tried numerous fungal treatments unsuccessfully She presents to the office for preventative foot care services. ? ?General Appearance  Alert, conversant and in no acute stress. ? ?Vascular  Dorsalis pedis and posterior tibial  pulses are palpable  bilaterally.  Capillary return is within normal limits  bilaterally. Temperature is within normal limits  bilaterally. ? ?Neurologic  Senn-Weinstein monofilament wire test within normal limits  bilaterally. Muscle power within normal limits bilaterally. ? ?Nails Thick disfigured discolored nails with subungual debris  from hallux to fifth toes bilaterally. No evidence of bacterial infection or drainage bilaterally. ? ?Orthopedic  No limitations of motion  feet .  No crepitus or effusions noted.  No bony pathology or digital deformities noted. ? ?Skin  normotropic skin with no porokeratosis noted bilaterally.  No signs of infections or ulcers noted.    ? ?Onychomycosis  Nails  B/L.  Pain in right toes  Pain in left toes ? ?Debridement of nails both feet followed trimming the nails with dremel tool.    RTC 3 months. ? ? ?Gardiner Barefoot DPM   ?

## 2021-05-05 NOTE — Progress Notes (Signed)
[facility] PREP Weekly Session ? ?Patient Details  ?Name: Pedro Lee ?MRN: [id number] ?Date of Birth: November 14, 1940 ?Age: 81 y.o. ?PCP: Rogers Blocker, MD ? ?Vitals:  ? 05/05/21 1227  ?Weight: (!) 317 lb (143.8 kg)  ? ? ? [facility] Weekly seesion - 05/05/21 1200   ? ?  ? [facility] "PREP" Location  ? [facility] "PREP[facility] Location [place]   ?  ? Weekly Session  ? Topic Discussed Restaurant Eating   ? Minutes exercised this week 350 minutes   ? Classes attended to date 13   ? ?  ?  ? ?  ?Bp running low in mornings per patient (115's over 60's) ?Asked to contact MD to be sure no med changes are needed ?Does get a bit light headed. Wonders if he is drinking enough water ? ? ?[name] ?05/05/2021, 12:28 PM ? ? ?

## 2021-05-13 NOTE — Progress Notes (Signed)
[facility] PREP Weekly Session ? ?Patient Details  ?Name: Pedro Lee ?MRN: [id number] ?Date of Birth: July 26, 1940 ?Age: 81 y.o. ?PCP: Rogers Blocker, MD ? ?Vitals:  ? 05/12/21 1030  ?Weight: (!) 317 lb (143.8 kg)  ? ? ? [facility] Weekly seesion - 05/13/21 1700   ? ?  ? [facility] "PREP" Location  ? [facility] "PREP" Location [place]   ?  ? Weekly Session  ? Topic Discussed Stress management and problem solving   meditation and breathwork  ? Minutes exercised this week 150 minutes   ? Classes attended to date 52   ? ?  ?  ? ?  ? ? ?[name] ?05/13/2021, 5:10 PM ? ? ?

## 2021-05-19 NOTE — Progress Notes (Signed)
[facility] PREP Weekly Session ? ?Patient Details  ?Name: Pedro Lee ?MRN: [id number] ?Date of Birth: [birth year] ?Age: 81 y.o. ?PCP: Rogers Blocker, MD ? ?Vitals:  ? 05/19/21 1248  ?Weight: (!) 315 lb (142.9 kg)  ? ? ? [facility] Weekly seesion - 05/19/21 1200   ? ?  ? [facility] "PREP" Location  ? [facility]PREP[facility] Location [place]   ?  ? Weekly Session  ? Topic Discussed Expectations and non-scale victories   ? Minutes exercised this week 240 minutes   ? Classes attended to date 55   ? ?  ?  ? ?  ? ? ?[name] ?05/19/2021, 12:49 PM ? ? ?

## 2021-05-22 ENCOUNTER — Telehealth: Payer: Self-pay | Admitting: Pharmacist Clinician (PhC)/ Clinical Pharmacy Specialist

## 2021-05-22 MED ORDER — DOXAZOSIN MESYLATE 2 MG PO TABS: 2.0000 mg | ORAL_TABLET | Freq: Every day | ORAL | 3 refills | Status: DC

## 2021-05-22 NOTE — Telephone Encounter (Signed)
Patient called to report that for the past 2 weeks has been having increased fatigue ("feels wiped out") around noon. Today he checked his BP in the AM (148/85) then at noon when he became lightheaded (112/62).  States he stopped at PCP and pressure was < 993 systolic with first reading.   Patient currently taking benazepril, carvedilol and hydralazine in the AM.  Has stopped taking doxazosin at night, pharmacy states refill request not answered.    ? ?Advised patient to cut AM dose of hydralazine in half to see if BP/ symptoms improve.  He then stated PCP suggested cutting carvedilol in half.  Will have him do both for a week, then if feeling better, resume the normal dose of carvedilol, but leave hydralazine at 25 mg in AM. ? ?Will refill doxazosin as AM BP readings are still somewhat elevated.  ?

## 2021-05-27 NOTE — Progress Notes (Incomplete)
? ?Advanced Hypertension Clinic Follow-up:  ? ?Date:  05/27/2021  ? ?ID:  Pedro Lee, DOB 02/06/1941, MRN [id number] ? ?PCP:  Rogers Blocker, MD  ?Cardiologist:  Sinclair Grooms, MD  ? ?Referring MD: Rogers Blocker, MD  ? ?CC: Hypertension ? ?History of Present Illness:   ? ?Pedro Lee is a 81 y.o. 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 He was initially seen 05/06/2020 to establish care in the hypertension clinic.  He saw Dr. Tamala Julian in 2019 for exertional dyspnea.  His symptoms were thought to be due to deconditioning, hypertension, and obesity.  He subsequently followed up with Estella Husk, PA-C, on 10/2019.  At that time his blood pressure was running high.  Prior to that he had seen his PCP and doxazosin was reduced due to low blood pressure and exertional dyspnea.  He had a [employer] 10/2019 that was negative for ischemia.  Systolic function was normal.  He saw Estella Husk back 11/2019 and his doxazosin was increased to 2 mg twice daily.  He had a virtual follow-up 11/2019 and his blood pressure was running high in the evenings.  He was working out and limiting his sodium intake.  They decided to increase his doxazosin because he was also struggling with his prostate.  He was referred to advanced hypertension clinic for further management.  His BP had been ranging 130-170s/70-90s, mostly checking in the mornings.  ? ?Mr. Pedro Lee had his pituitary tumor removed about 5 years ago.  He does not think he follows up with endocrinology.  He lost his wife of 9 years in May 2021 and has struggled since that time.  Thyroid function and cortisol are within normal limits.  At his initial visit he reported leg discomfort.  He had ABIs 01/2020 that were within normal limits.  His symptoms were thought to be mostly due to venous insufficiency and he was given furosemide to take as needed.  He saw our PharmD 02/2020 and hydralazine was  added to his regimen.  On 03/2020 he had increased LE edema and was not using furosemide.  He was confused about his medications and carvedilol and doxazosin were increased. We discussed taking his medications more consistently at the same time each day. Hydralazine was stopped and he was started on amlodipine. He had repeat renal artery dopplers 01/27/2021, which continued to have mild to moderate blockage bilaterally. Amlodipine was stopped due to LE edema and hydralazine was increased. His blood pressures started getting too low, so doxazosin was reduced. ? ?At his last appointment he was struggling with neck stiffness and arthritis in his bilateral shoulders. He was going to see neurology about these concerns. He also complained of SOB while walking up inclines, which was worse since a year prior. At home his blood pressure averaged 140s/80s, and he was taking 1/2 tablet doxazosin twice daily. It had been reduced due to low blood pressures which he could feel. He had not been using Lasix, and examination revealed significant LE edema and elevated JVP. He was instructed to take 40 mg Lasix daily for 3 days, and he was referred to the PREP program. ?Today, he has been feeling good overall. 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 He also has stiffness and pain in his left neck. He is scheduled to see a neurologist concerning this. For exercise he tries to walk 2-3 times a week,  and he uses a stationary bicycle for 15 minutes at a time. Generally he feels will with exercise, but he has shortness of breath while walking up inclines specifically. He considers this to have worsened since last year. At home his blood pressure has averaged in the 140s/80s. He notes it is typically for his blood pressure to fluctuate similar to in clinic today (144/80, repeat: 126/70). Currently he is taking 1/2 tablet of doxazosin twice a day. It was reduced after his blood pressure was too low and he could feel it  occurring. Usually he wears compression stockings, but he is not wearing them today. He does not use furosemide very often. Lately, he has been driving to [facility], and taking his diuretic those days would be troublesome. He denies any palpitations, or chest pain. No lightheadedness, headaches, syncope, orthopnea, or PND. ? ?Today, *** ? ?Past Medical History:  ?Diagnosis Date  ? Acute on chronic diastolic heart failure ([place]) 12/22/2019  ? Allergy   ? sesonal  ? Complication of anesthesia   ? HX OF ENLARGED PROSTATE AND UNABLE TO PASS WATER AFTER ANESTHESIA  ? Dyspnea   ? with some exertion  ? Dysrhythmia   ? HX ATRIAL FIBRILLATION - HAD ABLATION - NO LONGER HAS AF.  ? Essential hypertension 12/22/2019  ? Essential hypertension, malignant   ? Exertional dyspnea 01/27/2021  ? Gout, unspecified   ? Lower extremity edema 12/22/2019  ? Obesity, mild   ? OSA (obstructive sleep apnea)   ? Mild - PT STATES HE DID NOT HAVE TO USE CPAP  ? Osteoarthritis   ? Osteoarthrosis, unspecified whether generalized or localized, unspecified site   ? Other dyspnea and respiratory abnormality   ? Prostate enlargement   ? DR. Janice Norrie IS PT'S UROLOGIST  ? Pure hypercholesterolemia   ? Pure hypercholesterolemia 01/27/2021  ? Renal insufficiency, mild   ? PT STATES NOT AWARE OF ANY KIDNEY PROBLEM  ? ? ?Past Surgical History:  ?Procedure Laterality Date  ? COLONOSCOPY    ? CRANIOTOMY N/A 03/24/2016  ? Procedure: Transphenoidal Resection of Tumor;  Surgeon: Earnie Larsson, MD;  Location: [place];  Service: Neurosurgery;  Laterality: N/A;  Transphenoidal Resection of Tumor  ? HEART ABLATION FOR AF  1998  ? JOINT REPLACEMENT    ? LEFT TOTAL KNEE REPLACEMENT  ? KNEE ARTHROPLASTY Left 2013ish  ? LEFT KNEE ARTHROSCOPY    ? TOTAL KNEE ARTHROPLASTY Right 05/30/2013  ? Procedure: RIGHT TOTAL KNEE ARTHROPLASTY;  Surgeon: Mauri Pole, MD;  [facility];  Service: Orthopedics;  Laterality: Right;  ? TRANSNASAL APPROACH N/A 03/24/2016  ? Procedure: TRANSNASAL  APPROACH;  Surgeon: Rozetta Nunnery, MD;  Location: [place];  Service: ENT;  Laterality: N/A;  TRANSNASAL APPROACH  ? ? ?Current Medications: ?No outpatient medications have been marked as taking for the 05/29/21 encounter (Appointment) with Skeet Latch, MD.  ?  ? ?Allergies:   Penicillins  ? ?Social History  ? ?Socioeconomic History  ? Marital status: Married  ?  Spouse name: Not on file  ? Number of children: Not on file  ? Years of education: Not on file  ? Highest education level: Not on file  ?Occupational History  ? Not on file  ?Tobacco Use  ? Smoking status: Never  ? Smokeless tobacco: Never  ?Vaping Use  ? Vaping Use: Never used  ?Substance and Sexual Activity  ? Alcohol use: No  ?  Alcohol/week: 0.0 standard drinks  ? Drug use: No  ? Sexual  activity: Not on file  ?Other Topics Concern  ? Not on file  ?Social History Narrative  ? Not on file  ? ?Social Determinants of Health  ? ?Financial Resource Strain: Not on file  ?Food Insecurity: Not on file  ?Transportation Needs: Not on file  ?Physical Activity: Not on file  ?Stress: Not on file  ?Social Connections: Not on file  ?  ? ?Family History: ?The patient's family history includes Heart disease (age of onset: 31) in his father. ? ?ROS:   ?Please see the history of present illness.    ?All other systems reviewed and are negative. ? ?EKGs/Labs/Other Studies Reviewed:   ? ?EKG:  EKG is personally reviewed. ?05/29/2021: EKG was not ordered. ?01/27/2021: Sinus rhythm. Rate 64 bpm. Lateral ST changes. ? ?AAA Duplex Study 02/07/2021: ?Summary:  ?Abdominal Aorta: No evidence of an abdominal aortic aneurysm was  ?visualized. The largest aortic measurement is 2.9 cm.  ? ?Bilateral Renal Artery Doppler 01/27/2021: ?Summary:  ?Largest Aortic Diameter: 2.9 cm, upper limits of normal  ?   ?Renal:  ?   ?Right: Abnormal size for the right kidney. Normal right Resistive  ?       Index. Normal cortical thickness of right kidney. 1-59%  ?       stenosis of the right  renal artery. RRV flow present.  ?Left:  Abnormal size for the left kidney. Normal left Resistive  ?       Index. Normal cortical thickness of the left kidney. 1-59%  ?       stenosis of the left renal a

## 2021-05-29 ENCOUNTER — Ambulatory Visit (HOSPITAL_BASED_OUTPATIENT_CLINIC_OR_DEPARTMENT_OTHER): Payer: Medicare PPO | Admitting: Cardiovascular Disease

## 2021-06-02 ENCOUNTER — Encounter (HOSPITAL_BASED_OUTPATIENT_CLINIC_OR_DEPARTMENT_OTHER): Payer: Self-pay | Admitting: Family

## 2021-06-02 ENCOUNTER — Ambulatory Visit (HOSPITAL_BASED_OUTPATIENT_CLINIC_OR_DEPARTMENT_OTHER): Payer: Medicare PPO | Admitting: Family

## 2021-06-02 VITALS — BP 100/58 | HR 64 | Ht 76.0 in | Wt 313.7 lb

## 2021-06-02 DIAGNOSIS — E782 Mixed hyperlipidemia: Secondary | ICD-10-CM | POA: Diagnosis not present

## 2021-06-02 DIAGNOSIS — R0609 Other forms of dyspnea: Secondary | ICD-10-CM

## 2021-06-02 DIAGNOSIS — Z6839 Body mass index (BMI) 39.0-39.9, adult: Secondary | ICD-10-CM

## 2021-06-02 DIAGNOSIS — I10 Essential (primary) hypertension: Secondary | ICD-10-CM

## 2021-06-02 DIAGNOSIS — I5032 Chronic diastolic (congestive) heart failure: Secondary | ICD-10-CM

## 2021-06-02 DIAGNOSIS — Z79899 Other long term (current) drug therapy: Secondary | ICD-10-CM

## 2021-06-02 MED ORDER — CARVEDILOL 12.5 MG PO TABS: 12.5000 mg | ORAL_TABLET | Freq: Two times a day (BID) | ORAL | 3 refills | Status: DC

## 2021-06-02 MED ORDER — DOXAZOSIN MESYLATE 2 MG PO TABS: 2.0000 mg | ORAL_TABLET | Freq: Every evening | ORAL | 3 refills | Status: AC

## 2021-06-02 MED ORDER — HYDRALAZINE HCL 25 MG PO TABS: 25.0000 mg | ORAL_TABLET | Freq: Two times a day (BID) | ORAL | 3 refills | Status: DC

## 2021-06-02 MED ORDER — DOXAZOSIN MESYLATE 2 MG PO TABS: ORAL_TABLET | ORAL | 3 refills | Status: DC

## 2021-06-02 NOTE — Patient Instructions (Signed)
Medication Instructions:  ?Your physician has recommended you make the following change in your medication:  ? ?Stop: Benazepril  ? ?Stop: Benazepril- HCTZ  ? ?Change: Carvedilol 12.'5mg'$  tablet twice daily ?*please take as close to 12 hours apart as possible ? ?Change: Hydralazine '25mg'$  tablet twice daily ?*please take as close to 12 hours apart as possible ? ? ?*If you need a refill on your cardiac medications before your next appointment, please call your pharmacy* ? ?Follow-Up: ?At [facility], you and your health needs are our priority.  As part of our continuing mission to provide you with exceptional heart care, we have created designated Provider Care Teams.  These Care Teams include your primary Cardiologist (physician) and Advanced Practice Providers (APPs -  Physician Assistants and Nurse Practitioners) who all work together to provide you with the care you need, when you need it. ? ?We recommend signing up for the patient portal called "MyChart".  Sign up information is provided on this After Visit Summary.  MyChart is used to connect with patients for Virtual Visits (Telemedicine).  Patients are able to view lab/test results, encounter notes, upcoming appointments, etc.  Non-urgent messages can be sent to your provider as well.   ?To learn more about what you can do with MyChart, go to [email or web address].   ? ?Your next appointment:   ?1-2 month(s) ? ?The format for your next appointment:   ?In Person ? ?Provider:   ?Dr. Oval Linsey, Laurann Montana, NP, Or Tommy Medal { ? ?Other Instructions ?Once you make these medication changes if you continue to feel dizzy or lightheaded please call our office.  ? ?Important Information About Sugar ? ? ? ? ? ? ?

## 2021-06-02 NOTE — Progress Notes (Signed)
? ?Office Visit  ?  ?Patient Name: Pedro Lee ?Date of Encounter: 06/02/2021 ? ?PCP:  Rogers Blocker, MD ?  ?[place]  ?Cardiologist:  Sinclair Grooms, MD  ?Advanced Practice Provider:  No care team member to display ?Electrophysiologist:  None  ? ?Chief Complaint  ?  ?Pedro Lee is a 81 y.o. male with a hx of obesity, hypertension, OSA not yet on CPAP, AVNRT s/p ablation, pituitary tumor presents today for hypertension follow-up ? ?Past Medical History  ?  ?Past Medical History:  ?Diagnosis Date  ? Acute on chronic diastolic heart failure ([place]) 12/22/2019  ? Allergy   ? sesonal  ? Complication of anesthesia   ? HX OF ENLARGED PROSTATE AND UNABLE TO PASS WATER AFTER ANESTHESIA  ? Dyspnea   ? with some exertion  ? Dysrhythmia   ? HX ATRIAL FIBRILLATION - HAD ABLATION - NO LONGER HAS AF.  ? Essential hypertension 12/22/2019  ? Essential hypertension, malignant   ? Exertional dyspnea 01/27/2021  ? Gout, unspecified   ? Lower extremity edema 12/22/2019  ? Obesity, mild   ? OSA (obstructive sleep apnea)   ? Mild - PT STATES HE DID NOT HAVE TO USE CPAP  ? Osteoarthritis   ? Osteoarthrosis, unspecified whether generalized or localized, unspecified site   ? Other dyspnea and respiratory abnormality   ? Prostate enlargement   ? DR. Janice Norrie IS PT'S UROLOGIST  ? Pure hypercholesterolemia   ? Pure hypercholesterolemia 01/27/2021  ? Renal insufficiency, mild   ? PT STATES NOT AWARE OF ANY KIDNEY PROBLEM  ? ?Past Surgical History:  ?Procedure Laterality Date  ? COLONOSCOPY    ? CRANIOTOMY N/A 03/24/2016  ? Procedure: Transphenoidal Resection of Tumor;  Surgeon: Earnie Larsson, MD;  Location: [place];  Service: Neurosurgery;  Laterality: N/A;  Transphenoidal Resection of Tumor  ? HEART ABLATION FOR AF  1998  ? JOINT REPLACEMENT    ? LEFT TOTAL KNEE REPLACEMENT  ? KNEE ARTHROPLASTY Left 2013ish  ? LEFT KNEE ARTHROSCOPY    ? TOTAL KNEE ARTHROPLASTY Right 05/30/2013  ? Procedure: RIGHT TOTAL KNEE ARTHROPLASTY;   Surgeon: Mauri Pole, MD;  [facility];  Service: Orthopedics;  Laterality: Right;  ? TRANSNASAL APPROACH N/A 03/24/2016  ? Procedure: TRANSNASAL APPROACH;  Surgeon: Rozetta Nunnery, MD;  Location: [place];  Service: ENT;  Laterality: N/A;  TRANSNASAL APPROACH  ? ? ?Allergies ? ?Allergies  ?Allergen Reactions  ? Penicillins Rash  ? ? ?History of Present Illness  ?  ?Pedro Lee is a 81 y.o. 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 Oval Linsey 01/27/2021. ? ?He was seen by Dr. Tamala Julian in 2019 for exertional dyspnea thought to be due to deconditioning, hypertension, obesity.  He was seen 10/2019 in follow-up and noted to have elevated blood pressure-his PCP had reduced doxazosin due to low blood pressure.  Lexiscan Myoview 10/2019 negative for ischemia with normal LVEF.  When seen 11/2019 his doxazosin was increased to 2 mg twice daily.  Virtual follow-up 11/2019 with BP elevated in the evenings and doxazosin further increase.  He was referred to advanced hypertension clinic. ? ?He lost his wife of 84 years May 2021 and has had understandable difficulty since that time.  ABIs 01/2020 in the setting of leg pain within normal limits.  Symptoms thought to be due to venous insufficiency and provided as needed furosemide.  He saw PharmD 02/2020 and hydralazine added to regimen.  On 03/2020 he had increased lower extremity edema but was not using furosemide.  He was confused regarding his medications and carvedilol, doxazosin increased.  Hydralazine stopped and started on amlodipine.  Repeat renal artery Dopplers 01/27/2021 with mild to moderate blockage bilaterally.  Amlodipine stopped due to lower extremity edema and hydralazine increased.  Doxazosin reduced due to hypotension. AAA duplex 01/2021 with no evidence of abdominal aortic aneurysm  (largest measurement 2.9cm). ? ?Last seen in clinic 01/27/2021.  BP average 140s over 90s.  He noted edema and was recommended to take his Lasix 40 mg daily for 3 days due to volume overload with lower extremity edema, elevated JVP and suspected diastolic heart failure. ? ?He spoke with pharmacist 05/22/2021 noting increased fatigue and episode of lightheadedness with BP 112/62.  He is taking benazepril, carvedilol, hydralazine in the morning.  He was no longer taking doxazosin.  He was recommended to reduce his carvedilol to half tablet.  Evening doxazosin was resumed as BP elevated in the morning after 148/85. ? ?He presents today for follow-up.  He is participating in the prep exercise class at the [facility] which he enjoys.  Since changing the half tablet of carvedilol twice per day and notes that his lightheadedness, dizziness is improved.Takes morning medicine about 6:30-7 and evening around 11pm.  He most often checks his blood pressure prior to his medications with readings 130s-140s.  BP after medications checked very intermittently often 110s. We discussed transitioning to doses closer to 12 hours apart.  She is in his lower extremity edema is overall well controlled with compression stockings.  No new or worsening dyspnea.  Notes occasional sensation of heart racing when he bends over but it resolves within seconds.  He has been staying well-hydrated drinking only 1 cup of coffee in the morning and then 4 to 5 glasses of coconut water throughout the day. ? ? ?EKGs/Labs/Other Studies Reviewed:  ? ?The following studies were reviewed today: ? ?  ?Bilateral Renal Artery Doppler 01/27/2021 (Preliminary Results): ?Summary:  ?Largest Aortic Diameter: 2.9 cm, upper limits of normal  ?   ?Renal:  ?   ?Right: Abnormal size for the right kidney. Normal right Resistive  ?       Index. Normal cortical thickness of right kidney. 1-59%  ?       stenosis of the right renal artery. RRV flow present.  ?Left:  Abnormal size for  the left kidney. Normal left Resistive  ?       Index. Normal cortical thickness of the left kidney. 1-59%  ?       stenosis of the left renal artery. LRV flow present.  ?Mesenteric:  ?Normal Celiac artery and Superior Mesenteric artery findings.  ?   ?Patent IVC.  ?  ?Lexiscan Myoview 11/07/2019: ?Nuclear stress EF: 60%. ?The left ventricular ejection fraction is normal (55-65%). ?There was no ST segment deviation noted during stress. ?The study is normal. ?  ?Echo 11/07/2019: ? 1. Left ventricular ejection fraction, by estimation, is 60 to 65%. The  ?left ventricle has normal function. The left ventricle has no regional  ?wall motion abnormalities. The left ventricular internal cavity size was  ?mildly dilated. There is mild left  ?ventricular hypertrophy. Left ventricular diastolic parameters are  ?consistent with Grade I diastolic dysfunction (impaired relaxation).  ? 2. Right ventricular systolic function is normal. The  right ventricular  ?size is mildly enlarged.  ? 3. Right atrial size was mildly dilated.  ? 4. The mitral valve is normal in structure. Mild mitral valve  ?regurgitation. No evidence of mitral stenosis.  ? 5. The aortic valve is tricuspid. Aortic valve regurgitation is not  ?visualized. Mild aortic valve sclerosis is present, with no evidence of  ?aortic valve stenosis.  ? 6. Aortic dilatation noted. There is mild dilatation of the aortic root,  ?measuring 39 mm.  ? ?EKG: No EKG today ? ?Recent Labs: ?01/29/2021: BNP 72.6 ?04/15/2021: ALT 12; BUN 14; Creatinine, Ser 1.40; Potassium 4.7; Sodium 144  ?Recent Lipid Panel ?   ?Component Value Date/Time  ? CHOL 131 04/15/2021 0948  ? TRIG 145 04/15/2021 0948  ? HDL 38 (L) 04/15/2021 0948  ? CHOLHDL 3.4 04/15/2021 0948  ? [place] 68 04/15/2021 0948  ? ? ?Home Medications  ? ?Current Meds  ?Medication Sig  ? allopurinol (ZYLOPRIM) 300 MG tablet Take 1 tablet by mouth daily.  ? amLODipine (NORVASC) 5 MG tablet Take 5 mg by mouth daily.  ? ammonium lactate  (AMLACTIN) 12 % cream Apply topically.  ? aspirin EC 81 MG tablet Take 81 mg by mouth daily.  ? Cholecalciferol 50 MCG (2000 UT) TABS Take by mouth.  ? ciclopirox (PENLAC) 8 % solution Apply topically at b

## 2021-06-19 NOTE — Progress Notes (Signed)
[facility] PREP Weekly Session ? ?Patient Details  ?Name: Pedro Lee ?MRN: [id number] ?Date of Birth: Jun 30, 1940 ?Age: 81 y.o. ?PCP: Rogers Blocker, MD ? ?Vitals:  ? 06/16/21 1030  ?Weight: (!) 312 lb (141.5 kg)  ? ? ? [facility] Weekly seesion - 06/19/21 1000   ? ?  ? [facility] "PREP" Location  ? [facility]PREP[facility] [place]   ?  ? Weekly Session  ? Topic Discussed Hitting roadblocks   ? Minutes exercised this week 230 minutes   ? Classes attended to date 69   ? ?  ?  ? ?  ? ? ?[name] ?06/19/2021, 10:22 AM ? ? ?

## 2021-06-30 NOTE — Progress Notes (Signed)
[facility] PREP Evaluation  Patient Details  Name: Pedro Lee MRN: [id number] Date of Birth: 27-Jul-1940 Age: 81 y.o. PCP: Rogers Blocker, MD  Vitals:   06/30/21 0930  BP: 138/68  Pulse: 73  SpO2: 97%  Weight: (!) 313 lb 12.8 oz (142.3 kg)     [facility] Eval - 06/30/21 1000       [facility] "PREP" Location   [facility] "PREP" Location [facility]      Referral    Referring Provider [facility] Date --   Last day of class 06/23/21     Measurement   Waist Circumference 53 inches    Hip Circumference 49.5 inches    Body fat --   not measured     Information for Trainer   Goals Plans to come to [facility] walk TW[facility] and strength train.      Mobility and Daily Activities   I find it easy to walk up or down two or more flights of stairs. 1    I have no trouble taking out the trash. 4    I do housework such as vacuuming and dusting on my own without difficulty. 4    I can easily lift a gallon of milk (8lbs). 4    I can easily walk a mile. 3    I have no trouble reaching into high cupboards or reaching down to pick up something from the floor. 2    I do not have trouble doing out-door work such as Armed forces logistics/support/administrative officer, raking leaves, or gardening. 2      Mobility and Daily Activities   I feel younger than my age. 3    I feel independent. 4    I feel energetic. 2    I live an active life.  2    I feel strong. 2    I feel healthy. 4    I feel active as other people my age. 3      How fit and strong are you.   Fit and Strong Total Score 40            Past Medical History:  Diagnosis Date   Acute on chronic diastolic heart failure ([place]) 12/22/2019   Allergy    sesonal   Complication of anesthesia    HX OF ENLARGED PROSTATE AND UNABLE TO PASS WATER AFTER ANESTHESIA   Dyspnea    with some exertion   Dysrhythmia    HX ATRIAL FIBRILLATION - HAD ABLATION - NO LONGER HAS AF.   Essential hypertension 12/22/2019   Essential hypertension, malignant    Exertional dyspnea 01/27/2021    Gout, unspecified    Lower extremity edema 12/22/2019   Obesity, mild    OSA (obstructive sleep apnea)    Mild - PT STATES HE DID NOT HAVE TO USE CPAP   Osteoarthritis    Osteoarthrosis, unspecified whether generalized or localized, unspecified site    Other dyspnea and respiratory abnormality    Prostate enlargement    DR. NESI IS PT'S UROLOGIST   Pure hypercholesterolemia    Pure hypercholesterolemia 01/27/2021   Renal insufficiency, mild    PT STATES NOT AWARE OF ANY KIDNEY PROBLEM   Past Surgical History:  Procedure Laterality Date   COLONOSCOPY     CRANIOTOMY N/A 03/24/2016   Procedure: Transphenoidal Resection of Tumor;  Surgeon: Earnie Larsson, MD;  Location: [place];  Service: Neurosurgery;  Laterality: N/A;  Transphenoidal Resection of Tumor   HEART  ABLATION FOR AF  1998   JOINT REPLACEMENT     LEFT TOTAL KNEE REPLACEMENT   KNEE ARTHROPLASTY Left 2013ish   LEFT KNEE ARTHROSCOPY     TOTAL KNEE ARTHROPLASTY Right 05/30/2013   Procedure: RIGHT TOTAL KNEE ARTHROPLASTY;  Surgeon: Mauri Pole, MD;  [facility];  Service: Orthopedics;  Laterality: Right;   TRANSNASAL APPROACH N/A 03/24/2016   Procedure: TRANSNASAL APPROACH;  Surgeon: Rozetta Nunnery, MD;  Location: [facility];  Service: ENT;  Laterality: N/A;  TRANSNASAL APPROACH   Social History   Tobacco Use  Smoking Status Never  Smokeless Tobacco Never   Cardio march test: from 200 to 278 Sit to stand: 4 to 5 + 4-5 rocks in chair  Bicep curls: 13 to 14 Still needs to continue to do exercises to improve balance  [name] 06/30/2021, 10:27 AM

## 2021-07-17 ENCOUNTER — Other Ambulatory Visit: Payer: Self-pay | Admitting: Cardiovascular Disease

## 2021-07-18 NOTE — Telephone Encounter (Signed)
Rx(s) sent to pharmacy electronically.  

## 2021-08-01 ENCOUNTER — Encounter: Payer: Self-pay | Admitting: Podiatry

## 2021-08-01 ENCOUNTER — Ambulatory Visit: Payer: Medicare PPO | Admitting: Podiatry

## 2021-08-01 DIAGNOSIS — M79674 Pain in right toe(s): Secondary | ICD-10-CM | POA: Diagnosis not present

## 2021-08-01 DIAGNOSIS — M79675 Pain in left toe(s): Secondary | ICD-10-CM | POA: Diagnosis not present

## 2021-08-01 DIAGNOSIS — B351 Tinea unguium: Secondary | ICD-10-CM | POA: Diagnosis not present

## 2021-08-05 ENCOUNTER — Ambulatory Visit (HOSPITAL_BASED_OUTPATIENT_CLINIC_OR_DEPARTMENT_OTHER): Payer: Medicare PPO | Admitting: Family

## 2021-08-18 NOTE — Progress Notes (Unsigned)
Office Visit    Patient Name: Pedro Lee Date of Encounter: 08/19/2021  PCP:  Rogers Blocker, MD   [place]  Cardiologist:  Sinclair Grooms, MD / Dr. Oval Linsey ADV HTN Advanced Practice Provider:  No care team member to display Electrophysiologist:  None   Chief Complaint    Pedro Lee is a 81 y.o. male with a hx of obesity, hypertension, OSA not yet on CPAP, AVNRT s/p ablation, pituitary tumor presents today for hypertension follow-up  Past Medical History    Past Medical History:  Diagnosis Date   Acute on chronic diastolic heart failure ([facility]) 12/22/2019   Allergy    sesonal   Complication of anesthesia    HX OF ENLARGED PROSTATE AND UNABLE TO PASS WATER AFTER ANESTHESIA   Dyspnea    with some exertion   Dysrhythmia    HX ATRIAL FIBRILLATION - HAD ABLATION - NO LONGER HAS AF.   Essential hypertension 12/22/2019   Essential hypertension, malignant    Exertional dyspnea 01/27/2021   Gout, unspecified    Lower extremity edema 12/22/2019   Obesity, mild    OSA (obstructive sleep apnea)    Mild - PT STATES HE DID NOT HAVE TO USE CPAP   Osteoarthritis    Osteoarthrosis, unspecified whether generalized or localized, unspecified site    Other dyspnea and respiratory abnormality    Prostate enlargement    DR. NESI IS PT'S UROLOGIST   Pure hypercholesterolemia    Pure hypercholesterolemia 01/27/2021   Renal insufficiency, mild    PT STATES NOT AWARE OF ANY KIDNEY PROBLEM   Past Surgical History:  Procedure Laterality Date   COLONOSCOPY     CRANIOTOMY N/A 03/24/2016   Procedure: Transphenoidal Resection of Tumor;  Surgeon: Earnie Larsson, MD;  Location: [place];  Service: Neurosurgery;  Laterality: N/A;  Transphenoidal Resection of Tumor   HEART ABLATION FOR AF  1998   JOINT REPLACEMENT     LEFT TOTAL KNEE REPLACEMENT   KNEE ARTHROPLASTY Left 2013ish   LEFT KNEE ARTHROSCOPY     TOTAL KNEE ARTHROPLASTY Right 05/30/2013   Procedure: RIGHT  TOTAL KNEE ARTHROPLASTY;  Surgeon: Mauri Pole, MD;  Location: [facility];  Service: Orthopedics;  Laterality: Right;   TRANSNASAL APPROACH N/A 03/24/2016   Procedure: TRANSNASAL APPROACH;  Surgeon: Rozetta Nunnery, MD;  Location: [place];  Service: ENT;  Laterality: N/A;  TRANSNASAL APPROACH    Allergies  Allergies  Allergen Reactions   Penicillins Rash    History of Present Illness    Pedro Lee is a 81 y.o. 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 Tamala Julian in 2019 for exertional dyspnea thought to be due to deconditioning, hypertension, obesity.  He was seen 10/2019 in follow-up and noted to have elevated blood pressure-his PCP had reduced doxazosin due to low blood pressure.  Lexiscan Myoview 10/2019 negative for ischemia with normal LVEF.  When seen 11/2019 his doxazosin was increased to 2 mg twice daily.  Virtual follow-up 11/2019 with BP elevated in the evenings and doxazosin further increase.  He was referred to advanced hypertension clinic.  He lost his wife of 10 years May 2021 and has had understandable difficulty since that time.  ABIs 01/2020 in the setting of leg pain within normal limits.  Symptoms thought to be due to venous insufficiency and provided as needed furosemide.  He saw PharmD 02/2020 and hydralazine added to regimen.  On 03/2020 he had increased lower extremity edema but was not using furosemide.  He was confused regarding his medications and carvedilol, doxazosin increased.  Hydralazine stopped and started on amlodipine.  Repeat renal artery Dopplers 01/27/2021 with mild to moderate blockage bilaterally.  Amlodipine stopped due to lower extremity edema and hydralazine increased.  Doxazosin reduced due to hypotension. AAA duplex 01/2021 with no evidence of abdominal aortic aneurysm  (largest measurement 2.9cm).  Seen in clinic 01/27/2021 BP average 140s over 90s and edema recommended Lasix '40mg'$  QD daily for 3 days.   He spoke with pharmacist 05/22/2021 noting increased fatigue and episode of lightheadedness with BP 112/62.  He is taking benazepril, carvedilol, hydralazine in the morning.  He was no longer taking doxazosin.  He was recommended to reduce his carvedilol to half tablet.  Evening doxazosin was resumed as BP elevated in the morning after 148/85.  Follow-up/2023 lightheadedness, dizziness improved since reduced dose of carvedilol.  Encouraged to take his medications 12 hours apart as he was taking it 6:30 AM and 11 PM.  BP at home 130s-140s at time of his medications and after medications 110s.  Edema controlled with compression socks.  Was participating in prep program which he enjoyed.  Presents today for follow-up independently. Just got back from a trip to [place] to visit his sister. Notes BP has been labile at home but overall at goal of <130/80. Taking medications at 7am and 10pm. No chest pain, palpitations, edema, orthopnea, PND. Notes feeling exhausted, dyspneic with exertion. Feels this is not improving even with completing PREP program. Plans to continue exercise at [facility].  EKGs/Labs/Other Studies Reviewed:   The following studies were reviewed today:  Bilateral Renal Artery Doppler 01/27/2021 (Preliminary Results): Summary:  Largest Aortic Diameter: 2.9 cm, upper limits of normal     Renal:     Right: Abnormal size for the right kidney. Normal right Resistive         Index. Normal cortical thickness of right kidney. 1-59%         stenosis of the right renal artery. RRV flow present.  Left:  Abnormal size for the left kidney. Normal left Resistive         Index. Normal cortical thickness of the left kidney. 1-59%         stenosis of the left renal artery. LRV flow present.  Mesenteric:  Normal Celiac artery and Superior Mesenteric artery  findings.     Patent IVC.    Lexiscan Myoview 11/07/2019: Nuclear stress EF: 60%. The left ventricular ejection fraction is normal (55-65%). There was no ST segment deviation noted during stress. The study is normal.   Echo 11/07/2019:  1. Left ventricular ejection fraction, by estimation, is 60 to 65%. The  left ventricle has normal function. The left ventricle has no regional  wall motion abnormalities. The left ventricular internal cavity size was  mildly dilated. There is mild left  ventricular hypertrophy. Left ventricular diastolic parameters are  consistent with Grade I diastolic dysfunction (impaired relaxation).   2. Right ventricular systolic function is normal. The right ventricular  size is mildly enlarged.   3. Right atrial size was mildly dilated.   4. The mitral valve is normal in structure. Mild mitral valve  regurgitation. No evidence of mitral stenosis.   5. The aortic valve is tricuspid. Aortic valve  regurgitation is not  visualized. Mild aortic valve sclerosis is present, with no evidence of  aortic valve stenosis.   6. Aortic dilatation noted. There is mild dilatation of the aortic root,  measuring 39 mm.   EKG: EKG ordered today. EKG performed today demonstrates NSR 62 bpm with stable TWI lateral leads.   Recent Labs: 01/29/2021: BNP 72.6 04/15/2021: ALT 12; BUN 14; Creatinine, Ser 1.40; Potassium 4.7; Sodium 144  Recent Lipid Panel    Component Value Date/Time   CHOL 131 04/15/2021 0948   TRIG 145 04/15/2021 0948   HDL 38 (L) 04/15/2021 0948   CHOLHDL 3.4 04/15/2021 0948   LDLCALC 68 04/15/2021 0948    Home Medications   Current Meds  Medication Sig   allopurinol (ZYLOPRIM) 300 MG tablet Take 1 tablet by mouth daily.   amLODipine (NORVASC) 5 MG tablet TAKE 1 TABLET(5 MG) BY MOUTH AT BEDTIME   ammonium lactate (AMLACTIN) 12 % cream Apply topically.   aspirin EC 81 MG tablet Take 81 mg by mouth daily.   carvedilol (COREG) 12.5 MG tablet Take 1  tablet (12.5 mg total) by mouth 2 (two) times daily with a meal.   Cholecalciferol 50 MCG (2000 UT) TABS Take by mouth.   ciclopirox (PENLAC) 8 % solution Apply topically at bedtime. Apply over nail and surrounding skin. Apply daily over previous coat. After seven (7) days, may remove with alcohol and continue cycle.   clotrimazole-betamethasone (LOTRISONE) cream Apply 1 application topically 2 (two) times daily.   colchicine 0.6 MG tablet Take by mouth.   doxazosin (CARDURA) 2 MG tablet Take 1 tablet (2 mg total) by mouth at bedtime. Please take one '2mg'$  tablet daily in the evening   fluticasone (FLONASE) 50 MCG/ACT nasal spray 2 sprays daily as needed for allergies or rhinitis.   furosemide (LASIX) 40 MG tablet TAKE 1 TABLET BY MOUTH DAILY AS NEEDED FOR SWELLING   hydrALAZINE (APRESOLINE) 25 MG tablet Take 1 tablet (25 mg total) by mouth in the morning and at bedtime.   montelukast (SINGULAIR) 10 MG tablet Take 1 tablet by mouth daily.   rosuvastatin (CRESTOR) 20 MG tablet Take 1 tablet (20 mg total) by mouth daily.   tamsulosin (FLOMAX) 0.4 MG CAPS capsule Take 0.4 mg by mouth daily.     Review of Systems      All other systems reviewed and are otherwise negative except as noted above.  Physical Exam    VS:  BP 118/68 (BP Location: Left Arm, Patient Position: Sitting, Cuff Size: Large)   Pulse 62   Ht '6\' 5"'$  (1.956 m)   Wt (!) 313 lb 9.6 oz (142.2 kg)   BMI 37.19 kg/m  , BMI Body mass index is 37.19 kg/m.  Wt Readings from Last 3 Encounters:  08/19/21 (!) 313 lb 9.6 oz (142.2 kg)  06/30/21 (!) 313 lb 12.8 oz (142.3 kg)  06/16/21 (!) 312 lb (141.5 kg)    GEN: Well nourished, overweight, well developed, in no acute distress. HEENT: normal. Neck: Supple, no JVD, carotid bruits, or masses. Cardiac: RRR, no murmurs, rubs, or gallops. No clubbing, cyanosis, edema.  Radials/PT 2+ and equal bilaterally.  Respiratory:  Respirations regular and unlabored, clear to auscultation  bilaterally. GI: Soft, nontender, nondistended. MS: No deformity or atrophy. Skin: Warm and dry, no rash. Neuro:  Strength and sensation are intact. Psych: Normal affect.  Assessment & Plan    HTN -BP well controlled. Continue current antihypertensive regimen.  Continue carvedilol 12.5 mg twice  daily, hydralazine 25 mg twice daily, amlodipine 5 mg at night.    Obesity - Weight loss via diet and exercise encouraged. Discussed the impact being overweight would have on cardiovascular risk. Could consider Wegovy in future, defer today due to shortage.  Renal artery stenosis - 01/2021 bilateral 1-59% stenosis. Consider repeat duplex in 1 year.   Chronic diastolic heart failure - Grossly euvolemic on exam.  GDMT includes carvedilol, PRN Lasix. Low sodium diet, fluid restriction <2L, and daily weights encouraged. Educated to contact our office for weight gain of 2 lbs overnight or 5 lbs in one week. Pending result of BNP, echo consider SGLT2i. Hesitant to utilize MRA due to high normal potassium.   Hyperlipidemia -continue rosuvastatin 20 mg daily. 04/15/21 LDL 68.   Exertional dyspnea -overall unchanged.  As it did not improve with completing PREP [employer] program, plan for further evaluation. BNP, CBC today to rule out volume overload, anemia. Update echo to reassess LVEF, pulmonary pressures.  If unrevealing, consider ischemic eval with CTA.  Disposition: Follow up in 2 months with Dr. Oval Linsey or Loel Dubonnet, NP   Signed, Loel Dubonnet, NP 08/19/2021, 8:30 AM [place]

## 2021-08-19 ENCOUNTER — Ambulatory Visit (HOSPITAL_BASED_OUTPATIENT_CLINIC_OR_DEPARTMENT_OTHER): Payer: Medicare PPO | Admitting: Family

## 2021-08-19 ENCOUNTER — Encounter (HOSPITAL_BASED_OUTPATIENT_CLINIC_OR_DEPARTMENT_OTHER): Payer: Self-pay | Admitting: Family

## 2021-08-19 VITALS — BP 118/68 | HR 62 | Ht 77.0 in | Wt 313.6 lb

## 2021-08-19 DIAGNOSIS — R0609 Other forms of dyspnea: Secondary | ICD-10-CM | POA: Diagnosis not present

## 2021-08-19 DIAGNOSIS — I5032 Chronic diastolic (congestive) heart failure: Secondary | ICD-10-CM | POA: Diagnosis not present

## 2021-08-19 DIAGNOSIS — I10 Essential (primary) hypertension: Secondary | ICD-10-CM

## 2021-08-19 DIAGNOSIS — E782 Mixed hyperlipidemia: Secondary | ICD-10-CM

## 2021-08-19 NOTE — Patient Instructions (Signed)
Medication Instructions:  Your Physician recommend you continue on your current medication as directed.    *If you need a refill on your cardiac medications before your next appointment, please call your pharmacy*   Lab Work: Your physician recommends that you return for lab work Today- BMP, BNP, CBC   If you have labs (blood work) drawn today and your tests are completely normal, you will receive your results only by: MyChart Message (if you have MyChart) OR A paper copy in the mail If you have any lab test that is abnormal or we need to change your treatment, we will call you to review the results.   Testing/Procedures: Your physician has requested that you have an echocardiogram. Echocardiography is a painless test that uses sound waves to create images of your heart. It provides your doctor with information about the size and shape of your heart and how well your heart's chambers and valves are working. This procedure takes approximately one hour. There are no restrictions for this procedure. [place], you and your health needs are our priority.  As part of our continuing mission to provide you with exceptional heart care, we have created designated Provider Care Teams.  These Care Teams include your primary Cardiologist (physician) and Advanced Practice Providers (APPs -  Physician Assistants and Nurse Practitioners) who all work together to provide you with the care you need, when you need it.  We recommend signing up for the patient portal called "MyChart".  Sign up information is provided on this After Visit Summary.  MyChart is used to connect with patients for Virtual Visits (Telemedicine).  Patients are able to view lab/test results, encounter notes, upcoming appointments, etc.  Non-urgent messages can be sent to your provider as well.   To learn more about what you can do with MyChart, go to [email or web address].     Your next appointment:   2 month(s)  The format for your next appointment:   In Person  Provider:   Skeet Latch, MD or Laurann Montana, NP{  Other Instructions Heart Healthy Diet Recommendations: A low-salt diet is recommended. Meats should be grilled, baked, or boiled. Avoid fried foods. Focus on lean protein sources like fish or chicken with vegetables and fruits. The American Heart Association is a Microbiologist!  American Heart Association Diet and Lifeystyle Recommendations   Exercise recommendations: The American Heart Association recommends 150 minutes of moderate intensity exercise weekly. Try 30 minutes of moderate intensity exercise 4-5 times per week. This could include walking, jogging, or swimming.   Important Information About Sugar

## 2021-08-20 LAB — BASIC METABOLIC PANEL
BUN/Creatinine Ratio: 9 — ABNORMAL LOW (ref 10–24)
BUN: 12 mg/dL (ref 8–27)
CO2: 24 mmol/L (ref 20–29)
Calcium: 9.8 mg/dL (ref 8.6–10.2)
Chloride: 108 mmol/L — ABNORMAL HIGH (ref 96–106)
Creatinine, Ser: 1.41 mg/dL — ABNORMAL HIGH (ref 0.76–1.27)
Glucose: 93 mg/dL (ref 70–99)
Potassium: 4.4 mmol/L (ref 3.5–5.2)
Sodium: 149 mmol/L — ABNORMAL HIGH (ref 134–144)
eGFR: 50 mL/min/{1.73_m2} — ABNORMAL LOW (ref >59)

## 2021-08-20 LAB — CBC
Hematocrit: 40.8 % (ref 37.5–51.0)
Hemoglobin: 13.5 g/dL (ref 13.0–17.7)
MCH: 28 pg (ref 26.6–33.0)
MCHC: 33.1 g/dL (ref 31.5–35.7)
MCV: 85 fL (ref 79–97)
Platelets: 167 10*3/uL (ref 150–450)
RBC: 4.82 x10E6/uL (ref 4.14–5.80)
RDW: 13.8 % (ref 11.6–15.4)
WBC: 6.6 10*3/uL (ref 3.4–10.8)

## 2021-08-20 LAB — BRAIN NATRIURETIC PEPTIDE: BNP: 69.2 pg/mL (ref 0.0–100.0)

## 2021-08-21 ENCOUNTER — Telehealth (HOSPITAL_BASED_OUTPATIENT_CLINIC_OR_DEPARTMENT_OTHER): Payer: Self-pay

## 2021-08-21 NOTE — Telephone Encounter (Addendum)
Results called to patient who verbalizes understanding!      ----- Message from Loel Dubonnet, NP sent at 08/20/2021  9:17 PM EDT ----- Stable kidney function. BNP no volume overload. CBC with no evidence of anemia nor infection.  Possible slight dehydration with increased sodium, chloride.   Continue current medications. Consider medication changes pending echo results.

## 2021-08-22 ENCOUNTER — Ambulatory Visit: Payer: Medicare PPO

## 2021-09-02 ENCOUNTER — Telehealth (HOSPITAL_BASED_OUTPATIENT_CLINIC_OR_DEPARTMENT_OTHER): Payer: Self-pay

## 2021-09-02 ENCOUNTER — Ambulatory Visit (HOSPITAL_COMMUNITY): Payer: Medicare PPO | Attending: Internal Medicine

## 2021-09-02 DIAGNOSIS — I5032 Chronic diastolic (congestive) heart failure: Secondary | ICD-10-CM

## 2021-09-02 DIAGNOSIS — R0609 Other forms of dyspnea: Secondary | ICD-10-CM

## 2021-09-02 LAB — ECHOCARDIOGRAM COMPLETE
Area-P 1/2: 3.42 cm2
P 1/2 time: 274 msec
S' Lateral: 3 cm

## 2021-09-02 MED ORDER — DAPAGLIFLOZIN PROPANEDIOL 10 MG PO TABS: 10.0000 mg | ORAL_TABLET | Freq: Every day | ORAL | 3 refills | Status: DC

## 2021-09-02 NOTE — Telephone Encounter (Addendum)
Results called to patient who verbalizes understanding! Labs ordered and mailed, prescriptions updated and sent to pharmacy on file      ----- Message from Loel Dubonnet, NP sent at 09/02/2021  1:43 PM EDT ----- Echo with normal heart muscle function.  Moderate thickening and mild stiffness of the heart muscle.  Mildly elevated pressures in the lungs.  Bilateral atria mildly dilated.  RV moderately enlarged.  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  For optimization of heart failure therapy would recommend addition of Farxiga 10 mg daily. BMP 2 weeks after starting. Followup in clinic as scheduled.  Repeat echo in 1 year for monitoring.

## 2021-09-02 NOTE — Telephone Encounter (Signed)
[employer] [place] prior auth started; Per cover my meds prior auth not required.

## 2021-09-04 ENCOUNTER — Telehealth: Payer: Self-pay | Admitting: Family

## 2021-09-04 NOTE — Telephone Encounter (Signed)
Pt c/o medication issue:  1. Name of Medication:   dapagliflozin propanediol (FARXIGA) 10 MG TABS tablet    2. How are you currently taking this medication (dosage and times per day)? Take 1 tablet (10 mg total) by mouth daily before breakfast  3. Are you having a reaction (difficulty breathing--STAT)? No  4. What is your medication issue? Pt has questions regarding medication. He would like a callback. Please advise

## 2021-09-04 NOTE — Telephone Encounter (Signed)
-  Pt called questioning why he was prescribed farxiga.  -Pt advised of ECHO results along with NP's recommendations for starting medication. -Pt verbalized understanding.   Loel Dubonnet, NP  09/02/2021  1:43 PM EDT     Echo with normal heart muscle function.  Moderate thickening and mild stiffness of the heart muscle.  Mildly elevated pressures in the lungs.  Bilateral atria mildly dilated.  RV moderately enlarged.  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   For optimization of heart failure therapy would recommend addition of Farxiga 10 mg daily. BMP 2 weeks after starting. Followup in clinic as scheduled.   Repeat echo in 1 year for monitoring.

## 2021-09-16 ENCOUNTER — Telehealth (HOSPITAL_BASED_OUTPATIENT_CLINIC_OR_DEPARTMENT_OTHER): Payer: Self-pay

## 2021-09-16 LAB — BASIC METABOLIC PANEL
BUN/Creatinine Ratio: 11 (ref 10–24)
BUN: 15 mg/dL (ref 8–27)
CO2: 23 mmol/L (ref 20–29)
Calcium: 9.5 mg/dL (ref 8.6–10.2)
Chloride: 103 mmol/L (ref 96–106)
Creatinine, Ser: 1.37 mg/dL — ABNORMAL HIGH (ref 0.76–1.27)
Glucose: 79 mg/dL (ref 70–99)
Potassium: 4.7 mmol/L (ref 3.5–5.2)
Sodium: 142 mmol/L (ref 134–144)
eGFR: 52 mL/min/{1.73_m2} — ABNORMAL LOW (ref >59)

## 2021-09-16 NOTE — Telephone Encounter (Addendum)
1st call attempt, unable to leave message   ----- Message from Loel Dubonnet, NP sent at 09/16/2021  7:47 AM EDT ----- Stable kidney function.  Normal electrolytes.  Continue current medications.

## 2021-09-17 ENCOUNTER — Telehealth (HOSPITAL_BASED_OUTPATIENT_CLINIC_OR_DEPARTMENT_OTHER): Payer: Self-pay

## 2021-09-17 NOTE — Telephone Encounter (Addendum)
Call attempt 2, no answer, unable to leave message    ----- Message from Loel Dubonnet, NP sent at 09/16/2021  7:47 AM EDT ----- Stable kidney function.  Normal electrolytes.  Continue current medications.

## 2021-09-19 ENCOUNTER — Encounter (HOSPITAL_BASED_OUTPATIENT_CLINIC_OR_DEPARTMENT_OTHER): Payer: Self-pay

## 2021-09-19 ENCOUNTER — Telehealth (HOSPITAL_BASED_OUTPATIENT_CLINIC_OR_DEPARTMENT_OTHER): Payer: Self-pay

## 2021-09-19 NOTE — Telephone Encounter (Addendum)
3rd call attempt no answer, results mailed to patient.   ----- Message from Loel Dubonnet, NP sent at 09/16/2021  7:47 AM EDT ----- Stable kidney function.  Normal electrolytes.  Continue current medications.

## 2021-09-22 IMAGING — DX DG CHEST 2V
2 series · 2 of 2 positions shown · non-contrast
Comparison: Radiograph 02/20/2019

CLINICAL DATA: Follow-up pneumonia.

EXAM:
CHEST - 2 VIEW

[dg chest 2 view (1 of 2)]
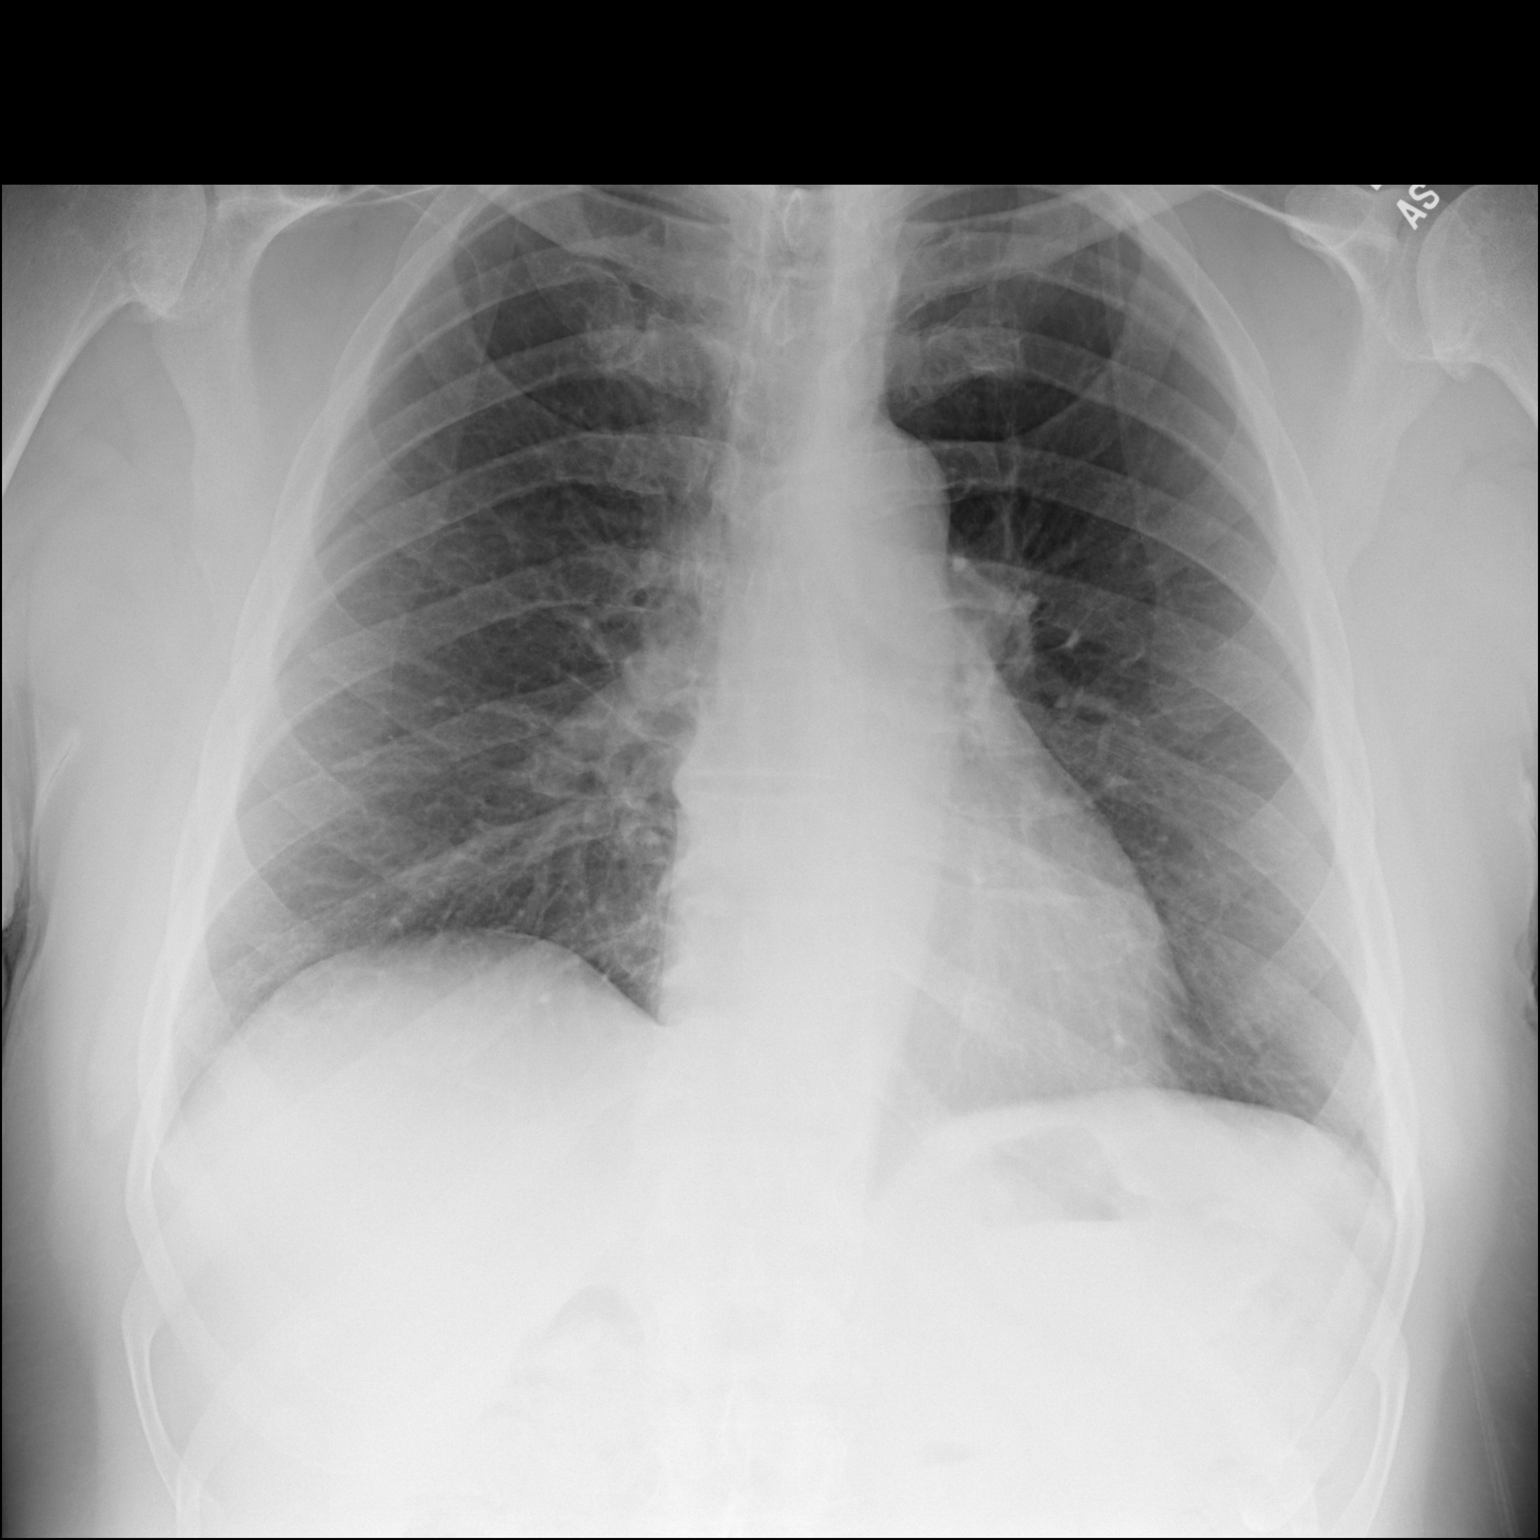

[dg chest 2 view (2 of 2)]
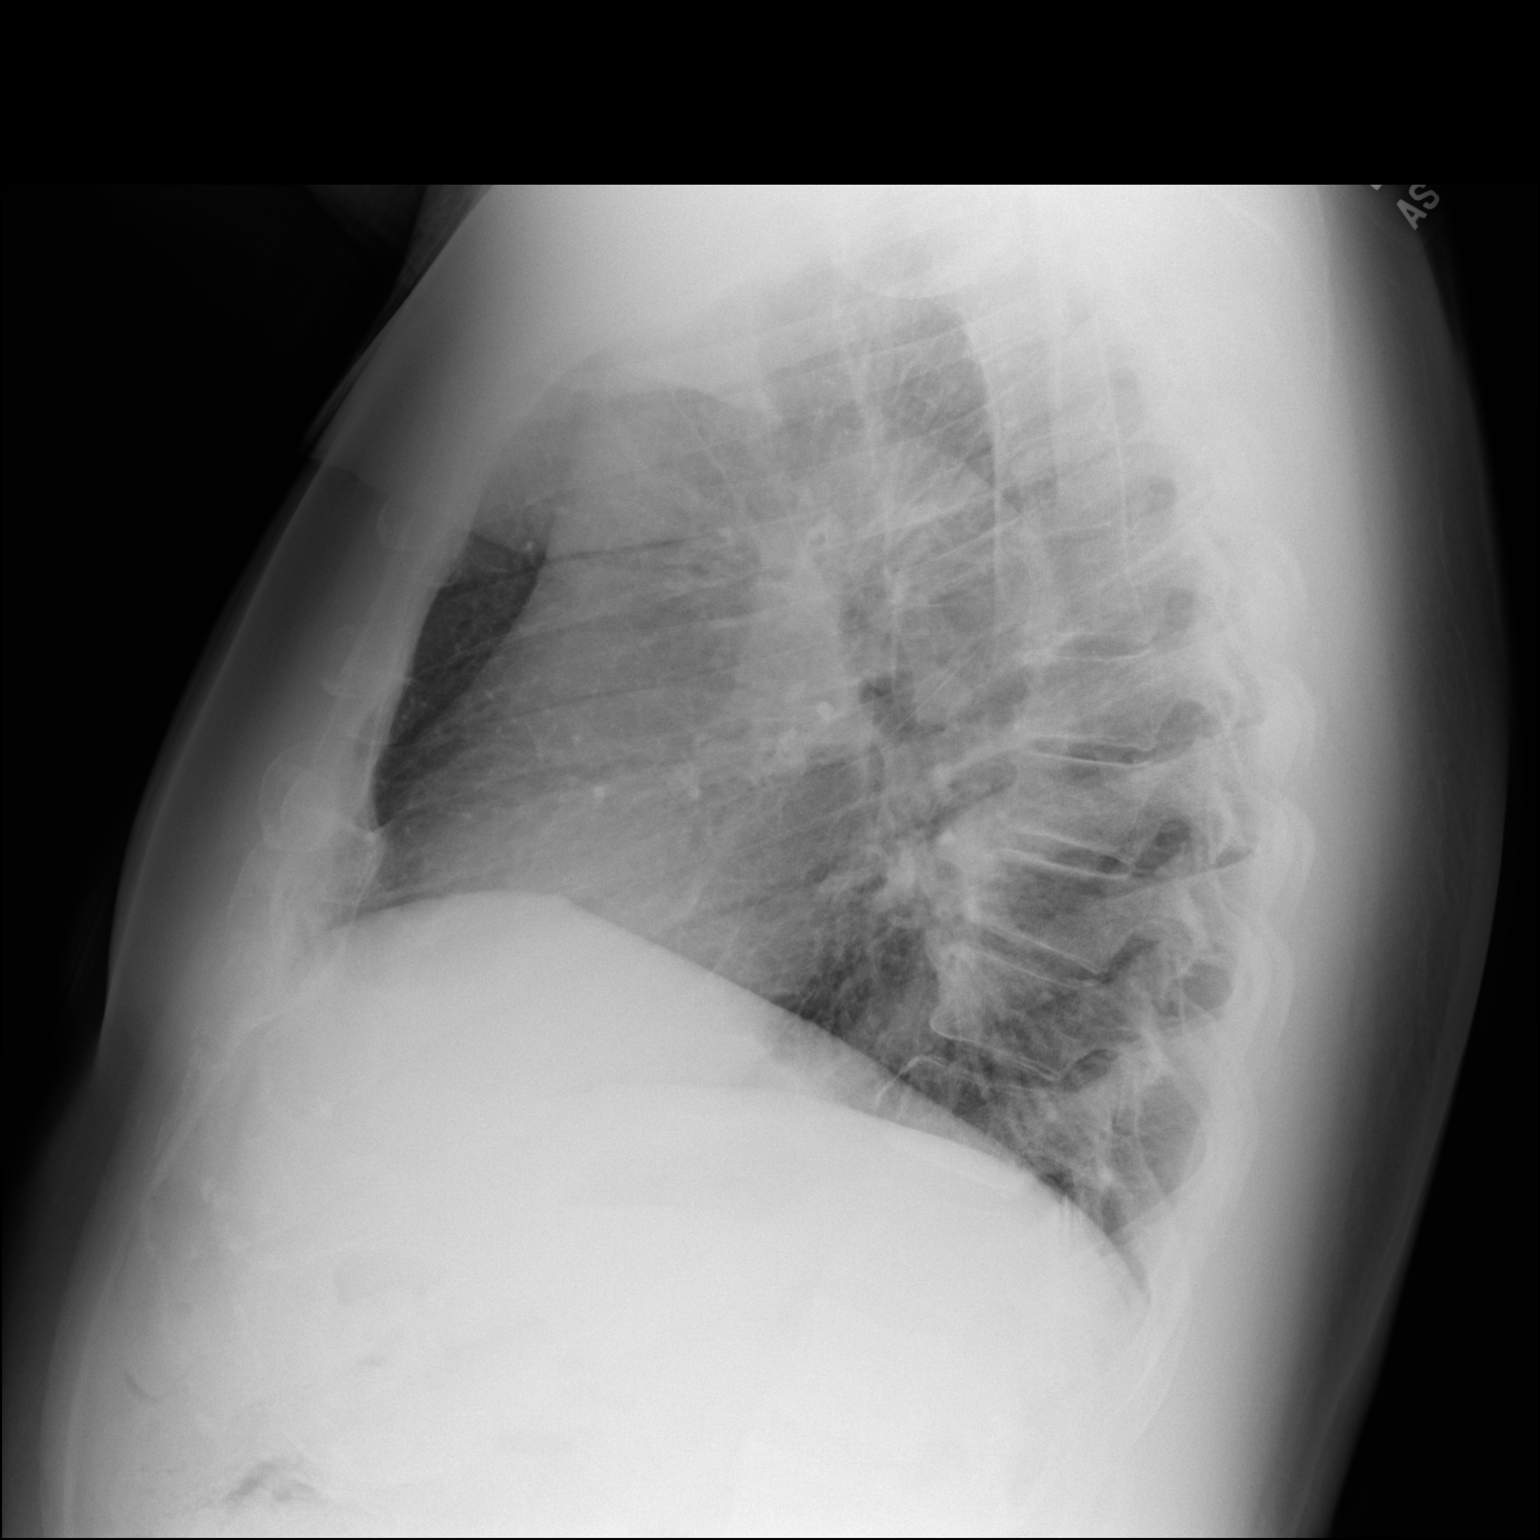

[2 of 2 positions shown; findings below may reference images not displayed]

FINDINGS: Previous right lung base opacity has resolved. No new airspace
disease. Normal heart size and mediastinal contours. No pulmonary
edema, pleural effusion, or pneumothorax. There is degenerative
change of both shoulders.
IMPRESSION: Prior right lung base opacity has resolved.  No acute findings.

## 2021-10-20 ENCOUNTER — Encounter (HOSPITAL_BASED_OUTPATIENT_CLINIC_OR_DEPARTMENT_OTHER): Payer: Self-pay

## 2021-10-20 ENCOUNTER — Ambulatory Visit (HOSPITAL_BASED_OUTPATIENT_CLINIC_OR_DEPARTMENT_OTHER): Payer: Medicare PPO | Admitting: Family

## 2021-10-20 ENCOUNTER — Encounter (HOSPITAL_BASED_OUTPATIENT_CLINIC_OR_DEPARTMENT_OTHER): Payer: Self-pay | Admitting: Family

## 2021-10-20 VITALS — BP 134/76 | HR 64 | Ht 77.0 in | Wt 308.9 lb

## 2021-10-20 DIAGNOSIS — I10 Essential (primary) hypertension: Secondary | ICD-10-CM

## 2021-10-20 DIAGNOSIS — I5032 Chronic diastolic (congestive) heart failure: Secondary | ICD-10-CM | POA: Diagnosis not present

## 2021-10-20 DIAGNOSIS — Z6839 Body mass index (BMI) 39.0-39.9, adult: Secondary | ICD-10-CM

## 2021-10-20 DIAGNOSIS — I701 Atherosclerosis of renal artery: Secondary | ICD-10-CM

## 2021-10-20 DIAGNOSIS — R82998 Other abnormal findings in urine: Secondary | ICD-10-CM | POA: Diagnosis not present

## 2021-10-20 MED ORDER — DAPAGLIFLOZIN PROPANEDIOL 10 MG PO TABS: ORAL_TABLET | ORAL | 3 refills | Status: DC

## 2021-10-20 MED ORDER — AMLODIPINE BESYLATE 5 MG PO TABS: ORAL_TABLET | ORAL | 3 refills | Status: DC

## 2021-10-20 NOTE — Patient Instructions (Signed)
Medication Instructions:  Your physician has recommended you make the following change in your medication:   CHANGE Farxiga to half tablet daily  *If you need a refill on your cardiac medications before your next appointment, please call your pharmacy*   Lab Work: Your physician recommends that you return for lab work today: urinalysis  If you have labs (blood work) drawn today and your tests are completely normal, you will receive your results only by: [place] (if you have MyChart) OR A paper copy in the mail If you have any lab test that is abnormal or we need to change your treatment, we will call you to review the results.   Testing/Procedures: None ordered today.    Follow-Up: At [facility], you and your health needs are our priority.  As part of our continuing mission to provide you with exceptional heart care, we have created designated Provider Care Teams.  These Care Teams include your primary Cardiologist (physician) and Advanced Practice Providers (APPs -  Physician Assistants and Nurse Practitioners) who all work together to provide you with the care you need, when you need it.  We recommend signing up for the patient portal called "MyChart".  Sign up information is provided on this After Visit Summary.  MyChart is used to connect with patients for Virtual Visits (Telemedicine).  Patients are able to view lab/test results, encounter notes, upcoming appointments, etc.  Non-urgent messages can be sent to your provider as well.   To learn more about what you can do with MyChart, go to [email or web address].    Your next appointment:   3 month(s)  The format for your next appointment:   In Person  Provider:   Skeet Latch, MD or Laurann Montana, NP    Other Instructions  Heart Healthy Diet Recommendations: A low-salt diet is recommended. Meats should be grilled, baked, or boiled. Avoid fried foods. Focus on lean protein sources like fish or  chicken with vegetables and fruits. The American Heart Association is a Microbiologist!  American Heart Association Diet and Lifeystyle Recommendations .  Exercise recommendations: The American Heart Association recommends 150 minutes of moderate intensity exercise weekly. Try 30 minutes of moderate intensity exercise 4-5 times per week. This could include walking, jogging, or swimming.  To prevent or reduce lower extremity swelling: Eat a low salt diet. Salt makes the body hold onto extra fluid which causes swelling. Sit with legs elevated. For example, in the recliner or on an [place].  Wear knee-high compression stockings during the daytime. Ones labeled 15-20 mmHg provide good compression.   Important Information About Sugar

## 2021-10-20 NOTE — Progress Notes (Signed)
**Note Pedro-Identified via Obfuscation** Office Visit    Patient Name: Pedro Lee Date of Encounter: 10/20/2021  PCP:  Rogers Blocker, MD   [facility]  Cardiologist:  Sinclair Grooms, MD / Dr. Oval Linsey ADV HTN Advanced Practice Provider:  No care team member to display Electrophysiologist:  None   Chief Complaint    Pedro Lee is a 81 y.o. male with a hx of obesity, hypertension, OSA not yet on CPAP, AVNRT s/p ablation, pituitary tumor presents today for follow-up after addition of Farxiga  Past Medical History    Past Medical History:  Diagnosis Date   Acute on chronic diastolic heart failure ([place]) 12/22/2019   Allergy    sesonal   Complication of anesthesia    HX OF ENLARGED PROSTATE AND UNABLE TO PASS WATER AFTER ANESTHESIA   Dyspnea    with some exertion   Dysrhythmia    HX ATRIAL FIBRILLATION - HAD ABLATION - NO LONGER HAS AF.   Essential hypertension 12/22/2019   Essential hypertension, malignant    Exertional dyspnea 01/27/2021   Gout, unspecified    Lower extremity edema 12/22/2019   Obesity, mild    OSA (obstructive sleep apnea)    Mild - PT STATES HE DID NOT HAVE TO USE CPAP   Osteoarthritis    Osteoarthrosis, unspecified whether generalized or localized, unspecified site    Other dyspnea and respiratory abnormality    Prostate enlargement    DR. NESI IS PT'S UROLOGIST   Pure hypercholesterolemia    Pure hypercholesterolemia 01/27/2021   Renal insufficiency, mild    PT STATES NOT AWARE OF ANY KIDNEY PROBLEM   Past Surgical History:  Procedure Laterality Date   COLONOSCOPY     CRANIOTOMY N/A 03/24/2016   Procedure: Transphenoidal Resection of Tumor;  Surgeon: Earnie Larsson, MD;  Location: [facility];  Service: Neurosurgery;  Laterality: N/A;  Transphenoidal Resection of Tumor   HEART ABLATION FOR AF  1998   JOINT REPLACEMENT     LEFT TOTAL KNEE REPLACEMENT   KNEE ARTHROPLASTY Left 2013ish   LEFT KNEE ARTHROSCOPY     TOTAL KNEE ARTHROPLASTY Right 05/30/2013    Procedure: RIGHT TOTAL KNEE ARTHROPLASTY;  Surgeon: Mauri Pole, MD;  Location: [facility];  Service: Orthopedics;  Laterality: Right;   TRANSNASAL APPROACH N/A 03/24/2016   Procedure: TRANSNASAL APPROACH;  Surgeon: Rozetta Nunnery, MD;  Location: [facility];  Service: ENT;  Laterality: N/A;  TRANSNASAL APPROACH    Allergies  Allergies  Allergen Reactions   Penicillins Rash    History of Present Illness    Pedro Lee is a 81 y.o. 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 Tamala Julian in 2019 for exertional dyspnea thought to be due to deconditioning, hypertension, obesity.  He was seen 10/2019 in follow-up and noted to have elevated blood pressure-his PCP had reduced doxazosin due to low blood pressure.  Lexiscan Myoview 10/2019 negative for ischemia with normal LVEF.  When seen 11/2019 his doxazosin was increased to 2 mg twice daily.  Virtual follow-up 11/2019 with BP elevated in the evenings and doxazosin further increase.  He was referred to advanced hypertension clinic.  He lost his wife of 57 years May 2021 and has had understandable difficulty since that time.  ABIs 01/2020 in the setting of leg pain  within normal limits.  Symptoms thought to be due to venous insufficiency and provided as needed furosemide.  02/2020 and hydralazine added for hypertension.  On 03/2020 he had increased lower extremity edema but was not using furosemide.  He was confused regarding his medications and carvedilol, doxazosin increased.  Hydralazine stopped and started on amlodipine.  Repeat renal artery Dopplers 01/27/2021 with mild to moderate blockage bilaterally.  Amlodipine stopped due to lower extremity edema and hydralazine increased.  Doxazosin reduced due to hypotension. AAA duplex 01/2021 with no evidence of abdominal  aortic aneurysm (largest measurement 2.9cm).  Seen in clinic 01/27/2021 BP average 140s over 90s and edema recommended Lasix '40mg'$  QD daily for 3 days. Spoke with pharmacist 05/22/2021 with BP 112/62.  He is taking benazepril, carvedilol, hydralazine in the morning.  He was no longer taking doxazosin.  He was recommended to reduce his carvedilol to half tablet.  Evening doxazosin was resumed as BP elevated in the morning after 148/85.  Follow-up 05/2021 lightheadedness, dizziness improved since reduced dose of carvedilol.  Edema controlled with compression socks.  Was participating in [place].  Seen 08/2021 with repeat echo performed 09/02/21 EF 65-70%, no RWMA, moderate LVH, gr1DD, RV moderately enlarged, mildly elevated PASP, mild dilation ascending aorta 36m. FWilder Gladeinitiated.   Presents today for follow-up independently. Has scale at home but not weighing daily. BP at home most often 130s. Reports no shortness of breath at rest and stable dyspnea on exertion. Reports no chest pain, pressure, or tightness. He is wearing compression stockings and notes lower extremity edema  which might be a bit worse than usual. Is often better in the morning and worse in the evening. No  orthopnea, PND. Reports no palpitations.  Weight down 5 pounds. Since starting FWilder Gladefeels he is usually going to the bathroom very often. This morning urine was dark and had a metallic smell. No dysuria. Not requiring Furosemide.   EKGs/Labs/Other Studies Reviewed:   The following studies were reviewed today:  Echo 08/2021  1. Left ventricular ejection fraction, by estimation, is 65 to 70%. The  left ventricle has normal function. The left ventricle has no regional  wall motion abnormalities. There is moderate concentric left ventricular  hypertrophy. Left ventricular  diastolic parameters are consistent with Grade I diastolic dysfunction  (impaired relaxation).   2. Right ventricular systolic function is normal. The right  ventricular  size is moderately enlarged. There is mildly elevated pulmonary artery  systolic pressure. The estimated right ventricular systolic pressure is  417.0mmHg.   3. Left atrial size was mildly dilated.   4. Right atrial size was mildly dilated.   5. The mitral valve is normal in structure. Trivial mitral valve  regurgitation. No evidence of mitral stenosis.   6. The aortic valve is tricuspid. Aortic valve regurgitation is not  visualized.   7. Aortic dilatation noted. There is mild dilatation of the ascending  aorta, measuring 41 mm.   Comparison(s): Prior images reviewed side by side. Increase in aortic  size.   Bilateral Renal Artery Doppler 01/27/2021 (Preliminary Results): Summary:  Largest Aortic Diameter: 2.9 cm, upper limits of normal     Renal:     Right: Abnormal size for the right kidney. Normal right Resistive         Index. Normal cortical thickness of right kidney. 1-59%         stenosis of the right renal artery. RRV flow present.  Left:  Abnormal size for the left kidney. Normal left  Resistive         Index. Normal cortical thickness of the left kidney. 1-59%         stenosis of the left renal artery. LRV flow present.  Mesenteric:  Normal Celiac artery and Superior Mesenteric artery findings.     Patent IVC.    Lexiscan Myoview 11/07/2019: Nuclear stress EF: 60%. The left ventricular ejection fraction is normal (55-65%). There was no ST segment deviation noted during stress. The study is normal.   Echo 11/07/2019:  1. Left ventricular ejection fraction, by estimation, is 60 to 65%. The  left ventricle has normal function. The left ventricle has no regional  wall motion abnormalities. The left ventricular internal cavity size was  mildly dilated. There is mild left  ventricular hypertrophy. Left ventricular diastolic parameters are  consistent with Grade I diastolic dysfunction (impaired relaxation).   2. Right ventricular systolic function is  normal. The right ventricular  size is mildly enlarged.   3. Right atrial size was mildly dilated.   4. The mitral valve is normal in structure. Mild mitral valve  regurgitation. No evidence of mitral stenosis.   5. The aortic valve is tricuspid. Aortic valve regurgitation is not  visualized. Mild aortic valve sclerosis is present, with no evidence of  aortic valve stenosis.   6. Aortic dilatation noted. There is mild dilatation of the aortic root,  measuring 39 mm.   EKG: EKG ordered today. EKG performed today demonstrates NSR 62 bpm with stable TWI lateral leads.   Recent Labs: 04/15/2021: ALT 12 08/19/2021: BNP 69.2; Hemoglobin 13.5; Platelets 167 09/15/2021: BUN 15; Creatinine, Ser 1.37; Potassium 4.7; Sodium 142  Recent Lipid Panel    Component Value Date/Time   CHOL 131 04/15/2021 0948   TRIG 145 04/15/2021 0948   HDL 38 (L) 04/15/2021 0948   CHOLHDL 3.4 04/15/2021 0948   LDLCALC 68 04/15/2021 0948    Home Medications   Current Meds  Medication Sig   allopurinol (ZYLOPRIM) 300 MG tablet Take 1 tablet by mouth daily.   ammonium lactate (AMLACTIN) 12 % cream Apply topically.   aspirin EC 81 MG tablet Take 81 mg by mouth daily.   carvedilol (COREG) 12.5 MG tablet Take 1 tablet (12.5 mg total) by mouth 2 (two) times daily with a meal.   Cholecalciferol 50 MCG (2000 UT) TABS Take by mouth.   ciclopirox (PENLAC) 8 % solution Apply topically at bedtime. Apply over nail and surrounding skin. Apply daily over previous coat. After seven (7) days, may remove with alcohol and continue cycle.   clotrimazole-betamethasone (LOTRISONE) cream Apply 1 application topically 2 (two) times daily.   colchicine 0.6 MG tablet Take by mouth.   doxazosin (CARDURA) 2 MG tablet Take 1 tablet (2 mg total) by mouth at bedtime. Please take one '2mg'$  tablet daily in the evening   fluticasone (FLONASE) 50 MCG/ACT nasal spray 2 sprays daily as needed for allergies or rhinitis.   furosemide (LASIX) 40 MG tablet  TAKE 1 TABLET BY MOUTH DAILY AS NEEDED FOR SWELLING   hydrALAZINE (APRESOLINE) 25 MG tablet Take 1 tablet (25 mg total) by mouth in the morning and at bedtime.   montelukast (SINGULAIR) 10 MG tablet Take 1 tablet by mouth daily.   rosuvastatin (CRESTOR) 20 MG tablet Take 1 tablet (20 mg total) by mouth daily.   tamsulosin (FLOMAX) 0.4 MG CAPS capsule Take 0.4 mg by mouth daily.   [DISCONTINUED] amLODipine (NORVASC) 5 MG tablet TAKE 1 TABLET(5 MG) BY MOUTH AT  BEDTIME   [DISCONTINUED] dapagliflozin propanediol (FARXIGA) 10 MG TABS tablet Take 1 tablet (10 mg total) by mouth daily before breakfast.     Review of Systems      All other systems reviewed and are otherwise negative except as noted above.  Physical Exam    VS:  BP 134/76   Pulse 64   Ht '6\' 5"'$  (1.956 m)   Wt (!) 308 lb 14.4 oz (140.1 kg)   BMI 36.63 kg/m  , BMI Body mass index is 36.63 kg/m.  Wt Readings from Last 3 Encounters:  10/20/21 (!) 308 lb 14.4 oz (140.1 kg)  08/19/21 (!) 313 lb 9.6 oz (142.2 kg)  06/30/21 (!) 313 lb 12.8 oz (142.3 kg)    GEN: Well nourished, overweight, well developed, in no acute distress. HEENT: normal. Neck: Supple, no JVD, carotid bruits, or masses. Cardiac: RRR, no murmurs, rubs, or gallops. No clubbing, cyanosis, edema.  Radials/PT 2+ and equal bilaterally.  Respiratory:  Respirations regular and unlabored, clear to auscultation bilaterally. GI: Soft, nontender, nondistended. MS: No deformity or atrophy. Skin: Warm and dry, no rash. Neuro:  Strength and sensation are intact. Psych: Normal affect.  Assessment & Plan    HTN -BP slightly elevated 134/76. Discussed to monitor BP at home at least 2 hours after medications and sitting for 5-10 minutes. If BP consistently >130/80 recommend he call office. Continue current antihypertensive regimen.  Continue carvedilol 12.5 mg twice daily, hydralazine 25 mg twice daily, amlodipine 5 mg at night.    Obesity - Weight loss via diet and exercise  encouraged. Discussed the impact being overweight would have on cardiovascular risk. Could consider Wegovy in future, defer today due to shortage.  Renal artery stenosis - 01/2021 bilateral 1-59% stenosis. Consider repeat duplex in 1 year.   Chronic diastolic heart failure - Grossly euvolemic on exam. Weight down 5 lbs.  GDMT includes carvedilol, Hydralazine,  PRN Lasix. Low sodium diet, fluid restriction <2L, and daily weights encouraged. Educated to contact our office for weight gain of 2 lbs overnight or 5 lbs in one week. Hesitant to utilize MRA due to high normal potassium. Notes dark urine today, urinalysis ordered. Notes difficulty with urinary frequency on Farxiga. Reduce to '5mg'$  QD and check in in one week, if not improved will discontinue and consider Entresto.   Hyperlipidemia -continue rosuvastatin 20 mg daily. 04/15/21 LDL 68.   Disposition: Follow up in 3 months with Dr. Oval Linsey or Loel Dubonnet, NP   Signed, Loel Dubonnet, NP 10/20/2021, 1:26 PM [facility]

## 2021-10-21 ENCOUNTER — Telehealth (HOSPITAL_BASED_OUTPATIENT_CLINIC_OR_DEPARTMENT_OTHER): Payer: Self-pay

## 2021-10-21 LAB — URINALYSIS
Bilirubin, UA: NEGATIVE
Ketones, UA: NEGATIVE
Nitrite, UA: POSITIVE — AB
Protein,UA: NEGATIVE
RBC, UA: NEGATIVE
Specific Gravity, UA: >=1.03 — AB (ref 1.005–1.030)
Urobilinogen, Ur: 0.2 mg/dL (ref 0.2–1.0)
pH, UA: 6 (ref 5.0–7.5)

## 2021-10-21 MED ORDER — SULFAMETHOXAZOLE-TRIMETHOPRIM 800-160 MG PO TABS: 1.0000 | ORAL_TABLET | Freq: Two times a day (BID) | ORAL | 0 refills | Status: DC

## 2021-10-21 NOTE — Telephone Encounter (Signed)
Okay to still order abx.   If he is willing would be helpful for him to do a urine culture just to be sure infection is sensitive to Bactrim.  If he does not wish to provide repeat sample, can simply call his PCP in 5-7 days if symptoms not improved.  Loel Dubonnet, NP

## 2021-10-21 NOTE — Addendum Note (Signed)
Addended by: Gerald Stabs on: 10/21/2021 10:25 AM   Modules accepted: Orders

## 2021-10-21 NOTE — Telephone Encounter (Addendum)
Called [facility], they are unable to add on the complete urine culture, okay to still order abx? Will route to Laurann Montana, NP as update before ordering abx.    ----- Message from Loel Dubonnet, NP sent at 10/21/2021  7:58 AM EDT ----- Urinalysis reveals UTI. Please ask him stop stop [place].   Will begin empiric therapy, Bactrim DS BID x 5 days.   Can we please request [facility] complete urine culture? Will ensure alternate abx not needed.

## 2021-10-21 NOTE — Telephone Encounter (Signed)
Results called to patient who verbalizes understanding! Orders placed!     "Okay to still order abx.    If he is willing would be helpful for him to do a urine culture just to be sure infection is sensitive to Bactrim.  If he does not wish to provide repeat sample, can simply call his PCP in 5-7 days if symptoms not improved.   Loel Dubonnet, NP"

## 2021-10-27 ENCOUNTER — Telehealth (HOSPITAL_BASED_OUTPATIENT_CLINIC_OR_DEPARTMENT_OTHER): Payer: Self-pay

## 2021-10-27 NOTE — Telephone Encounter (Addendum)
Left message for patient to call back    ----- Message from Gerald Stabs, RN sent at 10/20/2021 11:45 AM EDT ----- Call in one week to see if urinary freq is better, if not better stop farxiga, get bp log to consider switch to [employer]

## 2021-10-28 NOTE — Telephone Encounter (Signed)
2nd call attempt to check on patient, no answer, left message to call  us back      ----- Message from Gerald Stabs, RN sent at 10/20/2021 11:45 AM EDT ----- Call in one week to see if urinary freq is better, if not better stop farxiga, get bp log to consider switch to [employer]

## 2021-10-29 NOTE — Telephone Encounter (Signed)
3rd call attempt, urinary frequency has resolved, NP notified.

## 2021-11-05 ENCOUNTER — Encounter: Payer: Self-pay | Admitting: Podiatry

## 2021-11-05 ENCOUNTER — Ambulatory Visit: Payer: Medicare PPO | Admitting: Podiatry

## 2021-11-05 DIAGNOSIS — B351 Tinea unguium: Secondary | ICD-10-CM | POA: Diagnosis not present

## 2021-11-05 DIAGNOSIS — M79674 Pain in right toe(s): Secondary | ICD-10-CM

## 2021-11-05 DIAGNOSIS — M79675 Pain in left toe(s): Secondary | ICD-10-CM

## 2021-11-05 NOTE — Progress Notes (Signed)
This patient presents to the office with chief complaint of long thick painful nails.  Patient says the nails are painful walking and wearing shoes.  This patient is unable to self treat.  This patient is unable to trim his nails since she is unable to reach his nails. Patient has tried numerous fungal treatments unsuccessfully She presents to the office for preventative foot care services.  General Appearance  Alert, conversant and in no acute stress.  Vascular  Dorsalis pedis and posterior tibial  pulses are palpable  bilaterally.  Capillary return is within normal limits  bilaterally. Temperature is within normal limits  bilaterally.  Neurologic  Senn-Weinstein monofilament wire test within normal limits  bilaterally. Muscle power within normal limits bilaterally.  Nails Thick disfigured discolored nails with subungual debris  from hallux to fifth toes bilaterally. No evidence of bacterial infection or drainage bilaterally.  Orthopedic  No limitations of motion  feet .  No crepitus or effusions noted.  No bony pathology or digital deformities noted.  Skin  normotropic skin with no porokeratosis noted bilaterally.  No signs of infections or ulcers noted.     Onychomycosis  Nails  B/L.  Pain in right toes  Pain in left toes  Debridement of nails both feet followed trimming the nails with dremel tool.  Patient says he is having swelling and pain in right ankle.  Told him to make an appointment with Dr.  Posey Pronto.  RTC 3 months.   Gardiner Barefoot DPM

## 2021-11-12 ENCOUNTER — Ambulatory Visit: Payer: Medicare PPO | Admitting: Podiatry

## 2021-11-12 DIAGNOSIS — M7751 Other enthesopathy of right foot: Secondary | ICD-10-CM | POA: Diagnosis not present

## 2021-11-12 DIAGNOSIS — M7752 Other enthesopathy of left foot: Secondary | ICD-10-CM

## 2021-11-12 NOTE — Progress Notes (Signed)
Subjective:  Patient ID: GWEN EDLER, male    DOB: 1940/11/09,  MRN: [id number]  Chief Complaint  Patient presents with   Foot Pain    Pt stated that he has had issues with gout in the past and has noticed some swelling in the ankles denies any injuries at this time     81 y.o. male presents with the above complaint.  Patient presents with complaint bilateral ankle pain has been on for quite some time he is noted some swelling and pain associate with it.  He has not seen and was prior to seeing me he would like to discuss issues with it.  He states it hurts with ambulation hurts in the lateral side of the ankle there is no instability has not fallen down pain scale is 5 out of 10 hurts with ambulation.   Review of Systems: Negative except as noted in the HPI. Denies N/V/F/Ch.  Past Medical History:  Diagnosis Date   Acute on chronic diastolic heart failure ([place]) 12/22/2019   Allergy    sesonal   Complication of anesthesia    HX OF ENLARGED PROSTATE AND UNABLE TO PASS WATER AFTER ANESTHESIA   Dyspnea    with some exertion   Dysrhythmia    HX ATRIAL FIBRILLATION - HAD ABLATION - NO LONGER HAS AF.   Essential hypertension 12/22/2019   Essential hypertension, malignant    Exertional dyspnea 01/27/2021   Gout, unspecified    Lower extremity edema 12/22/2019   Obesity, mild    OSA (obstructive sleep apnea)    Mild - PT STATES HE DID NOT HAVE TO USE CPAP   Osteoarthritis    Osteoarthrosis, unspecified whether generalized or localized, unspecified site    Other dyspnea and respiratory abnormality    Prostate enlargement    DR. NESI IS PT'S UROLOGIST   Pure hypercholesterolemia    Pure hypercholesterolemia 01/27/2021   Renal insufficiency, mild    PT STATES NOT AWARE OF ANY KIDNEY PROBLEM    Current Outpatient Medications:    allopurinol (ZYLOPRIM) 300 MG tablet, Take 1 tablet by mouth daily., Disp: , Rfl:    amLODipine (NORVASC) 5 MG tablet, TAKE 1 TABLET(5 MG) BY MOUTH AT  BEDTIME, Disp: 90 tablet, Rfl: 3   ammonium lactate (AMLACTIN) 12 % cream, Apply topically., Disp: , Rfl:    aspirin EC 81 MG tablet, Take 81 mg by mouth daily., Disp: , Rfl:    carvedilol (COREG) 12.5 MG tablet, Take 1 tablet (12.5 mg total) by mouth 2 (two) times daily with a meal., Disp: 180 tablet, Rfl: 3   Cholecalciferol 50 MCG (2000 UT) TABS, Take by mouth., Disp: , Rfl:    ciclopirox (PENLAC) 8 % solution, Apply topically at bedtime. Apply over nail and surrounding skin. Apply daily over previous coat. After seven (7) days, may remove with alcohol and continue cycle., Disp: 6.6 mL, Rfl: 0   clotrimazole-betamethasone (LOTRISONE) cream, Apply 1 application topically 2 (two) times daily., Disp: 30 g, Rfl: 0   colchicine 0.6 MG tablet, Take by mouth., Disp: , Rfl:    doxazosin (CARDURA) 2 MG tablet, Take 1 tablet (2 mg total) by mouth at bedtime. Please take one '2mg'$  tablet daily in the evening, Disp: 90 tablet, Rfl: 3   fluticasone (FLONASE) 50 MCG/ACT nasal spray, 2 sprays daily as needed for allergies or rhinitis., Disp: , Rfl:    furosemide (LASIX) 40 MG tablet, TAKE 1 TABLET BY MOUTH DAILY AS NEEDED FOR SWELLING, Disp: 90 tablet,  Rfl: 3   hydrALAZINE (APRESOLINE) 25 MG tablet, Take 1 tablet (25 mg total) by mouth in the morning and at bedtime., Disp: 180 tablet, Rfl: 3   montelukast (SINGULAIR) 10 MG tablet, Take 1 tablet by mouth daily., Disp: , Rfl:    rosuvastatin (CRESTOR) 20 MG tablet, Take 1 tablet (20 mg total) by mouth daily., Disp: 90 tablet, Rfl: 3   sulfamethoxazole-trimethoprim (BACTRIM DS) 800-160 MG tablet, Take 1 tablet by mouth 2 (two) times daily., Disp: 10 tablet, Rfl: 0   tamsulosin (FLOMAX) 0.4 MG CAPS capsule, Take 0.4 mg by mouth daily., Disp: , Rfl:   Social History   Tobacco Use  Smoking Status Never  Smokeless Tobacco Never    Allergies  Allergen Reactions   Penicillins Rash   Objective:  There were no vitals filed for this visit. There is no height or  weight on file to calculate BMI. Constitutional Well developed. Well nourished.  Vascular Dorsalis pedis pulses palpable bilaterally. Posterior tibial pulses palpable bilaterally. Capillary refill normal to all digits.  No cyanosis or clubbing noted. Pedal hair growth normal.  Neurologic Normal speech. Oriented to person, place, and time. Epicritic sensation to light touch grossly present bilaterally.  Dermatologic Nails well groomed and normal in appearance. No open wounds. No skin lesions.  Orthopedic: Pain on palpation of bilateral lateral gutter of the ankle joint.  Pain with range of motion of the ankle joint.  No deep intra-articular ankle pain noted.  No pain at the Achilles tendon peroneal tendon posterior tibial tendon ATFL ligament   Radiographs: None Assessment:   1. Capsulitis of right ankle   2. Capsulitis of left ankle    Plan:  Patient was evaluated and treated and all questions answered.  Bilateral ankle capsulitis -All questions and concerns were discussed with the patient in extensive detail -I explained the patient the etiology of ankle pain and various treatment options were discussed.  Given the amount of pain that he is having a benefit of steroid injection to bilateral ankle joint. -A steroid injection was performed at bilateral ankle using 1% plain Lidocaine and 10 mg of Kenalog. This was well tolerated.   No follow-ups on file.

## 2022-01-19 ENCOUNTER — Ambulatory Visit (HOSPITAL_BASED_OUTPATIENT_CLINIC_OR_DEPARTMENT_OTHER): Payer: Medicare PPO | Admitting: Family

## 2022-01-19 ENCOUNTER — Encounter (HOSPITAL_BASED_OUTPATIENT_CLINIC_OR_DEPARTMENT_OTHER): Payer: Self-pay | Admitting: Family

## 2022-01-19 VITALS — BP 130/68 | HR 70 | Ht 77.0 in | Wt 315.0 lb

## 2022-01-19 DIAGNOSIS — I701 Atherosclerosis of renal artery: Secondary | ICD-10-CM | POA: Diagnosis not present

## 2022-01-19 DIAGNOSIS — I5032 Chronic diastolic (congestive) heart failure: Secondary | ICD-10-CM | POA: Diagnosis not present

## 2022-01-19 DIAGNOSIS — E782 Mixed hyperlipidemia: Secondary | ICD-10-CM | POA: Diagnosis not present

## 2022-01-19 DIAGNOSIS — I10 Essential (primary) hypertension: Secondary | ICD-10-CM | POA: Diagnosis not present

## 2022-01-19 NOTE — Patient Instructions (Signed)
Medication Instructions:  Your physician has recommended you make the following change in your medication:   Take fluid pill (Furosemide) once a daily for 2-3 days then as needed.  *If you need a refill on your cardiac medications before your next appointment, please call your pharmacy*   Lab Work/Testing/Procedures: Renal ultrasound as scheduled.    Follow-Up: At [facility], you and your health needs are our priority.  As part of our continuing mission to provide you with exceptional heart care, we have created designated Provider Care Teams.  These Care Teams include your primary Cardiologist (physician) and Advanced Practice Providers (APPs -  Physician Assistants and Nurse Practitioners) who all work together to provide you with the care you need, when you need it.  We recommend signing up for the patient portal called "MyChart".  Sign up information is provided on this After Visit Summary.  MyChart is used to connect with patients for Virtual Visits (Telemedicine).  Patients are able to view lab/test results, encounter notes, upcoming appointments, etc.  Non-urgent messages can be sent to your provider as well.   To learn more about what you can do with MyChart, go to [email or web address].    Your next appointment:   6 month(s)  The format for your next appointment:   In Person  Provider:   Skeet Latch, MD or Laurann Montana, NP    Other Instructions  Exercises to do While Sitting Exercises that you do while sitting (chair exercises) can give you many of the same benefits as full exercise. Benefits include strengthening your heart, burning calories, and keeping muscles and joints healthy. Exercise can also improve your mood and help with depression and anxiety. You may benefit from chair exercises if you are unable to do standing exercises due to: Diabetic foot pain. Obesity. Illness. Arthritis. Recovery from surgery or injury. Breathing problems. Balance  problems. Another type of disability. Before starting chair exercises, check with your health care provider or a physical therapist to find out how much exercise you can tolerate and which exercises are safe for you. If your health care provider approves: Start out slowly and build up over time. Aim to work up to about 10-20 minutes for each exercise session. Make exercise part of your daily routine. Drink water when you exercise. Do not wait until you are thirsty. Drink every 10-15 minutes. Stop exercising right away if you have pain, nausea, shortness of breath, or dizziness. If you are exercising in a wheelchair, make sure to lock the wheels. Ask your health care provider whether you can do tai chi or yoga. Many positions in these mind-body exercises can be modified to do while seated. Warm-up Before starting other exercises: Sit up as straight as you can. Have your knees bent at 90 degrees, which is the shape of the capital letter "L." Keep your feet flat on the floor. Sit at the front edge of your chair, if you can. Pull in (tighten) the muscles in your abdomen and stretch your spine and neck as straight as you can. Hold this position for a few minutes. Breathe in and out evenly. Try to concentrate on your breathing, and relax your mind. Stretching Exercise A: Arm stretch Hold your arms out straight in front of your body. Bend your hands at the wrist with your fingers pointing up, as if signaling someone to stop. Notice the slight tension in your forearms as you hold the position. Keeping your arms out and your hands bent, rotate your hands  outward as far as you can and hold this stretch. Aim to have your thumbs pointing up and your pinkie fingers pointing down. Slowly repeat arm stretches for one minute as tolerated. Exercise B: Leg stretch If you can move your legs, try to "draw" letters on the floor with the toes of your foot. Write your name with one foot. Write your name with the  toes of your other foot. Slowly repeat the movements for one minute as tolerated. Exercise C: Reach for the sky Reach your hands as far over your head as you can to stretch your spine. Move your hands and arms as if you are climbing a rope. Slowly repeat the movements for one minute as tolerated. Range of motion exercises Exercise A: Shoulder roll Let your arms hang loosely at your sides. Lift just your shoulders up toward your ears, then let them relax back down. When your shoulders feel loose, rotate your shoulders in backward and forward circles. Do shoulder rolls slowly for one minute as tolerated. Exercise B: March in place As if you are marching, pump your arms and lift your legs up and down. Lift your knees as high as you can. If you are unable to lift your knees, just pump your arms and move your ankles and feet up and down. March in place for one minute as tolerated. Exercise C: Seated jumping jacks Let your arms hang down straight. Keeping your arms straight, lift them up over your head. Aim to point your fingers to the ceiling. While you lift your arms, straighten your legs and slide your heels along the floor to your sides, as wide as you can. As you bring your arms back down to your sides, slide your legs back together. If you are unable to use your legs, just move your arms. Slowly repeat seated jumping jacks for one minute as tolerated. Strengthening exercises Exercise A: Shoulder squeeze Hold your arms straight out from your body to your sides, with your elbows bent and your fists pointed at the ceiling. Keeping your arms in the bent position, move them forward so your elbows and forearms meet in front of your face. Open your arms back out as wide as you can with your elbows still bent, until you feel your shoulder blades squeezing together. Hold for 5 seconds. Slowly repeat the movements forward and backward for one minute as tolerated. Contact a health care provider  if: You have to stop exercising due to any of the following: Pain. Nausea. Shortness of breath. Dizziness. Fatigue. You have significant pain or soreness after exercising. Get help right away if: You have chest pain. You have difficulty breathing. These symptoms may represent a serious problem that is an emergency. Do not wait to see if the symptoms will go away. Get medical help right away. Call your local emergency services (911 in the [place] Do not drive yourself to the hospital. Summary Exercises that you do while sitting (chair exercises) can strengthen your heart, burn calories, and keep muscles and joints healthy. You may benefit from chair exercises if you are unable to do standing exercises due to diabetic foot pain, obesity, recovery from surgery or injury, or other conditions. Before starting chair exercises, check with your health care provider or a physical therapist to find out how much exercise you can tolerate and which exercises are safe for you. This information is not intended to replace advice given to you by your health care provider. Make sure you discuss any questions you  have with your health care provider. Document Revised: 03/24/2020 Document Reviewed: 03/24/2020 Elsevier Patient Education  [employer].

## 2022-01-19 NOTE — Progress Notes (Signed)
Office Visit    Patient Name: Pedro Lee Date of Encounter: 01/19/2022  PCP:  Rogers Blocker, MD   [place]  Cardiologist:  Sinclair Grooms, MD / Dr. Oval Linsey ADV HTN Advanced Practice Provider:  No care team member to display Electrophysiologist:  None   Chief Complaint    Pedro Lee is a 81 y.o. male presents today for heart failure follow-up  Past Medical History    Past Medical History:  Diagnosis Date   Acute on chronic diastolic heart failure ([place]) 12/22/2019   Allergy    sesonal   Complication of anesthesia    HX OF ENLARGED PROSTATE AND UNABLE TO PASS WATER AFTER ANESTHESIA   Dyspnea    with some exertion   Dysrhythmia    HX ATRIAL FIBRILLATION - HAD ABLATION - NO LONGER HAS AF.   Essential hypertension 12/22/2019   Essential hypertension, malignant    Exertional dyspnea 01/27/2021   Gout, unspecified    Lower extremity edema 12/22/2019   Obesity, mild    OSA (obstructive sleep apnea)    Mild - PT STATES HE DID NOT HAVE TO USE CPAP   Osteoarthritis    Osteoarthrosis, unspecified whether generalized or localized, unspecified site    Other dyspnea and respiratory abnormality    Prostate enlargement    DR. NESI IS PT'S UROLOGIST   Pure hypercholesterolemia    Pure hypercholesterolemia 01/27/2021   Renal insufficiency, mild    PT STATES NOT AWARE OF ANY KIDNEY PROBLEM   Past Surgical History:  Procedure Laterality Date   COLONOSCOPY     CRANIOTOMY N/A 03/24/2016   Procedure: Transphenoidal Resection of Tumor;  Surgeon: Earnie Larsson, MD;  Location: [facility];  Service: Neurosurgery;  Laterality: N/A;  Transphenoidal Resection of Tumor   HEART ABLATION FOR AF  1998   JOINT REPLACEMENT     LEFT TOTAL KNEE REPLACEMENT   KNEE ARTHROPLASTY Left 2013ish   LEFT KNEE ARTHROSCOPY     TOTAL KNEE ARTHROPLASTY Right 05/30/2013   Procedure: RIGHT TOTAL KNEE ARTHROPLASTY;  Surgeon: Mauri Pole, MD;  Location: [facility];  Service:  Orthopedics;  Laterality: Right;   TRANSNASAL APPROACH N/A 03/24/2016   Procedure: TRANSNASAL APPROACH;  Surgeon: Rozetta Nunnery, MD;  Location: [facility];  Service: ENT;  Laterality: N/A;  TRANSNASAL APPROACH    Allergies  Allergies  Allergen Reactions   Penicillins Rash    History of Present Illness    Pedro Lee is a 81 y.o. 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  He was seen by Dr. Tamala Julian in 2019 for exertional dyspnea thought to be due to deconditioning, hypertension, obesity.  He was seen 10/2019 in follow-up and noted to have elevated blood pressure-his PCP had reduced doxazosin due to low blood pressure.  Lexiscan Myoview 10/2019 negative for ischemia with normal LVEF.  When seen 11/2019 his doxazosin was increased to 2 mg twice daily.  Virtual follow-up 11/2019 with BP elevated in the evenings and doxazosin further increase.  He was referred to advanced hypertension clinic.  He lost his wife of 67 years May 2021 and has had understandable difficulty since that time.  ABIs 01/2020 in the setting of leg pain within normal limits.  Symptoms thought to be due to venous insufficiency and provided as needed furosemide.  02/2020 and hydralazine added for hypertension.  On 03/2020 he had increased lower extremity edema but was not using furosemide.  He was confused regarding his medications and carvedilol, doxazosin increased.  Hydralazine stopped and started on amlodipine.  Repeat renal artery Dopplers 01/27/2021 with mild to moderate blockage bilaterally.  Amlodipine stopped due to lower extremity edema and hydralazine increased.  Doxazosin reduced due to hypotension. AAA duplex 01/2021 with no evidence of abdominal aortic aneurysm (largest measurement 2.9cm).  Seen in clinic 01/27/2021 Lasix increased to 40 mg daily  for 3 days. Spoke with pharmacist 05/22/2021 with BP 112/62.  He was no longer taking doxazosin.  He was recommended to reduce his carvedilol to half tablet.  Evening doxazosin was resumed as BP elevated in the morning after 148/85.  Follow-up 05/2021 lightheadedness improved since reduced dose of carvedilol.  Edema controlled with compression socks.  Was participating in [place].  Seen 08/2021 with repeat echo performed 09/02/21 EF 65-70%, no RWMA, moderate LVH, gr1DD, RV moderately enlarged, mildly elevated PASP, mild dilation ascending aorta 14m. FWilder Gladeinitiated however had to later be discontinued due to UTI.  Presents today for follow-up independently. Has scale at home but not weighing daily.  Has BP cuff but has not been checking routinely at home.  Has been doing a lot of traveling to visit family.  Notes having difficulties with his arches and concerns for balance which is followed by podiatry as well as primary care.  Has new arch inserts which have been helping.  [facility]  Notes some numbness in his left arm when he lays in certain positions and discussed likely etiology of nerve pinched and encouraged to discuss with primary care provider.  Lower extremity edema slightly worse though has not taking his Lasix recently.  Exertional dyspnea overall mild and unchanged.  No chest pain, orthopnea, PND, palpitations, syncope.  EKGs/Labs/Other Studies Reviewed:   The following studies were reviewed today:  Echo 08/2021  1. Left ventricular ejection fraction, by estimation, is 65 to 70%. The  left ventricle has normal function. The left ventricle has no regional  wall motion abnormalities. There is moderate concentric left ventricular  hypertrophy. Left ventricular  diastolic parameters are consistent with Grade I diastolic dysfunction  (impaired relaxation).   2. Right ventricular systolic function is normal. The right ventricular  size is moderately enlarged. There is  mildly elevated pulmonary artery  systolic pressure. The estimated right ventricular systolic pressure is  451.7mmHg.   3. Left atrial size was mildly dilated.   4. Right atrial size was mildly dilated.   5. The mitral valve is normal in structure. Trivial mitral valve  regurgitation. No evidence of mitral stenosis.   6. The aortic valve is tricuspid. Aortic valve regurgitation is not  visualized.   7. Aortic dilatation noted. There is mild dilatation of the ascending  aorta, measuring 41 mm.   Comparison(s): Prior images reviewed side by side. Increase in aortic  size.   Bilateral Renal Artery Doppler 01/27/2021 (Preliminary Results): Summary:  Largest Aortic Diameter: 2.9 cm, upper limits of normal     Renal:     Right: Abnormal size for the right kidney. Normal right Resistive         Index. Normal cortical thickness of right kidney. 1-59%         stenosis of the right renal artery. RRV flow present.  Left:  Abnormal size for the left kidney. Normal left Resistive  Index. Normal cortical thickness of the left kidney. 1-59%         stenosis of the left renal artery. LRV flow present.  Mesenteric:  Normal Celiac artery and Superior Mesenteric artery findings.     Patent IVC.    Lexiscan Myoview 11/07/2019: Nuclear stress EF: 60%. The left ventricular ejection fraction is normal (55-65%). There was no ST segment deviation noted during stress. The study is normal.   Echo 11/07/2019:  1. Left ventricular ejection fraction, by estimation, is 60 to 65%. The  left ventricle has normal function. The left ventricle has no regional  wall motion abnormalities. The left ventricular internal cavity size was  mildly dilated. There is mild left  ventricular hypertrophy. Left ventricular diastolic parameters are  consistent with Grade I diastolic dysfunction (impaired relaxation).   2. Right ventricular systolic function is normal. The right ventricular  size is mildly enlarged.    3. Right atrial size was mildly dilated.   4. The mitral valve is normal in structure. Mild mitral valve  regurgitation. No evidence of mitral stenosis.   5. The aortic valve is tricuspid. Aortic valve regurgitation is not  visualized. Mild aortic valve sclerosis is present, with no evidence of  aortic valve stenosis.   6. Aortic dilatation noted. There is mild dilatation of the aortic root,  measuring 39 mm.   EKG: EKG not ordered today.   Recent Labs: 04/15/2021: ALT 12 08/19/2021: BNP 69.2; Hemoglobin 13.5; Platelets 167 09/15/2021: BUN 15; Creatinine, Ser 1.37; Potassium 4.7; Sodium 142  Recent Lipid Panel    Component Value Date/Time   CHOL 131 04/15/2021 0948   TRIG 145 04/15/2021 0948   HDL 38 (L) 04/15/2021 0948   CHOLHDL 3.4 04/15/2021 0948   LDLCALC 68 04/15/2021 0948    Home Medications   Current Meds  Medication Sig   allopurinol (ZYLOPRIM) 300 MG tablet Take 1 tablet by mouth daily.   amLODipine (NORVASC) 5 MG tablet TAKE 1 TABLET(5 MG) BY MOUTH AT BEDTIME   ammonium lactate (AMLACTIN) 12 % cream Apply topically.   aspirin EC 81 MG tablet Take 81 mg by mouth daily.   carvedilol (COREG) 12.5 MG tablet Take 1 tablet (12.5 mg total) by mouth 2 (two) times daily with a meal.   Cholecalciferol 50 MCG (2000 UT) TABS Take by mouth.   ciclopirox (PENLAC) 8 % solution Apply topically at bedtime. Apply over nail and surrounding skin. Apply daily over previous coat. After seven (7) days, may remove with alcohol and continue cycle.   clotrimazole-betamethasone (LOTRISONE) cream Apply 1 application topically 2 (two) times daily.   colchicine 0.6 MG tablet Take by mouth.   doxazosin (CARDURA) 2 MG tablet Take 1 tablet (2 mg total) by mouth at bedtime. Please take one '2mg'$  tablet daily in the evening   fluticasone (FLONASE) 50 MCG/ACT nasal spray 2 sprays daily as needed for allergies or rhinitis.   furosemide (LASIX) 40 MG tablet TAKE 1 TABLET BY MOUTH DAILY AS NEEDED FOR SWELLING    hydrALAZINE (APRESOLINE) 25 MG tablet Take 1 tablet (25 mg total) by mouth in the morning and at bedtime.   montelukast (SINGULAIR) 10 MG tablet Take 1 tablet by mouth daily.   rosuvastatin (CRESTOR) 20 MG tablet Take 1 tablet (20 mg total) by mouth daily.   sulfamethoxazole-trimethoprim (BACTRIM DS) 800-160 MG tablet Take 1 tablet by mouth 2 (two) times daily.   tamsulosin (FLOMAX) 0.4 MG CAPS capsule Take 0.4 mg by mouth daily.  Review of Systems      All other systems reviewed and are otherwise negative except as noted above.  Physical Exam    VS:  BP 130/68 (BP Location: Left Arm, Patient Position: Sitting, Cuff Size: Large)   Pulse 70   Ht '6\' 5"'$  (1.956 m)   Wt (!) 315 lb (142.9 kg)   BMI 37.35 kg/m  , BMI Body mass index is 37.35 kg/m.  Wt Readings from Last 3 Encounters:  01/19/22 (!) 315 lb (142.9 kg)  10/20/21 (!) 308 lb 14.4 oz (140.1 kg)  08/19/21 (!) 313 lb 9.6 oz (142.2 kg)    GEN: Well nourished, overweight, well developed, in no acute distress. HEENT: normal. Neck: Supple, no JVD, carotid bruits, or masses. Cardiac: RRR, no murmurs, rubs, or gallops. No clubbing, cyanosis, edema.  Radials/PT 2+ and equal bilaterally.  Respiratory:  Respirations regular and unlabored, clear to auscultation bilaterally. GI: Soft, nontender, nondistended. MS: No deformity or atrophy. Skin: Warm and dry, no rash. Neuro:  Strength and sensation are intact. Psych: Normal affect.  Assessment & Plan    HTN -initial BP 144/82 and repeat without intervention 130/68.  Continue present antihypertensive regimen.  Discussed to monitor BP at home at least 2 hours after medications and sitting for 5-10 minutes. If BP consistently >130/80 recommend he call office. Continue carvedilol 12.5 mg twice daily, hydralazine 25 mg twice daily, amlodipine 5 mg at night.    Obesity - Weight loss via diet and exercise encouraged. Discussed the impact being overweight would have on cardiovascular risk.   Encouraged to resume exercising at the [facility].  Renal artery stenosis - 01/2021 bilateral 1-59% stenosis.  Repeat duplex echo me later this month.  Chronic diastolic heart failure -weight up 7 pounds and increased lower extremity edema.  Likely due to not using his as needed Lasix and dietary indiscretion with travel.  He will take his Lasix 40 mg daily for 3 days then return to as needed.  Did not tolerate Farxiga due to UTI.Marland KitchenContinue GDMT carvedilol, Hydralazine,  PRN Lasix. Low sodium diet, fluid restriction <2L, and daily weights encouraged. Educated to contact our office for weight gain of 2 lbs overnight or 5 lbs in one week. Hesitant to utilize MRA due to high normal potassium.  Hyperlipidemia -continue rosuvastatin 20 mg daily. 04/15/21 LDL 68.   Disposition: Follow up in 6 months with Dr. Oval Linsey or Loel Dubonnet, NP   Signed, Loel Dubonnet, NP 01/19/2022, 1:11 PM [facility]

## 2022-01-27 ENCOUNTER — Ambulatory Visit (HOSPITAL_COMMUNITY)
Admission: RE | Admit: 2022-01-27 | Discharge: 2022-01-27 | Disposition: A | Discharge status: Home / Self Care | Payer: Medicare PPO | Source: Ambulatory Visit | Attending: Cardiology | Admitting: Cardiology

## 2022-01-27 DIAGNOSIS — I701 Atherosclerosis of renal artery: Secondary | ICD-10-CM

## 2022-02-06 ENCOUNTER — Ambulatory Visit: Payer: Medicare PPO | Admitting: Podiatry

## 2022-02-12 ENCOUNTER — Other Ambulatory Visit: Payer: Self-pay | Admitting: Internal Medicine

## 2022-02-12 ENCOUNTER — Ambulatory Visit
Admission: RE | Admit: 2022-02-12 | Discharge: 2022-02-12 | Disposition: A | Discharge status: Home / Self Care | Payer: Medicare PPO | Source: Ambulatory Visit | Attending: Internal Medicine | Admitting: Internal Medicine

## 2022-02-12 DIAGNOSIS — M25472 Effusion, left ankle: Secondary | ICD-10-CM

## 2022-02-12 DIAGNOSIS — M25571 Pain in right ankle and joints of right foot: Secondary | ICD-10-CM

## 2022-07-07 ENCOUNTER — Telehealth (HOSPITAL_BASED_OUTPATIENT_CLINIC_OR_DEPARTMENT_OTHER): Payer: Self-pay

## 2022-07-07 MED ORDER — CARVEDILOL 12.5 MG PO TABS: 12.5000 mg | ORAL_TABLET | Freq: Two times a day (BID) | ORAL | 3 refills | Status: DC

## 2022-07-07 NOTE — Telephone Encounter (Signed)
Received fax for refill, rx sent to pharmacy on file

## 2022-07-15 ENCOUNTER — Other Ambulatory Visit (HOSPITAL_BASED_OUTPATIENT_CLINIC_OR_DEPARTMENT_OTHER): Payer: Self-pay | Admitting: Cardiovascular Disease

## 2022-07-15 NOTE — Telephone Encounter (Signed)
Rx request sent to pharmacy.  

## 2022-07-31 ENCOUNTER — Inpatient Hospital Stay (HOSPITAL_COMMUNITY)
Admission: EM | Admit: 2022-07-31 | Discharge: 2022-08-03 | DRG: 504 | Disposition: A | Discharge status: Home Health | Payer: Medicare PPO | Attending: Family Medicine | Admitting: Family Medicine

## 2022-07-31 ENCOUNTER — Emergency Department (HOSPITAL_COMMUNITY): Payer: Medicare PPO

## 2022-07-31 ENCOUNTER — Other Ambulatory Visit: Payer: Self-pay

## 2022-07-31 DIAGNOSIS — I5032 Chronic diastolic (congestive) heart failure: Secondary | ICD-10-CM | POA: Diagnosis present

## 2022-07-31 DIAGNOSIS — L97521 Non-pressure chronic ulcer of other part of left foot limited to breakdown of skin: Secondary | ICD-10-CM | POA: Diagnosis present

## 2022-07-31 DIAGNOSIS — L03116 Cellulitis of left lower limb: Secondary | ICD-10-CM | POA: Diagnosis present

## 2022-07-31 DIAGNOSIS — Z96653 Presence of artificial knee joint, bilateral: Secondary | ICD-10-CM | POA: Diagnosis present

## 2022-07-31 DIAGNOSIS — E7800 Pure hypercholesterolemia, unspecified: Secondary | ICD-10-CM | POA: Diagnosis present

## 2022-07-31 DIAGNOSIS — M86172 Other acute osteomyelitis, left ankle and foot: Principal | ICD-10-CM

## 2022-07-31 DIAGNOSIS — M869 Osteomyelitis, unspecified: Secondary | ICD-10-CM | POA: Diagnosis present

## 2022-07-31 DIAGNOSIS — I130 Hypertensive heart and chronic kidney disease with heart failure and stage 1 through stage 4 chronic kidney disease, or unspecified chronic kidney disease: Secondary | ICD-10-CM | POA: Diagnosis present

## 2022-07-31 DIAGNOSIS — L03032 Cellulitis of left toe: Secondary | ICD-10-CM | POA: Diagnosis present

## 2022-07-31 DIAGNOSIS — N1831 Chronic kidney disease, stage 3a: Secondary | ICD-10-CM | POA: Diagnosis present

## 2022-07-31 DIAGNOSIS — I7090 Unspecified atherosclerosis: Secondary | ICD-10-CM | POA: Diagnosis not present

## 2022-07-31 DIAGNOSIS — M109 Gout, unspecified: Secondary | ICD-10-CM | POA: Diagnosis present

## 2022-07-31 DIAGNOSIS — G4733 Obstructive sleep apnea (adult) (pediatric): Secondary | ICD-10-CM | POA: Diagnosis present

## 2022-07-31 DIAGNOSIS — J302 Other seasonal allergic rhinitis: Secondary | ICD-10-CM | POA: Diagnosis present

## 2022-07-31 DIAGNOSIS — E785 Hyperlipidemia, unspecified: Secondary | ICD-10-CM | POA: Diagnosis present

## 2022-07-31 DIAGNOSIS — M1990 Unspecified osteoarthritis, unspecified site: Secondary | ICD-10-CM | POA: Diagnosis present

## 2022-07-31 DIAGNOSIS — Z7982 Long term (current) use of aspirin: Secondary | ICD-10-CM

## 2022-07-31 DIAGNOSIS — I10 Essential (primary) hypertension: Secondary | ICD-10-CM | POA: Diagnosis present

## 2022-07-31 DIAGNOSIS — Z6837 Body mass index (BMI) 37.0-37.9, adult: Secondary | ICD-10-CM

## 2022-07-31 DIAGNOSIS — Z880 Allergy status to penicillin: Secondary | ICD-10-CM | POA: Diagnosis not present

## 2022-07-31 DIAGNOSIS — E669 Obesity, unspecified: Secondary | ICD-10-CM | POA: Diagnosis present

## 2022-07-31 DIAGNOSIS — Z79899 Other long term (current) drug therapy: Secondary | ICD-10-CM

## 2022-07-31 DIAGNOSIS — Z8249 Family history of ischemic heart disease and other diseases of the circulatory system: Secondary | ICD-10-CM

## 2022-07-31 DIAGNOSIS — N400 Enlarged prostate without lower urinary tract symptoms: Secondary | ICD-10-CM | POA: Diagnosis present

## 2022-07-31 LAB — COMPREHENSIVE METABOLIC PANEL
ALT: 16 U/L (ref 0–44)
AST: 19 U/L (ref 15–41)
Albumin: 3.8 g/dL (ref 3.5–5.0)
Alkaline Phosphatase: 50 U/L (ref 38–126)
Anion gap: 8 (ref 5–15)
BUN: 15 mg/dL (ref 8–23)
CO2: 26 mmol/L (ref 22–32)
Calcium: 9.2 mg/dL (ref 8.9–10.3)
Chloride: 106 mmol/L (ref 98–111)
Creatinine, Ser: 1.34 mg/dL — ABNORMAL HIGH (ref 0.61–1.24)
GFR, Estimated: 53 mL/min — ABNORMAL LOW (ref >60)
Glucose, Bld: 103 mg/dL — ABNORMAL HIGH (ref 70–99)
Potassium: 4.2 mmol/L (ref 3.5–5.1)
Sodium: 140 mmol/L (ref 135–145)
Total Bilirubin: 0.6 mg/dL (ref 0.3–1.2)
Total Protein: 7 g/dL (ref 6.5–8.1)

## 2022-07-31 LAB — C-REACTIVE PROTEIN: CRP: 0.6 mg/dL (ref <1.0)

## 2022-07-31 LAB — CBC
HCT: 44.6 % (ref 39.0–52.0)
Hemoglobin: 14 g/dL (ref 13.0–17.0)
MCH: 27.9 pg (ref 26.0–34.0)
MCHC: 31.4 g/dL (ref 30.0–36.0)
MCV: 88.8 fL (ref 80.0–100.0)
Platelets: 199 10*3/uL (ref 150–400)
RBC: 5.02 MIL/uL (ref 4.22–5.81)
RDW: 14.5 % (ref 11.5–15.5)
WBC: 7.4 10*3/uL (ref 4.0–10.5)
nRBC: 0 % (ref 0.0–0.2)

## 2022-07-31 LAB — SEDIMENTATION RATE: Sed Rate: 5 mm/hr (ref 0–16)

## 2022-07-31 MED ORDER — GADOBUTROL 1 MMOL/ML IV SOLN
10.0000 mL | Freq: Once | INTRAVENOUS | Status: AC | PRN
  Administered 2022-07-31: 10 mL via INTRAVENOUS

## 2022-07-31 MED ORDER — ACETAMINOPHEN 325 MG PO TABS: 650.0000 mg | ORAL_TABLET | Freq: Four times a day (QID) | ORAL | Status: DC | PRN

## 2022-07-31 MED ORDER — ONDANSETRON HCL 4 MG PO TABS: 4.0000 mg | ORAL_TABLET | Freq: Four times a day (QID) | ORAL | Status: DC | PRN

## 2022-07-31 MED ORDER — SENNOSIDES-DOCUSATE SODIUM 8.6-50 MG PO TABS: 1.0000 | ORAL_TABLET | Freq: Every evening | ORAL | Status: DC | PRN

## 2022-07-31 MED ORDER — ONDANSETRON HCL 4 MG/2ML IJ SOLN: 4.0000 mg | Freq: Four times a day (QID) | INTRAMUSCULAR | Status: DC | PRN

## 2022-07-31 MED ORDER — OXYCODONE HCL 5 MG PO TABS
5.0000 mg | ORAL_TABLET | ORAL | Status: DC | PRN
  Administered 2022-08-02 – 2022-08-03 (×4): 5 mg via ORAL
  Filled 2022-07-31 (×4): qty 1

## 2022-07-31 MED ORDER — ACETAMINOPHEN 650 MG RE SUPP: 650.0000 mg | Freq: Four times a day (QID) | RECTAL | Status: DC | PRN

## 2022-07-31 NOTE — ED Triage Notes (Signed)
Pt arrived via POV. Pt has wound on L big toe for 1x month and went to [facility]. 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  [id number]

## 2022-07-31 NOTE — Plan of Care (Signed)

## 2022-07-31 NOTE — H&P (Signed)
History and Physical    MC BLOODWORTH ZOX:[id number] DOB: 1940/02/16 DOA: 07/31/2022  PCP: Gwenyth Bender, MD  Patient coming from: Home  I have personally briefly reviewed patient's old medical records in [facility]  Chief Complaint: Left first toe wound  HPI: Pedro Lee is a 82 y.o. male with medical history significant for chronic diastolic CHF, AVNRT s/p remote ablation, CKD stage IIIa, HTN, HLD, left renal artery stenosis who presented to the ED for evaluation of left first toe wound.  Patient states he first noticed a wound to the tip of his left great toe about 1 month ago.  He did not have any obvious injury to the area.  Wound has progressively worsened over the last month.  He had seen some discharge recently.  He has not had any pain to the area.  Sensation is still intact.  He went to an urgent care where x-ray was performed and concerning for osteomyelitis and he was subsequently sent to the ED for further evaluation.  Patient otherwise denies any fevers, chills, diaphoresis, dyspnea, chest pain.  He reports recent antibiotic use for UTI but not for his toe.  ED Course  Labs/Imaging on admission: I have personally reviewed following labs and imaging studies.  Initial vitals showed BP 172/85, pulse 73, RR 18, temp 98.7 F, SpO2 100% on room air.  Labs show WBC 7.4, hemoglobin 14.0, platelets 199,000, sodium 140, potassium 4.2, bicarb 26, BUN 15, creatinine 1.34, serum glucose 103, LFTs within normal limits, ESR 5.  Blood cultures pending collection.  Left foot x-ray showed soft tissue swelling of the great toe with soft tissue gas.  MRI left foot with and without contrast showed osteomyelitis of the distal phalanx of the first digit.  Deep skin wound about the medial aspect of the distal phalanx of the first digit with surrounding edema noted.  No appreciable abscess.  EDP discussed with on-call podiatry, Dr. Ardelle Anton, who recommended admission and their team will  evaluate in the morning.  The hospitalist service was consulted to admit for further evaluation and management.  Review of Systems: All systems reviewed and are negative except as documented in history of present illness above.   Past Medical History:  Diagnosis Date   Acute on chronic diastolic heart failure (HCC) 12/22/2019   Allergy    sesonal   Complication of anesthesia    HX OF ENLARGED PROSTATE AND UNABLE TO PASS WATER AFTER ANESTHESIA   Dyspnea    with some exertion   Dysrhythmia    HX ATRIAL FIBRILLATION - HAD ABLATION - NO LONGER HAS AF.   Essential hypertension 12/22/2019   Essential hypertension, malignant    Exertional dyspnea 01/27/2021   Gout, unspecified    Lower extremity edema 12/22/2019   Obesity, mild    OSA (obstructive sleep apnea)    Mild - PT STATES HE DID NOT HAVE TO USE CPAP   Osteoarthritis    Osteoarthrosis, unspecified whether generalized or localized, unspecified site    Other dyspnea and respiratory abnormality    Prostate enlargement    DR. NESI IS PT'S UROLOGIST   Pure hypercholesterolemia    Pure hypercholesterolemia 01/27/2021   Renal insufficiency, mild    PT STATES NOT AWARE OF ANY KIDNEY PROBLEM    Past Surgical History:  Procedure Laterality Date   COLONOSCOPY     CRANIOTOMY N/A 03/24/2016   Procedure: Transphenoidal Resection of Tumor;  Surgeon: Julio Sicks, MD;  Location: [facility] OR;  Service: Neurosurgery;  Laterality: N/A;  Transphenoidal Resection of Tumor   HEART ABLATION FOR AF  1998   JOINT REPLACEMENT     LEFT TOTAL KNEE REPLACEMENT   KNEE ARTHROPLASTY Left 2013ish   LEFT KNEE ARTHROSCOPY     TOTAL KNEE ARTHROPLASTY Right 05/30/2013   Procedure: RIGHT TOTAL KNEE ARTHROPLASTY;  Surgeon: Shelda Pal, MD;  Location: [facility];  Service: Orthopedics;  Laterality: Right;   TRANSNASAL APPROACH N/A 03/24/2016   Procedure: TRANSNASAL APPROACH;  Surgeon: Drema Halon, MD;  Location: [facility] OR;  Service: ENT;  Laterality: N/A;   TRANSNASAL APPROACH    Social History:  reports that he has never smoked. He has never used smokeless tobacco. He reports that he does not drink alcohol and does not use drugs.  Allergies  Allergen Reactions   Penicillins Rash    Family History  Problem Relation Age of Onset   Heart disease Father 25     Prior to Admission medications   Medication Sig Start Date End Date Taking? Authorizing Provider  allopurinol (ZYLOPRIM) 300 MG tablet Take 1 tablet by mouth daily.    [provider]  amLODipine (NORVASC) 5 MG tablet TAKE 1 TABLET(5 MG) BY MOUTH AT BEDTIME 10/20/21   Alver Sorrow, NP  ammonium lactate (AMLACTIN) 12 % cream Apply topically. 12/03/20   [provider]  aspirin EC 81 MG tablet Take 81 mg by mouth daily.    [provider]  carvedilol (COREG) 12.5 MG tablet Take 1 tablet (12.5 mg total) by mouth 2 (two) times daily with a meal. 07/07/22   Alver Sorrow, NP  Cholecalciferol 50 MCG (2000 UT) TABS Take by mouth.    [provider]  ciclopirox (PENLAC) 8 % solution Apply topically at bedtime. Apply over nail and surrounding skin. Apply daily over previous coat. After seven (7) days, may remove with alcohol and continue cycle. 07/24/20   Candelaria Stagers, DPM  clotrimazole-betamethasone (LOTRISONE) cream Apply 1 application topically 2 (two) times daily. 07/24/20   Candelaria Stagers, DPM  colchicine 0.6 MG tablet Take by mouth.    [provider]  doxazosin (CARDURA) 2 MG tablet Take 1 tablet (2 mg total) by mouth at bedtime. Please take one 2mg  tablet daily in the evening 06/02/21   Alver Sorrow, NP  fluticasone [facility]) 50 MCG/ACT nasal spray 2 sprays daily as needed for allergies or rhinitis.    [provider]  furosemide (LASIX) 40 MG tablet TAKE 1 TABLET BY MOUTH DAILY AS NEEDED FOR SWELLING 04/19/20   Chilton Si, MD  hydrALAZINE (APRESOLINE) 25 MG tablet Take 1 tablet (25 mg total) by mouth in the morning  and at bedtime. 06/02/21 05/28/22  Alver Sorrow, NP  montelukast (SINGULAIR) 10 MG tablet Take 1 tablet by mouth daily. 01/16/16   [provider]  rosuvastatin (CRESTOR) 20 MG tablet TAKE 1 TABLET(20 MG) BY MOUTH DAILY 07/15/22   Alver Sorrow, NP  sulfamethoxazole-trimethoprim (BACTRIM DS) 800-160 MG tablet Take 1 tablet by mouth 2 (two) times daily. 10/21/21   Alver Sorrow, NP  tamsulosin (FLOMAX) 0.4 MG CAPS capsule Take 0.4 mg by mouth daily. 01/06/16   [provider]    Physical Exam: Vitals:   07/31/22 1959 07/31/22 2100 07/31/22 2220 07/31/22 2320  BP:  (!) 147/88  (!) 155/101  Pulse:  82  85  Resp:  16  18  Temp: 98.1 F (36.7 C)  98.5 F (36.9 C) 97.9 F (36.6 C)  TempSrc: Oral   Oral  SpO2:  97%  98%  Weight:      Height:       Constitutional: Resting in bed, NAD, calm, comfortable Eyes: EOMI, lids and conjunctivae normal ENMT: Mucous membranes are moist. Posterior pharynx clear of any exudate or lesions.Normal dentition.  Neck: normal, supple, no masses. Respiratory: clear to auscultation bilaterally, no wheezing, no crackles. Normal respiratory effort. No accessory muscle use.  Cardiovascular: Regular rate and rhythm, no murmurs / rubs / gallops. No extremity edema. 2+ pedal pulses. Abdomen: no tenderness, no masses palpated.  Musculoskeletal: no clubbing / cyanosis.Good ROM, no contractures. Normal muscle tone.  Skin: Wound distal tip of the left great toe as pictured below.  No purulent discharge or erythema.  No tenderness to palpation. Neurologic: Sensation intact. Strength 5/5 in all 4.  Psychiatric: Normal judgment and insight. Alert and oriented x 3. Normal mood.         EKG: Not performed.  Assessment/Plan Principal Problem:   Osteomyelitis of great toe of left foot (HCC) Active Problems:   Chronic diastolic CHF (congestive heart failure) (HCC)   Chronic kidney disease, stage 3a (HCC)   Essential hypertension    Hyperlipidemia   Pedro Lee is a 82 y.o. male with medical history significant for chronic diastolic CHF, AVNRT s/p remote ablation, CKD stage IIIa, HTN, HLD, left renal artery stenosis who is admitted with osteomyelitis of the distal left great toe. *** Assessment and Plan: No notes have been filed under this hospital service. Service: Hospitalist DVT prophylaxis: SCDs Start: 07/31/22 2251 Code Status: Full code, confirmed with patient on admission Family Communication: Son at bedside Disposition Plan: From home and likely discharge to home pending clinical progress Consults called: Podiatry Severity of Illness: The appropriate patient status for this patient is INPATIENT. Inpatient status is judged to be reasonable and necessary in order to provide the required intensity of service to ensure the patient's safety. The patient's presenting symptoms, physical exam findings, and initial radiographic and laboratory data in the context of their chronic comorbidities is felt to place them at high risk for further clinical deterioration. Furthermore, it is not anticipated that the patient will be medically stable for discharge from the hospital within 2 midnights of admission.   * I certify that at the point of admission it is my clinical judgment that the patient will require inpatient hospital care spanning beyond 2 midnights from the point of admission due to high intensity of service, high risk for further deterioration and high frequency of surveillance required.Darreld Mclean MD [facility]  If 7PM-7AM, please contact night-coverage [email or web address]  07/31/2022, 11:32 PM

## 2022-07-31 NOTE — Hospital Course (Signed)
Pedro Lee is a 82 y.o. male with medical history significant for chronic diastolic CHF, AVNRT s/p remote ablation, CKD stage IIIa, HTN, HLD, left renal artery stenosis who is admitted with osteomyelitis of the distal left great toe.

## 2022-07-31 NOTE — ED Provider Notes (Signed)
[place] EMERGENCY DEPARTMENT AT [facility] Provider Note   CSN: [id number] Arrival date & time: 07/31/22  1535     History  Chief Complaint  Patient presents with   Wound Infection    Pedro Lee is a 82 y.o. male.  82 year old male with a history of hypertension, gout, obesity, and heart failure presents to the emergency department with toe wound.  Patient reports that for the past month has had a wound on his left great toe.  Says that it started becoming more painful and swollen and then 3 days ago started noticing some pus drainage from his foot.  Has an appointment for podiatry next week.  Says that the pain is not severe at this time.  No pain or redness proximally from his toe.  No fevers.  Has not yet been on any antibiotics.  Did go to urgent care today and had an x-ray that showed possible osteomyelitis but I am unable to actually view these images for the results.       Home Medications Prior to Admission medications   Medication Sig Start Date End Date Taking? Authorizing Provider  allopurinol (ZYLOPRIM) 300 MG tablet Take 1 tablet by mouth daily.    [provider]  amLODipine (NORVASC) 5 MG tablet TAKE 1 TABLET(5 MG) BY MOUTH AT BEDTIME 10/20/21   Alver Sorrow, NP  ammonium lactate (AMLACTIN) 12 % cream Apply topically. 12/03/20   [provider]  aspirin EC 81 MG tablet Take 81 mg by mouth daily.    [provider]  carvedilol (COREG) 12.5 MG tablet Take 1 tablet (12.5 mg total) by mouth 2 (two) times daily with a meal. 07/07/22   Alver Sorrow, NP  Cholecalciferol 50 MCG (2000 UT) TABS Take by mouth.    [provider]  ciclopirox (PENLAC) 8 % solution Apply topically at bedtime. Apply over nail and surrounding skin. Apply daily over previous coat. After seven (7) days, may remove with alcohol and continue cycle. 07/24/20   Candelaria Stagers, DPM  clotrimazole-betamethasone (LOTRISONE) cream Apply 1 application  topically 2 (two) times daily. 07/24/20   Candelaria Stagers, DPM  colchicine 0.6 MG tablet Take by mouth.    [provider]  doxazosin (CARDURA) 2 MG tablet Take 1 tablet (2 mg total) by mouth at bedtime. Please take one 2mg  tablet daily in the evening 06/02/21   Alver Sorrow, NP  fluticasone [facility]) 50 MCG/ACT nasal spray 2 sprays daily as needed for allergies or rhinitis.    [provider]  furosemide (LASIX) 40 MG tablet TAKE 1 TABLET BY MOUTH DAILY AS NEEDED FOR SWELLING 04/19/20   Chilton Si, MD  hydrALAZINE (APRESOLINE) 25 MG tablet Take 1 tablet (25 mg total) by mouth in the morning and at bedtime. 06/02/21 05/28/22  Alver Sorrow, NP  montelukast (SINGULAIR) 10 MG tablet Take 1 tablet by mouth daily. 01/16/16   [provider]  rosuvastatin (CRESTOR) 20 MG tablet TAKE 1 TABLET(20 MG) BY MOUTH DAILY 07/15/22   Alver Sorrow, NP  sulfamethoxazole-trimethoprim (BACTRIM DS) 800-160 MG tablet Take 1 tablet by mouth 2 (two) times daily. 10/21/21   Alver Sorrow, NP  tamsulosin (FLOMAX) 0.4 MG CAPS capsule Take 0.4 mg by mouth daily. 01/06/16   [provider]      Allergies    Penicillins    Review of Systems   Review of Systems  Physical Exam Updated Vital Signs BP (!) 173/103  Pulse 91   Temp 98.1 F (36.7 C) (Oral)   Resp 18   Ht 6\' 5"  (1.956 m)   Wt (!) 142 kg   SpO2 100%   BMI 37.12 kg/m  Physical Exam Vitals and nursing note reviewed.  Constitutional:      General: He is not in acute distress.    Appearance: He is well-developed.  HENT:     Head: Normocephalic and atraumatic.     Right Ear: External ear normal.     Left Ear: External ear normal.     Nose: Nose normal.  Eyes:     Extraocular Movements: Extraocular movements intact.     Conjunctiva/sclera: Conjunctivae normal.     Pupils: Pupils are equal, round, and reactive to light.  Pulmonary:     Effort: Pulmonary effort is normal.  Musculoskeletal:      Cervical back: Normal range of motion and neck supple.     Right lower leg: Edema present.     Left lower leg: Edema present.     Comments: Left great toe with large ulcer.  Unable to express any purulent discharge.  Does have some swelling proximally.  No significant erythema noted.  DP pulse 1+ in the RLE.  Skin:    General: Skin is warm and dry.  Neurological:     Mental Status: He is alert. Mental status is at baseline.  Psychiatric:        Mood and Affect: Mood normal.        Behavior: Behavior normal.     ED Results / Procedures / Treatments   Labs (all labs ordered are listed, but only abnormal results are displayed) Labs Reviewed  COMPREHENSIVE METABOLIC PANEL - Abnormal; Notable for the following components:      Result Value   Glucose, Bld 103 (*)    Creatinine, Ser 1.34 (*)    GFR, Estimated 53 (*)    All other components within normal limits  CULTURE, BLOOD (ROUTINE X 2)  CULTURE, BLOOD (ROUTINE X 2)  CBC  SEDIMENTATION RATE  C-REACTIVE PROTEIN    EKG None  Radiology MR FOOT LEFT W WO CONTRAST  Result Date: 07/31/2022 CLINICAL DATA:  Right great toe infection. Wound on the left great toe for a month. Possible osteomyelitis EXAM: MRI OF THE LEFT FOREFOOT WITHOUT AND WITH CONTRAST TECHNIQUE: Multiplanar, multisequence MR imaging of the left forefoot was performed both before and after administration of intravenous contrast. CONTRAST:  10mL GADAVIST GADOBUTROL 1 MMOL/ML IV SOLN COMPARISON:  Radiographs dated July 31, 2022 FINDINGS: Bones/Joint/Cartilage Bone marrow edema of the distal phalanx of the first digit with enhancement on post contrast sequences. Marrow signal within remaining osseous structures is within normal limits. Subchondral cystic changes at the first interphalangeal joint. Ligaments Lisfranc and collateral ligaments are intact. Muscles and Tendons Increased intrasubstance signal of the plantar muscles. No fluid collection or abscess. Soft tissues Deep  skin wound about the medial aspect of the distal phalanx of the first digit with surrounding edema and inflammatory changes and diffuse enhancement. No appreciable abscess. IMPRESSION: 1. Osteomyelitis of the distal phalanx of the first digit. 2. Deep skin wound about the medial aspect of the distal phalanx of the first digit with surrounding edema and enhancement consistent with cellulitis. No appreciable abscess. 3. Increased intrasubstance signal of the plantar muscles consistent with myositis. 4. 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 Electronically Signed   By: Larose Hires D.O.   On: 07/31/2022 21:32   DG Foot  2 Views Left  Result Date: 07/31/2022 CLINICAL DATA:  Leg wound for a month. EXAM: LEFT FOOT - 2 VIEW COMPARISON:  None Available. FINDINGS: Flatfoot deformity on the lateral view. No acute fracture or dislocation. Tiny plantar calcaneal spur. Mild degenerative changes of the dorsal aspect of the midfoot. Soft tissue swelling about the great toe with some lucency along the distal tuft of the distal phalanx. Question some soft tissue gas in the lateral view. Please correlate for area of infection and possible changes of osteomyelitis. IMPRESSION: Soft tissue swelling of the great toe with soft tissue gas. Possible erosive changes involving the distal tuft of the distal phalanx of the great toe on the AP view. Please correlate for developing osteomyelitis. Further evaluation with MRI as clinically appropriate. Flatfoot deformity.  Degenerative changes Electronically Signed   By: Karen Kays M.D.   On: 07/31/2022 18:29    Procedures Procedures    Medications Ordered in ED Medications  gadobutrol (GADAVIST) 1 MMOL/ML injection 10 mL (10 mLs Intravenous Contrast Given 07/31/22 1904)    ED Course/ Medical Decision Making/ A&P Clinical Course as of 07/31/22 2210  Fri Jul 31, 2022  1916 DG Foot 2 Views Left IMPRESSION: Soft tissue swelling of the great toe with soft  tissue gas. Possible erosive changes involving the distal tuft of the distal phalanx of the great toe on the AP view. Please correlate for developing osteomyelitis. Further evaluation with MRI as clinically appropriate. [RP]  2144 Dr Ardelle Anton recommends admission and says that podiatry will evaluate him in the morning. [RP]  2206 Dr Allena Katz from hospitalist will admit the patient.  Recommends holding on antibiotics since the patient is not septic currently and this will allow them to get a good bone culture. [RP]    Clinical Course User Index [RP] Rondel Baton, MD                             Medical Decision Making Amount and/or Complexity of Data Reviewed Labs: ordered. Radiology: ordered. Decision-making details documented in ED Course.  Risk Prescription drug management. Decision regarding hospitalization.   EDGER HUSAIN is a 82 y.o. male with comorbidities that complicate the patient evaluation including hypertension, gout, obesity, and heart failure who presents emergency department with toe wound and x-ray concerning for osteomyelitis and pus from his foot  Initial Ddx:  Osteomyelitis, abscess, cellulitis, ischemic toe  MDM/Course:  Feel the patient likely has cellulitis and osteomyelitis from chronic foot wound.  No readily apparent fluctuance but with the pus that has been expressed from his foot I did obtain an MRI which did show evidence of osteomyelitis and cellulitis but no abscess.  Does have a palpable pulse of feel his toes not likely having significant ischemia that is causing the wound or his symptoms.  Discussed with podiatry who wants him admitted to medicine and they will evaluate him in the morning.  Will hold off on antibiotics to facilitate wound culture since he is not septic at this time.  Upon re-evaluation patient remained stable and not in significant pain.  This patient presents to the ED for concern of complaints listed in HPI, this involves an  extensive number of treatment options, and is a complaint that carries with it a high risk of complications and morbidity. Disposition including potential need for admission considered.   Dispo: Admit to Floor  Additional history obtained from family Records reviewed Outpatient Clinic Notes The following  labs were independently interpreted: Chemistry and show CKD I independently reviewed the following imaging with scope of interpretation limited to determining acute life threatening conditions related to emergency care: Extremity x-ray(s) and agree with the radiologist interpretation with the following exceptions: none I personally reviewed and interpreted cardiac monitoring: normal sinus rhythm  I personally reviewed and interpreted the pt's EKG: see above for interpretation  I have reviewed the patients home medications and made adjustments as needed Consults: Hospitalist and podiatry Social Determinants of health:  Elderly         Final Clinical Impression(s) / ED Diagnoses Final diagnoses:  Other acute osteomyelitis of left foot (HCC)  Cellulitis of left lower extremity    Rx / DC Orders ED Discharge Orders     None         Rondel Baton, MD 07/31/22 2210

## 2022-08-01 ENCOUNTER — Encounter (HOSPITAL_COMMUNITY): Payer: Self-pay | Admitting: Internal Medicine

## 2022-08-01 DIAGNOSIS — M869 Osteomyelitis, unspecified: Secondary | ICD-10-CM

## 2022-08-01 DIAGNOSIS — N1831 Chronic kidney disease, stage 3a: Secondary | ICD-10-CM

## 2022-08-01 DIAGNOSIS — I5032 Chronic diastolic (congestive) heart failure: Secondary | ICD-10-CM | POA: Diagnosis not present

## 2022-08-01 LAB — CBC
HCT: 39.9 % (ref 39.0–52.0)
Hemoglobin: 12.8 g/dL — ABNORMAL LOW (ref 13.0–17.0)
MCH: 28.1 pg (ref 26.0–34.0)
MCHC: 32.1 g/dL (ref 30.0–36.0)
MCV: 87.7 fL (ref 80.0–100.0)
Platelets: 192 10*3/uL (ref 150–400)
RBC: 4.55 MIL/uL (ref 4.22–5.81)
RDW: 14.5 % (ref 11.5–15.5)
WBC: 8.4 10*3/uL (ref 4.0–10.5)
nRBC: 0 % (ref 0.0–0.2)

## 2022-08-01 LAB — BASIC METABOLIC PANEL
Anion gap: 8 (ref 5–15)
BUN: 14 mg/dL (ref 8–23)
CO2: 22 mmol/L (ref 22–32)
Calcium: 9.1 mg/dL (ref 8.9–10.3)
Chloride: 109 mmol/L (ref 98–111)
Creatinine, Ser: 1.26 mg/dL — ABNORMAL HIGH (ref 0.61–1.24)
GFR, Estimated: 57 mL/min — ABNORMAL LOW (ref >60)
Glucose, Bld: 94 mg/dL (ref 70–99)
Potassium: 3.9 mmol/L (ref 3.5–5.1)
Sodium: 139 mmol/L (ref 135–145)

## 2022-08-01 MED ORDER — FUROSEMIDE 40 MG PO TABS: 40.0000 mg | ORAL_TABLET | Freq: Every day | ORAL | Status: DC | PRN

## 2022-08-01 MED ORDER — TAMSULOSIN HCL 0.4 MG PO CAPS
0.4000 mg | ORAL_CAPSULE | Freq: Every day | ORAL | Status: DC
  Administered 2022-08-02 – 2022-08-03 (×2): 0.4 mg via ORAL
  Filled 2022-08-01 (×2): qty 1

## 2022-08-01 MED ORDER — DOXAZOSIN MESYLATE 4 MG PO TABS
2.0000 mg | ORAL_TABLET | Freq: Every day | ORAL | Status: DC
  Administered 2022-08-01 – 2022-08-02 (×3): 2 mg via ORAL
  Filled 2022-08-01 (×3): qty 1

## 2022-08-01 MED ORDER — CARVEDILOL 12.5 MG PO TABS
12.5000 mg | ORAL_TABLET | Freq: Two times a day (BID) | ORAL | Status: DC
  Administered 2022-08-01 – 2022-08-03 (×7): 12.5 mg via ORAL
  Filled 2022-08-01 (×7): qty 1

## 2022-08-01 MED ORDER — ROSUVASTATIN CALCIUM 20 MG PO TABS
20.0000 mg | ORAL_TABLET | Freq: Every day | ORAL | Status: DC
  Administered 2022-08-01 – 2022-08-03 (×3): 20 mg via ORAL
  Filled 2022-08-01 (×3): qty 1

## 2022-08-01 MED ORDER — MONTELUKAST SODIUM 10 MG PO TABS
10.0000 mg | ORAL_TABLET | Freq: Every day | ORAL | Status: DC
  Administered 2022-08-01 – 2022-08-02 (×2): 10 mg via ORAL
  Filled 2022-08-01 (×2): qty 1

## 2022-08-01 MED ORDER — AMLODIPINE BESYLATE 5 MG PO TABS
5.0000 mg | ORAL_TABLET | Freq: Every day | ORAL | Status: DC
  Administered 2022-08-01 – 2022-08-02 (×3): 5 mg via ORAL
  Filled 2022-08-01 (×3): qty 1

## 2022-08-01 MED ORDER — ALLOPURINOL 300 MG PO TABS
300.0000 mg | ORAL_TABLET | Freq: Every day | ORAL | Status: DC
  Administered 2022-08-02 – 2022-08-03 (×2): 300 mg via ORAL
  Filled 2022-08-01 (×2): qty 1

## 2022-08-01 NOTE — Assessment & Plan Note (Signed)
Renal function stable.

## 2022-08-01 NOTE — Assessment & Plan Note (Signed)
MRI shows osteomyelitis of the distal left great toe.  Patient is hemodynamically stable, afebrile, without leukocytosis. -EDP discussed with podiatry, they will consult in a.m. -Will hold systemic antibiotics at this time pending podiatry evaluation for possible surgical intervention

## 2022-08-01 NOTE — H&P (View-Only) (Signed)
Reason for Consult:Osteomyelitis left big toe Referring Physician: Dr. Vonita Moss, MD  Pedro Lee is an 82 y.o. male.  HPI: 81 y.o. male with medical history significant for chronic diastolic CHF, AVNRT s/p remote ablation, CKD stage IIIa, HTN, HLD, left renal artery stenosis who presented to the ED for evaluation of left first toe wound.  Patient states he been feeling this for about a month.  Currently denies any fever or chills.  Past Medical History:  Diagnosis Date   Acute on chronic diastolic heart failure (HCC) 12/22/2019   Allergy    sesonal   Complication of anesthesia    HX OF ENLARGED PROSTATE AND UNABLE TO PASS WATER AFTER ANESTHESIA   Dyspnea    with some exertion   Dysrhythmia    HX ATRIAL FIBRILLATION - HAD ABLATION - NO LONGER HAS AF.   Essential hypertension 12/22/2019   Essential hypertension, malignant    Exertional dyspnea 01/27/2021   Gout, unspecified    Lower extremity edema 12/22/2019   Obesity, mild    OSA (obstructive sleep apnea)    Mild - PT STATES HE DID NOT HAVE TO USE CPAP   Osteoarthritis    Osteoarthrosis, unspecified whether generalized or localized, unspecified site    Other dyspnea and respiratory abnormality    Prostate enlargement    DR. NESI IS PT'S UROLOGIST   Pure hypercholesterolemia    Pure hypercholesterolemia 01/27/2021   Renal insufficiency, mild    PT STATES NOT AWARE OF ANY KIDNEY PROBLEM    Past Surgical History:  Procedure Laterality Date   COLONOSCOPY     CRANIOTOMY N/A 03/24/2016   Procedure: Transphenoidal Resection of Tumor;  Surgeon: Julio Sicks, MD;  Location: [facility] OR;  Service: Neurosurgery;  Laterality: N/A;  Transphenoidal Resection of Tumor   HEART ABLATION FOR AF  1998   JOINT REPLACEMENT     LEFT TOTAL KNEE REPLACEMENT   KNEE ARTHROPLASTY Left 2013ish   LEFT KNEE ARTHROSCOPY     TOTAL KNEE ARTHROPLASTY Right 05/30/2013   Procedure: RIGHT TOTAL KNEE ARTHROPLASTY;  Surgeon: Shelda Pal, MD;  Location: [facility]  ORS;  Service: Orthopedics;  Laterality: Right;   TRANSNASAL APPROACH N/A 03/24/2016   Procedure: TRANSNASAL APPROACH;  Surgeon: Drema Halon, MD;  Location: [facility] OR;  Service: ENT;  Laterality: N/A;  TRANSNASAL APPROACH    Family History  Problem Relation Age of Onset   Heart disease Father 33    Social History:  reports that he has never smoked. He has never used smokeless tobacco. He reports that he does not drink alcohol and does not use drugs.  Allergies:  Allergies  Allergen Reactions   Penicillins Rash    Medications: I have reviewed the patient's current medications.  Results for orders placed or performed during the hospital encounter of 07/31/22 (from the past 48 hour(s))  CBC     Status: None   Collection Time: 07/31/22  5:11 PM  Result Value Ref Range   WBC 7.4 4.0 - 10.5 K/uL   RBC 5.02 4.22 - 5.81 MIL/uL   Hemoglobin 14.0 13.0 - 17.0 g/dL   HCT 16.1 09.6 - 04.5 %   MCV 88.8 80.0 - 100.0 fL   MCH 27.9 26.0 - 34.0 pg   MCHC 31.4 30.0 - 36.0 g/dL   RDW 40.9 81.1 - 91.4 %   Platelets 199 150 - 400 K/uL   nRBC 0.0 0.0 - 0.2 %    Comment: Performed at [facility], [address] [place], [place] [address]  Comprehensive metabolic panel     Status: Abnormal   Collection Time: 07/31/22  5:11 PM  Result Value Ref Range   Sodium 140 135 - 145 mmol/L   Potassium 4.2 3.5 - 5.1 mmol/L   Chloride 106 98 - 111 mmol/L   CO2 26 22 - 32 mmol/L   Glucose, Bld 103 (H) 70 - 99 mg/dL    Comment: Glucose reference range applies only to samples taken after fasting for at least 8 hours.   BUN 15 8 - 23 mg/dL   Creatinine, Ser 4.09 (H) 0.61 - 1.24 mg/dL   Calcium 9.2 8.9 - 81.1 mg/dL   Total Protein 7.0 6.5 - 8.1 g/dL   Albumin 3.8 3.5 - 5.0 g/dL   AST 19 15 - 41 U/L   ALT 16 0 - 44 U/L   Alkaline Phosphatase 50 38 - 126 U/L   Total Bilirubin 0.6 0.3 - 1.2 mg/dL   GFR, Estimated 53 (L) >60 mL/min    Comment: (NOTE) Calculated using the CKD-EPI  Creatinine Equation (2021)    Anion gap 8 5 - 15    Comment: Performed at [facility], [address], [place], [place] [address]  Sedimentation rate     Status: None   Collection Time: 07/31/22  6:30 PM  Result Value Ref Range   Sed Rate 5 0 - 16 mm/hr    Comment: Performed at [facility], [address], [place], [place] [address]  C-reactive protein     Status: None   Collection Time: 07/31/22  6:30 PM  Result Value Ref Range   CRP 0.6 <1.0 mg/dL    Comment: Performed at [facility], [address] [place], [place] [address]  Blood culture (routine x 2)     Status: None (Preliminary result)   Collection Time: 07/31/22 10:27 PM   Specimen: BLOOD  Result Value Ref Range   Specimen Description BLOOD RIGHT ANTECUBITAL    Special Requests      BOTTLES DRAWN AEROBIC AND ANAEROBIC Blood Culture adequate volume   Culture      NO GROWTH < 12 HOURS Performed at [facility], [address], [place], [place] [address]    Report Status PENDING   Blood culture (routine x 2)     Status: None (Preliminary result)   Collection Time: 07/31/22 10:27 PM   Specimen: BLOOD RIGHT ARM  Result Value Ref Range   Specimen Description BLOOD RIGHT ARM    Special Requests      BOTTLES DRAWN AEROBIC AND ANAEROBIC Blood Culture adequate volume   Culture      NO GROWTH < 12 HOURS Performed at [facility], [address] [place], [place] [address]    Report Status PENDING   CBC     Status: Abnormal   Collection Time: 08/01/22  3:54 AM  Result Value Ref Range   WBC 8.4 4.0 - 10.5 K/uL   RBC 4.55 4.22 - 5.81 MIL/uL   Hemoglobin 12.8 (L) 13.0 - 17.0 g/dL   HCT 28.4 13.2 - 44.0 %   MCV 87.7 80.0 - 100.0 fL   MCH 28.1 26.0 - 34.0 pg   MCHC 32.1 30.0 - 36.0 g/dL   RDW 10.2 72.5 - 36.6 %   Platelets 192 150 - 400 K/uL   nRBC 0.0 0.0 - 0.2 %    Comment: Performed at [facility], [address] [place], [place] [address]   Basic metabolic panel     Status: Abnormal   Collection Time: 08/01/22  5:19 AM  Result Value Ref Range   Sodium 139 135 - 145 mmol/L   Potassium 3.9 3.5 - 5.1 mmol/L   Chloride 109 98 - 111 mmol/L   CO2 22 22 - 32 mmol/L   Glucose, Bld 94 70 - 99 mg/dL    Comment: Glucose reference range applies only to samples taken after fasting for at least 8 hours.   BUN 14 8 - 23 mg/dL   Creatinine, Ser 6.04 (H) 0.61 - 1.24 mg/dL   Calcium 9.1 8.9 - 54.0 mg/dL   GFR, Estimated 57 (L) >60 mL/min    Comment: (NOTE) Calculated using the CKD-EPI Creatinine Equation (2021)    Anion gap 8 5 - 15    Comment: Performed at [facility], [address], [place], [place] [address]    MR FOOT LEFT W WO CONTRAST  Result Date: 07/31/2022 CLINICAL DATA:  Right great toe infection. Wound on the left great toe for a month. Possible osteomyelitis EXAM: MRI OF THE LEFT FOREFOOT WITHOUT AND WITH CONTRAST TECHNIQUE: Multiplanar, multisequence MR imaging of the left forefoot was performed both before and after administration of intravenous contrast. CONTRAST:  10mL GADAVIST GADOBUTROL 1 MMOL/ML IV SOLN COMPARISON:  Radiographs dated July 31, 2022 FINDINGS: Bones/Joint/Cartilage Bone marrow edema of the distal phalanx of the first digit with enhancement on post contrast sequences. Marrow signal within remaining osseous structures is within normal limits. Subchondral cystic changes at the first interphalangeal joint. Ligaments Lisfranc and collateral ligaments are intact. Muscles and Tendons Increased intrasubstance signal of the plantar muscles. No fluid collection or abscess. Soft tissues Deep skin wound about the medial aspect of the distal phalanx of the first digit with surrounding edema and inflammatory changes and diffuse enhancement. No appreciable abscess. IMPRESSION: 1. Osteomyelitis of the distal phalanx of the first digit. 2. Deep skin wound about the medial aspect of the distal phalanx of the  first digit with surrounding edema and enhancement consistent with cellulitis. No appreciable abscess. 3. Increased intrasubstance signal of the plantar muscles consistent with myositis. 4. 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 Electronically Signed   By: Larose Hires D.O.   On: 07/31/2022 21:32   DG Foot 2 Views Left  Result Date: 07/31/2022 CLINICAL DATA:  Leg wound for a month. EXAM: LEFT FOOT - 2 VIEW COMPARISON:  None Available. FINDINGS: Flatfoot deformity on the lateral view. No acute fracture or dislocation. Tiny plantar calcaneal spur. Mild degenerative changes of the dorsal aspect of the midfoot. Soft tissue swelling about the great toe with some lucency along the distal tuft of the distal phalanx. Question some soft tissue gas in the lateral view. Please correlate for area of infection and possible changes of osteomyelitis. IMPRESSION: Soft tissue swelling of the great toe with soft tissue gas. Possible erosive changes involving the distal tuft of the distal phalanx of the great toe on the AP view. Please correlate for developing osteomyelitis. Further evaluation with MRI as clinically appropriate. Flatfoot deformity.  Degenerative changes Electronically Signed   By: Karen Kays M.D.   On: 07/31/2022 18:29    Review of Systems Blood pressure 133/82, pulse 72, temperature 98.2 F (36.8 C), temperature source Oral, resp. rate 18, height 6\' 5"  (1.956 m), weight (!) 142 kg, SpO2 98 %. Physical Exam General: AAO x3, NAD-sitting up in chair  Dermatological: Ulceration distal  aspect left hallux with blister around the area there is edema present.  There is no frank purulence noted today and there is no ascending cellulitis.  No crepitation.  Vascular: Dorsalis Pedis artery and Posterior Tibial artery pedal pulses are 1/4 bilateral with immedate capillary fill time however there is swelling which limits evaluation. There is no pain with calf compression, swelling, warmth,  erythema.   Neruologic: Sensation decreased  Musculoskeletal: Flatfoot present as well as hammertoes.  No significant pain to the ulcer.   Assessment/Plan:  Osteomyelitis left hallux  -I reviewed the x-rays as well as the MRI findings with the patient as well as family who is at bedside.  MRI is concerning for osteomyelitis.  Discussed different treatments for this including long-term antibiotics as well as amputation.  Given the infection I recommended amputation.  We could try to do partial toe amputation if able.  We discussed the surgery as well as postoperative course.  We discussed risks of surgery including spread of infection, need for further surgery, further amputation as well as bleeding, scarring as well as general risks of surgery.  After discussion the patient is a little hesitant to proceed with surgery as he already has mobility problems with his ankles.  Discussed that my recommendation is amputation of the toe.  His son is coming to the hospital later today he will discuss this with him.  I will tentatively schedule him for surgery tomorrow afternoon.  N.p.o. after midnight. -ABI -OK to start broad spectrum antibiotics -Weight-bear as tolerated -Podiatry will continue to follow.  *I called the patient on 08/01/2022 in the evening. He has discussed with this son and he wishes to proceed with surgery/amputation. Scheduled for Sunday  Vivi Barrack 08/01/2022, 1:09 PM

## 2022-08-01 NOTE — Consult Note (Signed)
Reason for Consult:Osteomyelitis left big toe Referring Physician: Dr. Robert Paterson, MD  Pedro Lee is an 82 y.o. male.  HPI: 82 y.o. male with medical history significant for chronic diastolic CHF, AVNRT s/p remote ablation, CKD stage IIIa, HTN, HLD, left renal artery stenosis who presented to the ED for evaluation of left first toe wound.  Patient states he been feeling this for about a month.  Currently denies any fever or chills.  Past Medical History:  Diagnosis Date   Acute on chronic diastolic heart failure (HCC) 12/22/2019   Allergy    sesonal   Complication of anesthesia    HX OF ENLARGED PROSTATE AND UNABLE TO PASS WATER AFTER ANESTHESIA   Dyspnea    with some exertion   Dysrhythmia    HX ATRIAL FIBRILLATION - HAD ABLATION - NO LONGER HAS AF.   Essential hypertension 12/22/2019   Essential hypertension, malignant    Exertional dyspnea 01/27/2021   Gout, unspecified    Lower extremity edema 12/22/2019   Obesity, mild    OSA (obstructive sleep apnea)    Mild - PT STATES HE DID NOT HAVE TO USE CPAP   Osteoarthritis    Osteoarthrosis, unspecified whether generalized or localized, unspecified site    Other dyspnea and respiratory abnormality    Prostate enlargement    DR. NESI IS PT'S UROLOGIST   Pure hypercholesterolemia    Pure hypercholesterolemia 01/27/2021   Renal insufficiency, mild    PT STATES NOT AWARE OF ANY KIDNEY PROBLEM    Past Surgical History:  Procedure Laterality Date   COLONOSCOPY     CRANIOTOMY N/A 03/24/2016   Procedure: Transphenoidal Resection of Tumor;  Surgeon: Henry Pool, MD;  Location: [facility] OR;  Service: Neurosurgery;  Laterality: N/A;  Transphenoidal Resection of Tumor   HEART ABLATION FOR AF  1998   JOINT REPLACEMENT     LEFT TOTAL KNEE REPLACEMENT   KNEE ARTHROPLASTY Left 2013ish   LEFT KNEE ARTHROSCOPY     TOTAL KNEE ARTHROPLASTY Right 05/30/2013   Procedure: RIGHT TOTAL KNEE ARTHROPLASTY;  Surgeon: Meleni Delahunt D Olin, MD;  Location: [facility]  ORS;  Service: Orthopedics;  Laterality: Right;   TRANSNASAL APPROACH N/A 03/24/2016   Procedure: TRANSNASAL APPROACH;  Surgeon: Christopher E Newman, MD;  Location: [facility] OR;  Service: ENT;  Laterality: N/A;  TRANSNASAL APPROACH    Family History  Problem Relation Age of Onset   Heart disease Father 67    Social History:  reports that he has never smoked. He has never used smokeless tobacco. He reports that he does not drink alcohol and does not use drugs.  Allergies:  Allergies  Allergen Reactions   Penicillins Rash    Medications: I have reviewed the patient's current medications.  Results for orders placed or performed during the hospital encounter of 07/31/22 (from the past 48 hour(s))  CBC     Status: None   Collection Time: 07/31/22  5:11 PM  Result Value Ref Range   WBC 7.4 4.0 - 10.5 K/uL   RBC 5.02 4.22 - 5.81 MIL/uL   Hemoglobin 14.0 13.0 - 17.0 g/dL   HCT 44.6 39.0 - 52.0 %   MCV 88.8 80.0 - 100.0 fL   MCH 27.9 26.0 - 34.0 pg   MCHC 31.4 30.0 - 36.0 g/dL   RDW 14.5 11.5 - 15.5 %   Platelets 199 150 - 400 K/uL   nRBC 0.0 0.0 - 0.2 %    Comment: Performed at  [facility], [address] [place], [place] [address]  Comprehensive metabolic panel     Status: Abnormal   Collection Time: 07/31/22  5:11 PM  Result Value Ref Range   Sodium 140 135 - 145 mmol/L   Potassium 4.2 3.5 - 5.1 mmol/L   Chloride 106 98 - 111 mmol/L   CO2 26 22 - 32 mmol/L   Glucose, Bld 103 (H) 70 - 99 mg/dL    Comment: Glucose reference range applies only to samples taken after fasting for at least 8 hours.   BUN 15 8 - 23 mg/dL   Creatinine, Ser 1.34 (H) 0.61 - 1.24 mg/dL   Calcium 9.2 8.9 - 10.3 mg/dL   Total Protein 7.0 6.5 - 8.1 g/dL   Albumin 3.8 3.5 - 5.0 g/dL   AST 19 15 - 41 U/L   ALT 16 0 - 44 U/L   Alkaline Phosphatase 50 38 - 126 U/L   Total Bilirubin 0.6 0.3 - 1.2 mg/dL   GFR, Estimated 53 (L) >60 mL/min    Comment: (NOTE) Calculated using the CKD-EPI  Creatinine Equation (2021)    Anion gap 8 5 - 15    Comment: Performed at [facility], [address] [place], [place] [address]  Sedimentation rate     Status: None   Collection Time: 07/31/22  6:30 PM  Result Value Ref Range   Sed Rate 5 0 - 16 mm/hr    Comment: Performed at [facility], [address] [place], [place] [address]  C-reactive protein     Status: None   Collection Time: 07/31/22  6:30 PM  Result Value Ref Range   CRP 0.6 <1.0 mg/dL    Comment: Performed at [facility], [address] [place], [place] [address]  Blood culture (routine x 2)     Status: None (Preliminary result)   Collection Time: 07/31/22 10:27 PM   Specimen: BLOOD  Result Value Ref Range   Specimen Description BLOOD RIGHT ANTECUBITAL    Special Requests      BOTTLES DRAWN AEROBIC AND ANAEROBIC Blood Culture adequate volume   Culture      NO GROWTH < 12 HOURS Performed at [facility], [address] [place], [place] [address]    Report Status PENDING   Blood culture (routine x 2)     Status: None (Preliminary result)   Collection Time: 07/31/22 10:27 PM   Specimen: BLOOD RIGHT ARM  Result Value Ref Range   Specimen Description BLOOD RIGHT ARM    Special Requests      BOTTLES DRAWN AEROBIC AND ANAEROBIC Blood Culture adequate volume   Culture      NO GROWTH < 12 HOURS Performed at [facility], [address] [place], [place] [address]    Report Status PENDING   CBC     Status: Abnormal   Collection Time: 08/01/22  3:54 AM  Result Value Ref Range   WBC 8.4 4.0 - 10.5 K/uL   RBC 4.55 4.22 - 5.81 MIL/uL   Hemoglobin 12.8 (L) 13.0 - 17.0 g/dL   HCT 39.9 39.0 - 52.0 %   MCV 87.7 80.0 - 100.0 fL   MCH 28.1 26.0 - 34.0 pg   MCHC 32.1 30.0 - 36.0 g/dL   RDW 14.5 11.5 - 15.5 %   Platelets 192 150 - 400 K/uL   nRBC 0.0 0.0 - 0.2 %    Comment: Performed at [facility], [address] [place], [place] [address]   Basic metabolic panel     Status: Abnormal   Collection Time: 08/01/22  5:19 AM  Result Value Ref Range   Sodium 139 135 - 145 mmol/L   Potassium 3.9 3.5 - 5.1 mmol/L   Chloride 109 98 - 111 mmol/L   CO2 22 22 - 32 mmol/L   Glucose, Bld 94 70 - 99 mg/dL    Comment: Glucose reference range applies only to samples taken after fasting for at least 8 hours.   BUN 14 8 - 23 mg/dL   Creatinine, Ser 1.26 (H) 0.61 - 1.24 mg/dL   Calcium 9.1 8.9 - 10.3 mg/dL   GFR, Estimated 57 (L) >60 mL/min    Comment: (NOTE) Calculated using the CKD-EPI Creatinine Equation (2021)    Anion gap 8 5 - 15    Comment: Performed at [facility], [address] [place], [place] [address]    MR FOOT LEFT W WO CONTRAST  Result Date: 07/31/2022 CLINICAL DATA:  Right great toe infection. Wound on the left great toe for a month. Possible osteomyelitis EXAM: MRI OF THE LEFT FOREFOOT WITHOUT AND WITH CONTRAST TECHNIQUE: Multiplanar, multisequence MR imaging of the left forefoot was performed both before and after administration of intravenous contrast. CONTRAST:  10mL GADAVIST GADOBUTROL 1 MMOL/ML IV SOLN COMPARISON:  Radiographs dated July 31, 2022 FINDINGS: Bones/Joint/Cartilage Bone marrow edema of the distal phalanx of the first digit with enhancement on post contrast sequences. Marrow signal within remaining osseous structures is within normal limits. Subchondral cystic changes at the first interphalangeal joint. Ligaments Lisfranc and collateral ligaments are intact. Muscles and Tendons Increased intrasubstance signal of the plantar muscles. No fluid collection or abscess. Soft tissues Deep skin wound about the medial aspect of the distal phalanx of the first digit with surrounding edema and inflammatory changes and diffuse enhancement. No appreciable abscess. IMPRESSION: 1. Osteomyelitis of the distal phalanx of the first digit. 2. Deep skin wound about the medial aspect of the distal phalanx of the  first digit with surrounding edema and enhancement consistent with cellulitis. No appreciable abscess. 3. Increased intrasubstance signal of the plantar muscles consistent with myositis. 4. 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 Electronically Signed   By: Imran  Ahmed D.O.   On: 07/31/2022 21:32   DG Foot 2 Views Left  Result Date: 07/31/2022 CLINICAL DATA:  Leg wound for a month. EXAM: LEFT FOOT - 2 VIEW COMPARISON:  None Available. FINDINGS: Flatfoot deformity on the lateral view. No acute fracture or dislocation. Tiny plantar calcaneal spur. Mild degenerative changes of the dorsal aspect of the midfoot. Soft tissue swelling about the great toe with some lucency along the distal tuft of the distal phalanx. Question some soft tissue gas in the lateral view. Please correlate for area of infection and possible changes of osteomyelitis. IMPRESSION: Soft tissue swelling of the great toe with soft tissue gas. Possible erosive changes involving the distal tuft of the distal phalanx of the great toe on the AP view. Please correlate for developing osteomyelitis. Further evaluation with MRI as clinically appropriate. Flatfoot deformity.  Degenerative changes Electronically Signed   By: Ashok  Gupta M.D.   On: 07/31/2022 18:29    Review of Systems Blood pressure 133/82, pulse 72, temperature 98.2 F (36.8 C), temperature source Oral, resp. rate 18, height 6' 5" (1.956 m), weight (!) 142 kg, SpO2 98 %. Physical Exam General: AAO x3, NAD-sitting up in chair  Dermatological: Ulceration distal   aspect left hallux with blister around the area there is edema present.  There is no frank purulence noted today and there is no ascending cellulitis.  No crepitation.  Vascular: Dorsalis Pedis artery and Posterior Tibial artery pedal pulses are 1/4 bilateral with immedate capillary fill time however there is swelling which limits evaluation. There is no pain with calf compression, swelling, warmth,  erythema.   Neruologic: Sensation decreased  Musculoskeletal: Flatfoot present as well as hammertoes.  No significant pain to the ulcer.   Assessment/Plan:  Osteomyelitis left hallux  -I reviewed the x-rays as well as the MRI findings with the patient as well as family who is at bedside.  MRI is concerning for osteomyelitis.  Discussed different treatments for this including long-term antibiotics as well as amputation.  Given the infection I recommended amputation.  We could try to do partial toe amputation if able.  We discussed the surgery as well as postoperative course.  We discussed risks of surgery including spread of infection, need for further surgery, further amputation as well as bleeding, scarring as well as general risks of surgery.  After discussion the patient is a little hesitant to proceed with surgery as he already has mobility problems with his ankles.  Discussed that my recommendation is amputation of the toe.  His son is coming to the hospital later today he will discuss this with him.  I will tentatively schedule him for surgery tomorrow afternoon.  N.p.o. after midnight. -ABI -OK to start broad spectrum antibiotics -Weight-bear as tolerated -Podiatry will continue to follow.  *I called the patient on 08/01/2022 in the evening. He has discussed with this son and he wishes to proceed with surgery/amputation. Scheduled for Sunday  Armenta Erskin R Lacara Dunsworth 08/01/2022, 1:09 PM      

## 2022-08-01 NOTE — Assessment & Plan Note (Signed)
Continue rosuvastatin.  

## 2022-08-01 NOTE — Assessment & Plan Note (Signed)
Continue amlodipine, Coreg, Cardura.

## 2022-08-01 NOTE — Assessment & Plan Note (Signed)
Appears euvolemic. TTE 08/2021 showed EF 65-70%.  Continue Coreg, Lasix 40 mg daily prn.

## 2022-08-01 NOTE — Progress Notes (Signed)
  Progress Note   Patient: Pedro Lee JWJ:[id number] DOB: February 03, 1941 DOA: 07/31/2022     1 DOS: the patient was seen and examined on 08/01/2022   Brief hospital course: 82 year old man PMH including CKD, presented with left first toe wound.  Further evaluation revealed osteomyelitis left great toe.  Admitted for podiatry consultation.   Assessment and Plan: * Osteomyelitis of great toe of left foot (HCC) MRI showed osteomyelitis of the distal left great toe.  Patient is hemodynamically stable, afebrile, without leukocytosis. Likely plan for surgery tomorrow. Will hold systemic antibiotics unless condition changes in order to obtain better culture.  Chronic diastolic CHF (congestive heart failure) (HCC) Appears compensated.  TTE 08/2021 showed EF 65-70%.  Continue Coreg, Lasix 40 mg daily prn.  Chronic kidney disease, stage 3a (HCC) Stable.  Essential hypertension Continue amlodipine, Coreg, Cardura.  Hyperlipidemia Continue rosuvastatin.     Subjective:  Feels ok  Physical Exam: Vitals:   07/31/22 2320 08/01/22 0619 08/01/22 1005 08/01/22 1324  BP: (!) 155/101 126/79 133/82 120/82  Pulse: 85 64 72 75  Resp: 18 18 18 18   Temp: 97.9 F (36.6 C) 97.9 F (36.6 C) 98.2 F (36.8 C) 98.2 F (36.8 C)  TempSrc: Oral Oral Oral Oral  SpO2: 98% 98% 98% 98%  Weight:      Height:       Physical Exam Vitals reviewed.  Constitutional:      General: He is not in acute distress.    Appearance: He is not ill-appearing or toxic-appearing.  Cardiovascular:     Rate and Rhythm: Normal rate and regular rhythm.     Heart sounds: No murmur heard. Pulmonary:     Effort: Pulmonary effort is normal. No respiratory distress.     Breath sounds: No wheezing, rhonchi or rales.  Neurological:     Mental Status: He is alert.  Psychiatric:        Behavior: Behavior normal.     Data Reviewed: Baseline creatinine ~1.3 BMP noted CBC noted CRP WNL  Family Communication: friend "like a  son" at bedside  Disposition: Status is: Inpatient Remains inpatient appropriate because: osteomyelitis  Planned Discharge Destination: Home    Time spent: 20 minutes  Author: Brendia Sacks, MD 08/01/2022 4:08 PM  For on call review [email or web address].

## 2022-08-02 ENCOUNTER — Inpatient Hospital Stay (HOSPITAL_COMMUNITY): Payer: Medicare PPO | Admitting: Certified Registered"

## 2022-08-02 ENCOUNTER — Inpatient Hospital Stay (HOSPITAL_COMMUNITY): Payer: Medicare PPO

## 2022-08-02 ENCOUNTER — Other Ambulatory Visit: Payer: Self-pay

## 2022-08-02 ENCOUNTER — Encounter (HOSPITAL_COMMUNITY)
Admission: EM | Disposition: A | Discharge status: Home Health | Payer: Self-pay | Source: Home / Self Care | Attending: Family Medicine

## 2022-08-02 DIAGNOSIS — Z6837 Body mass index (BMI) 37.0-37.9, adult: Secondary | ICD-10-CM

## 2022-08-02 DIAGNOSIS — M869 Osteomyelitis, unspecified: Secondary | ICD-10-CM

## 2022-08-02 DIAGNOSIS — N1831 Chronic kidney disease, stage 3a: Secondary | ICD-10-CM | POA: Diagnosis not present

## 2022-08-02 DIAGNOSIS — I10 Essential (primary) hypertension: Secondary | ICD-10-CM

## 2022-08-02 DIAGNOSIS — I5032 Chronic diastolic (congestive) heart failure: Secondary | ICD-10-CM | POA: Diagnosis not present

## 2022-08-02 HISTORY — PX: AMPUTATION TOE: SHX6595

## 2022-08-02 SURGERY — AMPUTATION, TOE
Anesthesia: Monitor Anesthesia Care | Site: Toe | Laterality: Left

## 2022-08-02 MED ORDER — LIDOCAINE-EPINEPHRINE 2 %-1:100000 IJ SOLN
INTRAMUSCULAR | Status: DC | PRN
  Administered 2022-08-02: 15 mL

## 2022-08-02 MED ORDER — VANCOMYCIN HCL 1750 MG/350ML IV SOLN
1750.0000 mg | INTRAVENOUS | Status: DC
  Administered 2022-08-03: 1750 mg via INTRAVENOUS
  Filled 2022-08-02: qty 350

## 2022-08-02 MED ORDER — BUPIVACAINE HCL (PF) 0.5 % IJ SOLN
INTRAMUSCULAR | Status: AC
  Filled 2022-08-02: qty 30

## 2022-08-02 MED ORDER — OXYCODONE HCL 5 MG PO TABS: 5.0000 mg | ORAL_TABLET | Freq: Once | ORAL | Status: DC | PRN

## 2022-08-02 MED ORDER — MORPHINE SULFATE (PF) 2 MG/ML IV SOLN
2.0000 mg | INTRAVENOUS | Status: DC | PRN
  Administered 2022-08-02: 2 mg via INTRAVENOUS
  Filled 2022-08-02 (×3): qty 1

## 2022-08-02 MED ORDER — OXYCODONE HCL 5 MG/5ML PO SOLN: 5.0000 mg | Freq: Once | ORAL | Status: DC | PRN

## 2022-08-02 MED ORDER — PROPOFOL 10 MG/ML IV BOLUS
INTRAVENOUS | Status: AC
  Filled 2022-08-02: qty 20

## 2022-08-02 MED ORDER — PHENYLEPHRINE HCL-NACL 20-0.9 MG/250ML-% IV SOLN
INTRAVENOUS | Status: DC | PRN
  Administered 2022-08-02: 30 ug/min via INTRAVENOUS

## 2022-08-02 MED ORDER — PROPOFOL 1000 MG/100ML IV EMUL
INTRAVENOUS | Status: AC
  Filled 2022-08-02: qty 100

## 2022-08-02 MED ORDER — LIDOCAINE HCL 1 % IJ SOLN
INTRAMUSCULAR | Status: AC
  Filled 2022-08-02: qty 20

## 2022-08-02 MED ORDER — SODIUM CHLORIDE 0.9 % IV SOLN
2.0000 g | INTRAVENOUS | Status: DC
  Administered 2022-08-02 – 2022-08-03 (×2): 2 g via INTRAVENOUS
  Filled 2022-08-02 (×2): qty 20

## 2022-08-02 MED ORDER — FENTANYL CITRATE (PF) 100 MCG/2ML IJ SOLN
INTRAMUSCULAR | Status: DC | PRN
  Administered 2022-08-02: 50 ug via INTRAVENOUS

## 2022-08-02 MED ORDER — PROPOFOL 500 MG/50ML IV EMUL
INTRAVENOUS | Status: DC | PRN
  Administered 2022-08-02: 50 ug/kg/min via INTRAVENOUS

## 2022-08-02 MED ORDER — GLYCOPYRROLATE 0.2 MG/ML IJ SOLN
INTRAMUSCULAR | Status: DC | PRN
  Administered 2022-08-02 (×2): .1 mg via INTRAVENOUS

## 2022-08-02 MED ORDER — CEFAZOLIN SODIUM-DEXTROSE 2-4 GM/100ML-% IV SOLN
INTRAVENOUS | Status: AC
  Filled 2022-08-02: qty 100

## 2022-08-02 MED ORDER — PROPOFOL 10 MG/ML IV BOLUS
INTRAVENOUS | Status: DC | PRN
  Administered 2022-08-02: 30 mg via INTRAVENOUS
  Administered 2022-08-02: 10 mg via INTRAVENOUS

## 2022-08-02 MED ORDER — CEFAZOLIN SODIUM-DEXTROSE 2-3 GM-%(50ML) IV SOLR
INTRAVENOUS | Status: DC | PRN
  Administered 2022-08-02: 2 g via INTRAVENOUS

## 2022-08-02 MED ORDER — VANCOMYCIN HCL 2000 MG/400ML IV SOLN
2000.0000 mg | Freq: Once | INTRAVENOUS | Status: AC
  Administered 2022-08-02: 2000 mg via INTRAVENOUS
  Filled 2022-08-02: qty 400

## 2022-08-02 MED ORDER — FENTANYL CITRATE PF 50 MCG/ML IJ SOSY: 25.0000 ug | PREFILLED_SYRINGE | INTRAMUSCULAR | Status: DC | PRN

## 2022-08-02 MED ORDER — ONDANSETRON HCL 4 MG/2ML IJ SOLN: 4.0000 mg | Freq: Once | INTRAMUSCULAR | Status: DC | PRN

## 2022-08-02 MED ORDER — ONDANSETRON HCL 4 MG/2ML IJ SOLN
INTRAMUSCULAR | Status: AC
  Filled 2022-08-02: qty 2

## 2022-08-02 MED ORDER — LACTATED RINGERS IV SOLN: INTRAVENOUS | Status: DC | PRN

## 2022-08-02 MED ORDER — 0.9 % SODIUM CHLORIDE (POUR BTL) OPTIME
TOPICAL | Status: DC | PRN
  Administered 2022-08-02: 1000 mL

## 2022-08-02 MED ORDER — FENTANYL CITRATE (PF) 100 MCG/2ML IJ SOLN
INTRAMUSCULAR | Status: AC
  Filled 2022-08-02: qty 2

## 2022-08-02 SURGICAL SUPPLY — 39 items
APL PRP STRL LF DISP 70% ISPRP (MISCELLANEOUS) ×1
BAG COUNTER SPONGE SURGICOUNT (BAG) IMPLANT
BAG SPNG CNTER NS LX DISP (BAG)
BLADE HEX COATED 2.75 (ELECTRODE) ×1 IMPLANT
BLADE OSCILLATING/SAGITTAL (BLADE) ×1
BLADE SW THK.38XMED LNG THN (BLADE) ×1 IMPLANT
BNDG CMPR 5X3 KNIT ELC UNQ LF (GAUZE/BANDAGES/DRESSINGS) ×1
BNDG CMPR 75X21 PLY HI ABS (MISCELLANEOUS) ×1
BNDG CMPR 9X4 STRL LF SNTH (GAUZE/BANDAGES/DRESSINGS) ×1
BNDG ELASTIC 3INX 5YD STR LF (GAUZE/BANDAGES/DRESSINGS) ×1 IMPLANT
BNDG ESMARK 4X9 LF (GAUZE/BANDAGES/DRESSINGS) ×1 IMPLANT
BNDG GAUZE DERMACEA FLUFF 4 (GAUZE/BANDAGES/DRESSINGS) ×1 IMPLANT
BNDG GZE DERMACEA 4 6PLY (GAUZE/BANDAGES/DRESSINGS) ×1
CHLORAPREP W/TINT 26 (MISCELLANEOUS) ×1 IMPLANT
CNTNR URN SCR LID CUP LEK RST (MISCELLANEOUS) IMPLANT
CONT SPEC 4OZ STRL OR WHT (MISCELLANEOUS)
ELECT REM PT RETURN 15FT ADLT (MISCELLANEOUS) ×1 IMPLANT
GAUZE SPONGE 4X4 12PLY STRL (GAUZE/BANDAGES/DRESSINGS) ×1 IMPLANT
GAUZE STRETCH 2X75IN STRL (MISCELLANEOUS) ×1 IMPLANT
GAUZE XEROFORM 1X8 LF (GAUZE/BANDAGES/DRESSINGS) IMPLANT
GLOVE BIO SURGEON STRL SZ7.5 (GLOVE) ×2 IMPLANT
GLOVE BIOGEL PI IND STRL 7.5 (GLOVE) ×1 IMPLANT
GOWN STRL REUS W/ TWL LRG LVL3 (GOWN DISPOSABLE) ×1 IMPLANT
GOWN STRL REUS W/ TWL XL LVL3 (GOWN DISPOSABLE) ×1 IMPLANT
GOWN STRL REUS W/TWL LRG LVL3 (GOWN DISPOSABLE) ×1
GOWN STRL REUS W/TWL XL LVL3 (GOWN DISPOSABLE) ×1
KIT BASIN OR (CUSTOM PROCEDURE TRAY) ×1 IMPLANT
KIT TURNOVER KIT A (KITS) IMPLANT
NDL HYPO 25X1 1.5 SAFETY (NEEDLE) ×1 IMPLANT
NDL SAFETY ECLIP 18X1.5 (MISCELLANEOUS) IMPLANT
NEEDLE HYPO 25X1 1.5 SAFETY (NEEDLE) ×1 IMPLANT
NS IRRIG 1000ML POUR BTL (IV SOLUTION) IMPLANT
PACK ORTHO EXTREMITY (CUSTOM PROCEDURE TRAY) IMPLANT
PENCIL SMOKE EVACUATOR (MISCELLANEOUS) IMPLANT
SUT ETHILON 3 0 PS 1 (SUTURE) IMPLANT
SUT MNCRL AB 3-0 PS2 18 (SUTURE) ×1 IMPLANT
SUT PROLENE 2 0 SH DA (SUTURE) IMPLANT
SYR 20ML LL LF (SYRINGE) ×1 IMPLANT
UNDERPAD 30X36 HEAVY ABSORB (UNDERPADS AND DIAPERS) ×1 IMPLANT

## 2022-08-02 NOTE — Interval H&P Note (Signed)
History and Physical Interval Note:  08/02/2022 10:31 AM  Pedro Lee  has presented today for surgery, with the diagnosis of OSTEOMYELITIS.  The various methods of treatment have been discussed with the patient and family. After consideration of risks, benefits and other options for treatment, the patient has consented to  Procedure(s): LEFT GREAT TOE AMPUTATION (Left) as a surgical intervention.  The patient's history has been reviewed, patient examined, no change in status, stable for surgery.  I have reviewed the patient's chart and labs.  Questions were answered to the patient's satisfaction.     Vivi Barrack

## 2022-08-02 NOTE — Anesthesia Procedure Notes (Signed)
Anesthesia Regional Block: Ankle block   Pre-Anesthetic Checklist: , timeout performed,  Correct Patient, Correct Site, Correct Laterality,  Correct Procedure, Correct Position, site marked,  Risks and benefits discussed,  Surgical consent,  Pre-op evaluation,  At surgeon's request and post-op pain management  Laterality: Left  Prep: chloraprep       Needles:  Injection technique: Single-shot  Needle Type: Other     Needle Length: 5cm  Needle Gauge: 25     Additional Needles:   Narrative:  Start time: 08/02/2022 10:07 AM End time: 08/02/2022 10:10 AM Injection made incrementally with aspirations every 5 mL.  Performed by: Personally  Anesthesiologist: Eilene Ghazi, MD  Additional Notes: Patient tolerated the procedure well without complications

## 2022-08-02 NOTE — Progress Notes (Signed)
ABI exam attempted, patient going to surgery.  Will reattempt exam as patient availability and schedule permits.   Results can be found under chart review under CV PROC. 08/02/2022 10:13 AM Camauri Craton RVT, RDMS

## 2022-08-02 NOTE — Op Note (Signed)
PATIENT:  Pedro Lee  82 y.o. male   PRE-OPERATIVE DIAGNOSIS:  OSTEOMYELITIS   POST-OPERATIVE DIAGNOSIS:  OSTEOMYELITIS   PROCEDURE:  Procedure(s): LEFT GREAT TOE AMPUTATION (Left)   SURGEON:  Surgeon(s) and Role:    * Vivi Barrack, DPM - Primary   PHYSICIAN ASSISTANT:    ASSISTANTS: none    ANESTHESIA:   MAC   EBL:  10 ml    BLOOD ADMINISTERED:none   DRAINS: none    LOCAL MEDICATIONS USED:  NONE   SPECIMEN:  Source of Specimen:  toe for pathology; tissue culture; wound culture   DISPOSITION OF SPECIMEN:  PATHOLOGY   COUNTS:  YES   TOURNIQUET: Anatomic dissection   DICTATION: .Dragon Dictation   PLAN OF CARE: Admit to inpatient    PATIENT DISPOSITION:  PACU - hemodynamically stable.   Delay start of Pharmacological VTE agent (>24hrs) due to surgical blood loss or risk of bleeding: no   Intraoperative findings: S/p partial hallux amputation. Proximal bone appeared viable. Sent the above cultures. Incision closed without tension.    Post-op course: Plan for a week of oral antibiotics upon discharge for any residual soft tissue infection WBAT in surgical shoe (although limited). Plan for dressing change tomorrow. ABI pending. Podiatry will continue to follow.   Indications for surgery: 82 year old male admitted to the hospital and found to have infection of his left big toe, osteomyelitis.  We discussed trying to salvage the toe with long-term antibiotics versus amputation.  I recommend amputation he also further discussed with his son and they agreed to proceed with amputation of the toe.  All alternatives, risks, complications were discussed.  No promises or guarantees given the fact, the procedure all questions answered to the best of my ability.  Procedure in detail: The patient was both verbally and visually identified by myself, the nursing staff, the anesthesia staff.  He was then transferred to the operating room via stretcher with the surgery took  place.  After adequate plane of anesthesia was obtained the left lower extremities and scrubbed, prepped, draped in normal sterile fashion.  He did have a nerve block performed by anesthesia preoperatively.  Due to the stress of the left hallux in which a fishmouth type incision was made along the hallux IPJ just proximal to the area where the wound was at.  Incision was made from skin to bone with a 15 blade scalpel.  The hallux IPJ was identified the distal phalanx was disarticulated and passed off the table to be sent to pathology.  Remaining proximal phalanx appeared be viable.  It was hard in nature, white in color.  There is no purulence around this area there is no proximal tracking noted.  I did take a culture of the wound at this time.  I also did take some tissue from the toe and sent this for tissue culture as well.  I copiously irrigated the wound with saline and hemostasis achieved.  Incision was closed with 4-0 nylon in a simple interrupted fashion without any tension.  Xeroform was applied followed by dry sterile dressing.  He was transferred to PACU vital signs stable and vascular status intact.  Post-op course: Patient received Ancef after the cultures were obtained.  Recommend antibiotics.  Follow-up on tissue culture, wound culture, toe pathology and would anticipate a week of oral antibiotics upon discharge.  ABIs pending.  He is weightbearing as tolerated although limited in postop shoe.  Podiatry will continue to follow.

## 2022-08-02 NOTE — Brief Op Note (Signed)
08/02/2022  11:15 AM  PATIENT:  Pedro Lee  82 y.o. male  PRE-OPERATIVE DIAGNOSIS:  OSTEOMYELITIS  POST-OPERATIVE DIAGNOSIS:  OSTEOMYELITIS  PROCEDURE:  Procedure(s): LEFT GREAT TOE AMPUTATION (Left)  SURGEON:  Surgeon(s) and Role:    * Vivi Barrack, DPM - Primary  PHYSICIAN ASSISTANT:   ASSISTANTS: none   ANESTHESIA:   MAC  EBL:  10 ml   BLOOD ADMINISTERED:none  DRAINS: none   LOCAL MEDICATIONS USED:  NONE  SPECIMEN:  Source of Specimen:  toe for pathology; tissue culture; wound culture  DISPOSITION OF SPECIMEN:  PATHOLOGY  COUNTS:  YES  TOURNIQUET:  * Missing tourniquet times found for documented tourniquets in log: [id number] *  DICTATION: .Reubin Milan Dictation  PLAN OF CARE: Admit to inpatient   PATIENT DISPOSITION:  PACU - hemodynamically stable.   Delay start of Pharmacological VTE agent (>24hrs) due to surgical blood loss or risk of bleeding: no  Intraoperative findings: S/p partial hallux amputation. Proximal bone appeared viable. Sent the above cultures. Incision closed without tension.   Post-op course: Plan for a week of oral antibiotics upon discharge for any residual soft tissue infection WBAT in surgical shoe (although limited). Plan for dressing change tomorrow. ABI pending. Podiatry will continue to follow.

## 2022-08-02 NOTE — Progress Notes (Signed)
Pharmacy Antibiotic Note  Pedro Lee is a 82 y.o. male admitted on 07/31/2022 with  OM .  Pharmacy has been consulted for Vanco dosing.  ID: OM s/p L great toe amputation 6/23. Afebrile, WBC 8.4, Scr 1.26  Vanco 6/23>> Rocephin 6/23>>   Plan: Vanco 2g IV x 1 then Vancomycin 1750 mg IV Q 24 hrs. Goal AUC 400-550. Expected AUC: 476 SCr used: 1.26    Height: 6\' 5"  (195.6 cm) Weight: (!) 142 kg (313 lb) IBW/kg (Calculated) : 89.1  Temp (24hrs), Avg:98.1 F (36.7 C), Min:97.7 F (36.5 C), Max:98.4 F (36.9 C)  Recent Labs  Lab 07/31/22 1711 08/01/22 0354 08/01/22 0519  WBC 7.4 8.4  --   CREATININE 1.34*  --  1.26*    Estimated Creatinine Clearance: 70.5 mL/min (A) (by C-G formula based on SCr of 1.26 mg/dL (H)).    Allergies  Allergen Reactions   Penicillins Rash    Pedro Lee S. Pedro Lee, PharmD, BCPS Clinical Staff Pharmacist [email or web address]  Pedro Lee 08/02/2022 2:57 PM

## 2022-08-02 NOTE — Anesthesia Preprocedure Evaluation (Addendum)
Anesthesia Evaluation  Patient identified by MRN, date of birth, ID band Patient awake    Reviewed: Allergy & Precautions, H&P , NPO status , Patient's Chart, lab work & pertinent test results  Airway Mallampati: II  TM Distance: <3 FB Neck ROM: Full    Dental no notable dental hx.    Pulmonary neg pulmonary ROS   Pulmonary exam normal breath sounds clear to auscultation       Cardiovascular hypertension, Pt. on medications + Orthopnea  Normal cardiovascular exam Rhythm:Regular Rate:Normal     Neuro/Psych negative neurological ROS  negative psych ROS   GI/Hepatic negative GI ROS, Neg liver ROS,,,  Endo/Other    Morbid obesity  Renal/GU Renal InsufficiencyRenal disease  negative genitourinary   Musculoskeletal negative musculoskeletal ROS (+)    Abdominal   Peds negative pediatric ROS (+)  Hematology negative hematology ROS (+)   Anesthesia Other Findings   Reproductive/Obstetrics negative OB ROS                             Anesthesia Physical Anesthesia Plan  ASA: 3  Anesthesia Plan: MAC   Post-op Pain Management:    Induction: Intravenous  PONV Risk Score and Plan: 1 and Propofol infusion and Treatment may vary due to age or medical condition  Airway Management Planned: Simple Face Mask  Additional Equipment:   Intra-op Plan:   Post-operative Plan:   Informed Consent: I have reviewed the patients History and Physical, chart, labs and discussed the procedure including the risks, benefits and alternatives for the proposed anesthesia with the patient or authorized representative who has indicated his/her understanding and acceptance.     Dental advisory given  Plan Discussed with: CRNA and Surgeon  Anesthesia Plan Comments:        Anesthesia Quick Evaluation

## 2022-08-02 NOTE — Progress Notes (Signed)
Received call from RN that pain is not controlled with the oral medication. Morphine 2mg  q4h for breakthrough ordered.

## 2022-08-02 NOTE — Progress Notes (Signed)
  Progress Note   Patient: Pedro Lee ZOX:[id number] DOB: 07-14-1940 DOA: 07/31/2022     2 DOS: the patient was seen and examined on 08/02/2022   Brief hospital course: 83 year old man PMH including CKD, presented with left first toe wound.  Further evaluation revealed osteomyelitis left great toe.  Admitted for podiatry consultation.   Consultants Podiatry  Procedures 6/23 LEFT GREAT TOE AMPUTATION (Left)   Assessment and Plan: * Osteomyelitis of great toe of left foot (HCC) MRI showed osteomyelitis of the distal left great toe.  Patient is hemodynamically stable, afebrile, without leukocytosis. Status-post partial toe amputation. Proximal bone looked good. Sent the toe for path, wound culture and tissue culture.  Podiatry will change the bandage tomorrow.  Start IV abx and will plan 1 week of oral antibiotics upon discharge to clean up any soft tissue infection.    Chronic diastolic CHF (congestive heart failure) (HCC) Appears compensated.  TTE 08/2021 showed EF 65-70%.  Continue Coreg, Lasix 40 mg daily prn.   Chronic kidney disease, stage 3a (HCC) Stable.   Essential hypertension Stable. Continue amlodipine, Coreg, Cardura.   Hyperlipidemia Continue rosuvastatin.      Subjective:  Feels ok  Physical Exam: Vitals:   08/02/22 1130 08/02/22 1145 08/02/22 1214 08/02/22 1307  BP: 95/71  98/77 108/87  Pulse: (!) 59 63 69 74  Resp: 14 12 14 16   Temp:    97.7 F (36.5 C)  TempSrc:    Oral  SpO2: 95% 92%  97%  Weight:      Height:       Physical Exam Vitals reviewed.  Constitutional:      General: He is not in acute distress.    Appearance: He is not ill-appearing or toxic-appearing.  Cardiovascular:     Rate and Rhythm: Normal rate and regular rhythm.     Heart sounds: No murmur heard. Pulmonary:     Effort: Pulmonary effort is normal. No respiratory distress.     Breath sounds: No wheezing, rhonchi or rales.  Neurological:     Mental Status: He is alert.   Psychiatric:        Mood and Affect: Mood normal.        Behavior: Behavior normal.     Data Reviewed: No new data  Family Communication: son at bedside  Disposition: Status is: Inpatient Remains inpatient appropriate because: s/p surgery  Planned Discharge Destination: Home    Time spent: 20 minutes  Author: Brendia Sacks, MD 08/02/2022 5:51 PM  For on call review [email or web address].

## 2022-08-02 NOTE — Transfer of Care (Signed)
Immediate Anesthesia Transfer of Care Note  Patient: Pedro Lee  Procedure(s) Performed: LEFT GREAT TOE AMPUTATION (Left: Toe)  Patient Location: PACU  Anesthesia Type:Regional and MAC combined with regional for post-op pain  Level of Consciousness: awake, alert , oriented, and patient cooperative  Airway & Oxygen Therapy: Patient Spontanous Breathing and Patient connected to face mask oxygen  Post-op Assessment: Report given to RN and Post -op Vital signs reviewed and stable  Post vital signs: Reviewed and stable  Last Vitals:  Vitals Value Taken Time  BP 93/60 08/02/22 1115  Temp    Pulse 60 08/02/22 1118  Resp 16 08/02/22 1118  SpO2 100 % 08/02/22 1118  Vitals shown include unvalidated device data.  Last Pain:  Vitals:   08/02/22 0950  TempSrc:   PainSc: 0-No pain         Complications: No notable events documented.

## 2022-08-02 NOTE — Anesthesia Postprocedure Evaluation (Signed)
Anesthesia Post Note  Patient: Pedro Lee  Procedure(s) Performed: LEFT GREAT TOE AMPUTATION (Left: Toe)     Patient location during evaluation: PACU Anesthesia Type: MAC Level of consciousness: awake and alert Pain management: pain level controlled Vital Signs Assessment: post-procedure vital signs reviewed and stable Respiratory status: spontaneous breathing, nonlabored ventilation, respiratory function stable and patient connected to nasal cannula oxygen Cardiovascular status: stable and blood pressure returned to baseline Postop Assessment: no apparent nausea or vomiting Anesthetic complications: no  No notable events documented.  Last Vitals:  Vitals:   08/02/22 0950 08/02/22 1115  BP: 124/88   Pulse: 71   Resp: 18   Temp: 36.7 C 36.7 C  SpO2: 96%     Last Pain:  Vitals:   08/02/22 0950  TempSrc:   PainSc: 0-No pain                 Annasophia Crocker S

## 2022-08-03 ENCOUNTER — Encounter (HOSPITAL_COMMUNITY): Payer: Self-pay | Admitting: Podiatry

## 2022-08-03 ENCOUNTER — Inpatient Hospital Stay (HOSPITAL_COMMUNITY): Payer: Medicare PPO

## 2022-08-03 DIAGNOSIS — I7090 Unspecified atherosclerosis: Secondary | ICD-10-CM

## 2022-08-03 DIAGNOSIS — I10 Essential (primary) hypertension: Secondary | ICD-10-CM | POA: Diagnosis not present

## 2022-08-03 DIAGNOSIS — I5032 Chronic diastolic (congestive) heart failure: Secondary | ICD-10-CM | POA: Diagnosis not present

## 2022-08-03 DIAGNOSIS — M869 Osteomyelitis, unspecified: Secondary | ICD-10-CM | POA: Diagnosis not present

## 2022-08-03 LAB — AEROBIC/ANAEROBIC CULTURE W GRAM STAIN (SURGICAL/DEEP WOUND): Gram Stain: NONE SEEN

## 2022-08-03 LAB — CREATININE, SERUM
Creatinine, Ser: 1.47 mg/dL — ABNORMAL HIGH (ref 0.61–1.24)
GFR, Estimated: 47 mL/min — ABNORMAL LOW (ref >60)

## 2022-08-03 MED ORDER — CEFUROXIME AXETIL 500 MG PO TABS: 500.0000 mg | ORAL_TABLET | Freq: Two times a day (BID) | ORAL | 0 refills | Status: AC

## 2022-08-03 MED ORDER — DOXYCYCLINE HYCLATE 50 MG PO CAPS: 100.0000 mg | ORAL_CAPSULE | Freq: Two times a day (BID) | ORAL | 0 refills | Status: DC

## 2022-08-03 MED ORDER — OXYCODONE HCL 5 MG PO TABS: 5.0000 mg | ORAL_TABLET | Freq: Four times a day (QID) | ORAL | 0 refills | Status: DC | PRN

## 2022-08-03 NOTE — Progress Notes (Signed)
  Progress Note   Patient: Pedro Lee [id number]:[id number] DOB: 08/18/1940 DOA: 07/31/2022     3 DOS: the patient was seen and examined on 08/03/2022   Brief hospital course: 82 year old man PMH including CKD, presented with left first toe wound.  Further evaluation revealed osteomyelitis left great toe.  Admitted for podiatry consultation.   Consultants Podiatry  Procedures 6/23 LEFT GREAT TOE AMPUTATION (Left)   Assessment and Plan: * Osteomyelitis of great toe of left foot (HCC) MRI showed osteomyelitis of the distal left great toe.  Patient is hemodynamically stable, afebrile, without leukocytosis. Status-post partial toe amputation. Proximal bone looked good. Sent the toe for path, wound culture and tissue culture.  Podiatry will change the bandage tomorrow.  On IV abx as inpatient and will plan 1 week of oral antibiotics upon discharge to clean up any soft tissue infection.    Chronic diastolic CHF (congestive heart failure) (HCC) Appears compensated.  TTE 08/2021 showed EF 65-70%.  Continue Coreg, Lasix 40 mg daily prn.   Chronic kidney disease, stage 3a (HCC) Stable.   Essential hypertension Stable. Continue amlodipine, Coreg, Cardura.   Hyperlipidemia Continue rosuvastatin.  Home today if cleared by podiatry     Subjective:  Feels fine Would like to go home  Physical Exam: Vitals:   08/02/22 1307 08/02/22 2105 08/03/22 0205 08/03/22 0644  BP: 108/87 104/76 111/73 116/85  Pulse: 74 64 60 70  Resp: 16 17 17 17   Temp: 97.7 F (36.5 C) 98.4 F (36.9 C) 98.9 F (37.2 C) 98.5 F (36.9 C)  TempSrc: Oral Oral Oral Oral  SpO2: 97% 97% 99% 99%  Weight:      Height:       Physical Exam Vitals reviewed.  Constitutional:      General: He is not in acute distress.    Appearance: He is not ill-appearing or toxic-appearing.  Cardiovascular:     Rate and Rhythm: Normal rate and regular rhythm.     Heart sounds: No murmur heard. Pulmonary:     Effort: Pulmonary  effort is normal. No respiratory distress.     Breath sounds: No wheezing, rhonchi or rales.  Neurological:     Mental Status: He is alert.  Psychiatric:        Mood and Affect: Mood normal.        Behavior: Behavior normal.     Data Reviewed: No new data Wound culture pending [facility] Surgical pathology pending  Family Communication: son at bedside  Disposition: Status is: Inpatient Remains inpatient appropriate because: s/p surgery  Planned Discharge Destination: Home with Home Health    Time spent: 20 minutes  Author: Brendia Sacks, MD 08/03/2022 8:44 AM  For on call review [email or web address].

## 2022-08-03 NOTE — Progress Notes (Signed)
   Subjective: 1 Day Post-Op Procedure(s) (LRB): LEFT GREAT TOE AMPUTATION (Left) DOS: 08/02/2022.  Dr. Ardelle Anton  Patient doing well.  Anxious to be discharged.  No pain.  Currently WBAT in a postsurgical shoe.  Dressings are clean dry and intact  Objective: Vital signs in last 24 hours: Temp:  [98.4 F (36.9 C)-98.9 F (37.2 C)] 98.4 F (36.9 C) (06/24 1348) Pulse Rate:  [60-77] 77 (06/24 1709) Resp:  [17] 17 (06/24 1348) BP: (104-144)/(73-95) 144/95 (06/24 1709) SpO2:  [97 %-100 %] 100 % (06/24 1348)  Recent Labs    08/01/22 0354  HGB 12.8*   Recent Labs    08/01/22 0354  WBC 8.4  RBC 4.55  HCT 39.9  PLT 192   Recent Labs    08/01/22 0519 08/03/22 0839  NA 139  --   K 3.9  --   CL 109  --   CO2 22  --   BUN 14  --   CREATININE 1.26* 1.47*  GLUCOSE 94  --   CALCIUM 9.1  --    No results for input(s): "LABPT", "INR" in the last 72 hours.  Physical Exam: Amputation site well coapted with sutures intact.  No purulence.  No active bleeding.  Overall well-healing surgical amputation site  Assessment/Plan: 1 Day Post-Op Procedure(s) (LRB): LEFT GREAT TOE AMPUTATION (Left) DOS: 08/02/2022.  Dr. Ovid Curd  -Dressings changed.  Leave clean dry and intact until follow-up in the office 1 week from today -WBAT surgical shoe heel pressure mostly -Recommend DC on oral antibiotics.  Cultures pending -Podiatry will sign off    Felecia Shelling 08/03/2022, 6:54 PM  Felecia Shelling, DPM [facility]  Dr. Felecia Shelling, DPM    [address] [place], [place] [address]                Office [phone]  Fax [phone]

## 2022-08-03 NOTE — Progress Notes (Signed)
ABI study completed.  ° °Please see CV Proc for preliminary results.  ° °Trini Christiansen, RDMS, RVT ° °

## 2022-08-03 NOTE — Progress Notes (Signed)
Orthopedic Tech Progress Note Patient Details:  Pedro Lee 02/08/1941 [id number]  Ortho Devices Type of Ortho Device: Postop shoe/boot Ortho Device/Splint Location: LLE Ortho Device/Splint Interventions: Ordered, Application, Adjustment   Post Interventions Patient Tolerated: Well Instructions Provided: Care of device, Adjustment of device  [name] 08/03/2022, 9:35 AM

## 2022-08-03 NOTE — Evaluation (Signed)
Physical Therapy Evaluation Patient Details Name: Pedro Lee MRN: [id number] DOB: [birth year] Today's Date: 08/03/2022  History of Present Illness  82 yo male presents to ED on 07/31/2022 due to worsening L great toe pain, edema and drainage. Imaging concerning for osteomyelitis and podiatry consult. Pt underwent L great toe amputation on 08/02/2022 and is currently L LE WBAT with post op shoe donned. Pt  has PMH including but not limited to: dCHF, HTN, gout, OSA, OA, HDL, B TKA, and  CKD III.  Clinical Impression    Pt admitted with above diagnosis.  Pt currently with functional limitations due to the deficits listed below (see PT Problem List). Pt seated EOB when PT arrived, one son present and another arrived during tx session. Pt is mod I for bed mobility, S for transfers with use of RW, Min guard for use of SPC from a variety of surfaces. Pt required min guard for gait tasks with RW 60 feet and with SPC 60 feet, pt and PT noted improved stability with use of RW and pt will benefit from big and tall RW in home setting for safety. Pt required min guard and cues for step navigation with SPC R UE and 1 handrail, PT able to provide pt and family ed on [facility] and HHA to navigate 2 steps in home setting. Pt and sons demonstrated verbal understanding. Pt ed provided on LLE elevation, donning and doffing of L post op shoe, and LLE WBAT with post op shoe donned and pt encouraged to wear R standard shoe for improved stability with functional mobility tasks. Pt left seated in recliner, family present all needs in place and instructed to use call bell and await staff S and A. Pt will benefit from acute skilled PT to increase their independence and safety with mobility to allow discharge.        Recommendations for follow up therapy are one component of a multi-disciplinary discharge planning process, led by the attending physician.  Recommendations may be updated based on patient status, additional functional  criteria and insurance authorization.  Follow Up Recommendations       Assistance Recommended at Discharge Intermittent Supervision/Assistance  Patient can return home with the following  A little help with walking and/or transfers;Assist for transportation;Help with stairs or ramp for entrance;Assistance with cooking/housework    Equipment Recommendations Rolling walker (2 wheels) (Big and tall)  Recommendations for Other Services       Functional Status Assessment Patient has had a recent decline in their functional status and demonstrates the ability to make significant improvements in function in a reasonable and predictable amount of time.     Precautions / Restrictions Precautions Precautions: Fall Restrictions Weight Bearing Restrictions: Yes LLE Weight Bearing: Weight bearing as tolerated Other Position/Activity Restrictions: with post op shoe donned      Mobility  Bed Mobility Overal bed mobility: Modified Independent             General bed mobility comments: HOB elevated    Transfers Overall transfer level: Needs assistance Equipment used: Rolling walker (2 wheels) Transfers: Sit to/from Stand Sit to Stand: Supervision           General transfer comment: min cues for safety with RW and SPC bed, recliner and standard chair    Ambulation/Gait Ambulation/Gait assistance: Min guard Gait Distance (Feet): 60 Feet Assistive device: Rolling walker (2 wheels) Gait Pattern/deviations: Step-to pattern Gait velocity: decreased     General Gait Details: R toe out, gait assessed  with RW and pt will require big and tall RW for home setting and gait then assessed with SPC, noted improved stability with use of RW vs SPC and pt in agreement, pt ed provdied on donning and doffing L post op shoe. PT recomends wearing standard shoe on R and post op on L for improved balance with functional mobility tasks  Stairs Stairs: Yes Stairs assistance: Min guard Stair  Management: One rail Right, With cane Number of Stairs: 2 General stair comments: cues for proper sequencing and safety with use of SPC and pt electing to perfrom reciprocal pattern, pt and family ed provided on use of SPC and HHA for safety with step navigation. pt reports will be installing handrail for steps in garage  Wheelchair Mobility    Modified Rankin (Stroke Patients Only)       Balance Overall balance assessment: Mild deficits observed, not formally tested (no hx of falls)                                           Pertinent Vitals/Pain Pain Assessment Pain Assessment: 0-10 Pain Score: 6  Pain Location: distal L LE Pain Descriptors / Indicators: Aching, Constant, Discomfort Pain Intervention(s): Monitored during session, Limited activity within patient's tolerance, Premedicated before session, Repositioned    Home Living Family/patient expects to be discharged to:: Private residence Living Arrangements: Alone Available Help at Discharge: Family Type of Home: House Home Access: Stairs to enter Entrance Stairs-Rails: None Secretary/administrator of Steps: 2   Home Layout: One level Home Equipment: Cane - single point;Crutches;Grab bars - toilet      Prior Function Prior Level of Function : Independent/Modified Independent             Mobility Comments: mod I with use of SPC for all ADLs, self care tasks, IADLs and driving       Hand Dominance        Extremity/Trunk Assessment        Lower Extremity Assessment Lower Extremity Assessment: Overall WFL for tasks assessed (R ankle deficits noted significant R toe out)    Cervical / Trunk Assessment Cervical / Trunk Assessment:  (wfl)  Communication   Communication: No difficulties  Cognition Arousal/Alertness: Awake/alert Behavior During Therapy: WFL for tasks assessed/performed Overall Cognitive Status: Within Functional Limits for tasks assessed                                           General Comments      Exercises     Assessment/Plan    PT Assessment Patient needs continued PT services  PT Problem List Decreased activity tolerance;Decreased balance;Decreased mobility;Decreased coordination;Pain       PT Treatment Interventions DME instruction;Gait training;Stair training;Functional mobility training;Therapeutic exercise;Therapeutic activities;Balance training;Neuromuscular re-education;Patient/family education    PT Goals (Current goals can be found in the Care Plan section)  Acute Rehab PT Goals Patient Stated Goal: to go home and get stronger return to use of [facility] PT Goal Formulation: With patient Time For Goal Achievement: 08/17/22 Potential to Achieve Goals: Good    Frequency Min 1X/week     Co-evaluation               AM-PAC PT "6 Clicks" Mobility  Outcome Measure Help needed turning from your back  to your side while in a flat bed without using bedrails?: None Help needed moving from lying on your back to sitting on the side of a flat bed without using bedrails?: None Help needed moving to and from a bed to a chair (including a wheelchair)?: A Little Help needed standing up from a chair using your arms (e.g., wheelchair or bedside chair)?: A Little Help needed to walk in hospital room?: A Little Help needed climbing 3-5 steps with a railing? : A Little 6 Click Score: 20    End of Session Equipment Utilized During Treatment: Gait belt (L post op shoe) Activity Tolerance: No increased pain;Patient tolerated treatment well Patient left: in chair;with call bell/phone within reach;with family/visitor present Nurse Communication: Mobility status PT Visit Diagnosis: Unsteadiness on feet (R26.81);Other abnormalities of gait and mobility (R26.89);Pain Pain - Right/Left: Left Pain - part of body: Ankle and joints of foot    Time: 1610-9604 PT Time Calculation (min) (ACUTE ONLY): 29 min   Charges:   PT  Evaluation $PT Eval Low Complexity: 1 Low PT Treatments $Gait Training: 8-22 mins $Therapeutic Activity: 8-22 mins        Johnny Bridge, PT Acute Rehab   Jacqualyn Posey 08/03/2022, 1:23 PM

## 2022-08-03 NOTE — Discharge Summary (Signed)
Physician Discharge Summary   Patient: Pedro Lee MRN: [id number] DOB: [birth year]  Admit date:     07/31/2022  Discharge date: 08/03/22  Discharge Physician: Brendia Sacks   PCP: Gwenyth Bender, MD   Recommendations at discharge:    Follow-up wound cultures  Discharge Diagnoses: Principal Problem:   Osteomyelitis of great toe of left foot (HCC) Active Problems:   Chronic diastolic CHF (congestive heart failure) (HCC)   Chronic kidney disease, stage 3a (HCC)   Essential hypertension   Hyperlipidemia  Resolved Problems:   * No resolved hospital problems. *  Hospital Course: 82 year old man PMH including CKD, presented with left first toe wound.  Further evaluation revealed osteomyelitis left great toe.  Admitted for podiatry consultation. Underwent surgery and was discharged home in good condition.  Consultants Podiatry  Procedures 6/23 LEFT GREAT TOE AMPUTATION (Left)   * Osteomyelitis of great toe of left foot (HCC) MRI showed osteomyelitis of the distal left great toe.  Patient is hemodynamically stable, afebrile, without leukocytosis. Status-post partial toe amputation. Proximal bone looked good. Sent the toe for path, wound culture and tissue culture.  Podiatry changed the bandage and cleared for discharge.  Will plan 1 week of oral antibiotics    Leave clean dry and intact until follow-up in the office 1 week from today WBAT surgical shoe heel pressure mostly   Chronic diastolic CHF (congestive heart failure) (HCC) Appears compensated.  TTE 08/2021 showed EF 65-70%.  Continue Coreg, Lasix 40 mg daily prn.   Chronic kidney disease, stage 3a (HCC) Stable.   Essential hypertension Stable. Continue amlodipine, Coreg, Cardura.   Hyperlipidemia Continue rosuvastatin.       Pain control - [employer] Controlled Substance Reporting System database was reviewed.    Disposition: Home health Diet recommendation:  Discharge Diet Orders (From admission,  onward)     Start     Ordered   08/03/22 0000  Diet - low sodium heart healthy        08/03/22 1857           Regular diet DISCHARGE MEDICATION: Allergies as of 08/03/2022       Reactions   Penicillins Rash        Medication List     STOP taking these medications    ciclopirox 8 % solution Commonly known as: Penlac   clotrimazole-betamethasone cream Commonly known as: Lotrisone   sulfamethoxazole-trimethoprim 800-160 MG tablet Commonly known as: BACTRIM DS       TAKE these medications    allopurinol 300 MG tablet Commonly known as: ZYLOPRIM Take 300 mg by mouth daily.   amLODipine 5 MG tablet Commonly known as: NORVASC TAKE 1 TABLET(5 MG) BY MOUTH AT BEDTIME   aspirin EC 81 MG tablet Take 81 mg by mouth daily.   carvedilol 12.5 MG tablet Commonly known as: COREG Take 1 tablet (12.5 mg total) by mouth 2 (two) times daily with a meal.   cefUROXime 500 MG tablet Commonly known as: CEFTIN Take 1 tablet (500 mg total) by mouth 2 (two) times daily for 7 days.   colchicine 0.6 MG tablet Take 0.6 mg by mouth daily.   doxazosin 2 MG tablet Commonly known as: Cardura Take 1 tablet (2 mg total) by mouth at bedtime. Please take one 2mg  tablet daily in the evening   doxycycline 50 MG capsule Commonly known as: VIBRAMYCIN Take 2 capsules (100 mg total) by mouth 2 (two) times daily.   fluticasone 50 MCG/ACT nasal spray Commonly known  as: FLONASE Place 2 sprays into both nostrils daily as needed for allergies or rhinitis.   furosemide 40 MG tablet Commonly known as: LASIX TAKE 1 TABLET BY MOUTH DAILY AS NEEDED FOR SWELLING What changed: See the new instructions.   hydrALAZINE 25 MG tablet Commonly known as: APRESOLINE Take 1 tablet (25 mg total) by mouth in the morning and at bedtime.   montelukast 10 MG tablet Commonly known as: SINGULAIR Take 10 mg by mouth daily.   oxyCODONE 5 MG immediate release tablet Commonly known as: Oxy  IR/ROXICODONE Take 1 tablet (5 mg total) by mouth every 6 (six) hours as needed for moderate pain or severe pain.   rosuvastatin 20 MG tablet Commonly known as: CRESTOR TAKE 1 TABLET(20 MG) BY MOUTH DAILY What changed: See the new instructions.   tamsulosin 0.4 MG Caps capsule Commonly known as: FLOMAX Take 0.4 mg by mouth daily.               Durable Medical Equipment  (From admission, onward)           Start     Ordered   08/03/22 1415  For home use only DME Walker tall  Once       Comments: Rolling, bariatric  Question:  Patient needs a walker to treat with the following condition  Answer:  Amputation of left great toe (HCC)   08/03/22 1415   08/03/22 1409  For home use only DME Walker rolling  Once       Comments: Tall rolling walker  Question Answer Comment  Walker: With 5 Inch Wheels   Patient needs a walker to treat with the following condition Post-operative pain      08/03/22 1410              Discharge Care Instructions  (From admission, onward)           Start     Ordered   08/03/22 0000  Discharge wound care:       Comments: As per podiatry   08/03/22 1857            Follow-up Information     [facility] Follow up.   Specialty: Home Health Services Why: Frances Furbish will provide PT in the home after discharge. Contact information: [address] [place] [address] [phone]         Vivi Barrack, DPM Follow up.   Specialty: Podiatry Why: Keep scheduled follow-up appointment Contact information: [address] [place] [address] [place] [address] [phone]                Discharge Exam: Ceasar Mons Weights   07/31/22 1551  Weight: (!) 142 kg   See progress note same day  Condition at discharge: good  The results of significant diagnostics from this hospitalization (including imaging, microbiology, ancillary and laboratory) are listed below for reference.   Imaging Studies: VAS  [place] ABI WITH/WO TBI  Result Date: 08/03/2022  LOWER EXTREMITY DOPPLER STUDY Patient Name:  Pedro Lee  Date of Exam:   08/03/2022 Medical Rec #: [id number]       Accession #:    [id number] Date of Birth: 09-29-1940        Patient Gender: M Patient Age:   50 years Exam Location:  [facility] Procedure:      VAS [place] ABI WITH/WO TBI Referring Phys: Ovid Curd --------------------------------------------------------------------------------  Indications: Ulceration. Left great toe osteomyelitis, s/p toe amputation  yesterday. Lower extremity edema limiting palpation of pulses on              clinical exam. High Risk Factors: Hypertension, hyperlipidemia.  Limitations: Today's exam was limited due to bandages. Comparison Study: 01/22/2020 Prior ABI was within normal limits bilaterally. Performing Technologist: Jean Rosenthal RDMS, RVT  Examination Guidelines: A complete evaluation includes at minimum, Doppler waveform signals and systolic blood pressure reading at the level of bilateral brachial, anterior tibial, and posterior tibial arteries, when vessel segments are accessible. Bilateral testing is considered an integral part of a complete examination. Photoelectric Plethysmograph (PPG) waveforms and toe systolic pressure readings are included as required and additional duplex testing as needed. Limited examinations for reoccurring indications may be performed as noted.  ABI Findings: +--------+------------------+-----+---------+--------+ Right   Rt Pressure (mmHg)IndexWaveform Comment  +--------+------------------+-----+---------+--------+ [id number]                    triphasic         +--------+------------------+-----+---------+--------+ PTA     136               1.24 triphasic         +--------+------------------+-----+---------+--------+ DP      131               1.19 triphasic         +--------+------------------+-----+---------+--------+  +--------+----------------+-----+---------+------------------------------------+ Left    Lt Pressure     IndexWaveform Comment                                      (mmHg)                                                             +--------+----------------+-----+---------+------------------------------------+ Brachial110                  triphasic                                     +--------+----------------+-----+---------+------------------------------------+ PTA     98              0.89 biphasic Bandaging affecting angle of                                               insonation                           +--------+----------------+-----+---------+------------------------------------+ DP      116             1.05 triphasicBandaging affecting angle of                                               insonation                           +--------+----------------+-----+---------+------------------------------------+  Right ABIs appear essentially unchanged compared to prior study on 01/22/2020. Left ABIs appear essentially unchanged compared to prior study on 01/22/2020.  Summary: Right: Resting right ankle-brachial index is within normal range. Left: Resting left ankle-brachial index is within normal range. *See table(s) above for measurements and observations.  Electronically signed by Gerarda Fraction on 08/03/2022 at 5:57:46 PM.    Final    DG Foot 2 Views Left  Result Date: 08/02/2022 CLINICAL DATA:  Status post first toe amputation EXAM: LEFT FOOT - 2 VIEW COMPARISON:  07/31/2022 FINDINGS: First distal phalanx has been removed in the interval. No residual soft tissue or bony abnormality is seen. Calcaneal spurring is noted. IMPRESSION: Status post first distal phalangeal removal. Electronically Signed   By: Alcide Clever M.D.   On: 08/02/2022 11:47   MR FOOT LEFT W WO CONTRAST  Result Date: 07/31/2022 CLINICAL DATA:  Right great toe infection. Wound on the left  great toe for a month. Possible osteomyelitis EXAM: MRI OF THE LEFT FOREFOOT WITHOUT AND WITH CONTRAST TECHNIQUE: Multiplanar, multisequence MR imaging of the left forefoot was performed both before and after administration of intravenous contrast. CONTRAST:  10mL GADAVIST GADOBUTROL 1 MMOL/ML IV SOLN COMPARISON:  Radiographs dated July 31, 2022 FINDINGS: Bones/Joint/Cartilage Bone marrow edema of the distal phalanx of the first digit with enhancement on post contrast sequences. Marrow signal within remaining osseous structures is within normal limits. Subchondral cystic changes at the first interphalangeal joint. Ligaments Lisfranc and collateral ligaments are intact. Muscles and Tendons Increased intrasubstance signal of the plantar muscles. No fluid collection or abscess. Soft tissues Deep skin wound about the medial aspect of the distal phalanx of the first digit with surrounding edema and inflammatory changes and diffuse enhancement. No appreciable abscess. IMPRESSION: 1. Osteomyelitis of the distal phalanx of the first digit. 2. Deep skin wound about the medial aspect of the distal phalanx of the first digit with surrounding edema and enhancement consistent with cellulitis. No appreciable abscess. 3. Increased intrasubstance signal of the plantar muscles consistent with myositis. 4. 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 Electronically Signed   By: Larose Hires D.O.   On: 07/31/2022 21:32   DG Foot 2 Views Left  Result Date: 07/31/2022 CLINICAL DATA:  Leg wound for a month. EXAM: LEFT FOOT - 2 VIEW COMPARISON:  None Available. FINDINGS: Flatfoot deformity on the lateral view. No acute fracture or dislocation. Tiny plantar calcaneal spur. Mild degenerative changes of the dorsal aspect of the midfoot. Soft tissue swelling about the great toe with some lucency along the distal tuft of the distal phalanx. Question some soft tissue gas in the lateral view. Please correlate for area of  infection and possible changes of osteomyelitis. IMPRESSION: Soft tissue swelling of the great toe with soft tissue gas. Possible erosive changes involving the distal tuft of the distal phalanx of the great toe on the AP view. Please correlate for developing osteomyelitis. Further evaluation with MRI as clinically appropriate. Flatfoot deformity.  Degenerative changes Electronically Signed   By: Karen Kays M.D.   On: 07/31/2022 18:29    Microbiology: Results for orders placed or performed during the hospital encounter of 07/31/22  Blood culture (routine x 2)     Status: None (Preliminary result)   Collection Time: 07/31/22 10:27 PM   Specimen: BLOOD  Result Value Ref Range Status   Specimen Description BLOOD RIGHT ANTECUBITAL  Final   Special Requests   Final    BOTTLES DRAWN AEROBIC AND ANAEROBIC Blood  Culture adequate volume   Culture   Final    NO GROWTH 3 DAYS Performed at [facility], [address], [place], [place] [address]    Report Status PENDING  Incomplete  Blood culture (routine x 2)     Status: None (Preliminary result)   Collection Time: 07/31/22 10:27 PM   Specimen: BLOOD RIGHT ARM  Result Value Ref Range Status   Specimen Description BLOOD RIGHT ARM  Final   Special Requests   Final    BOTTLES DRAWN AEROBIC AND ANAEROBIC Blood Culture adequate volume   Culture   Final    NO GROWTH 3 DAYS Performed at [facility], [address] [place], [place] [address]    Report Status PENDING  Incomplete  Aerobic/Anaerobic Culture w Gram Stain (surgical/deep wound)     Status: None (Preliminary result)   Collection Time: 08/02/22 10:55 AM   Specimen: Path Tissue; Wound  Result Value Ref Range Status   Specimen Description   Final    TISSUE Performed at [facility], [address], [place], [place] [address]    Special Requests   Final    NONE Performed at [facility], [address], [place], [place] [address]    Gram  Stain NO WBC SEEN NO ORGANISMS SEEN   Final   Culture   Final    NO GROWTH < 24 HOURS Performed at [facility], [address], [place], [place] [address]    Report Status PENDING  Incomplete  Aerobic/Anaerobic Culture w Gram Stain (surgical/deep wound)     Status: None (Preliminary result)   Collection Time: 08/02/22 10:57 AM   Specimen: Path Tissue  Result Value Ref Range Status   Specimen Description   Final    TISSUE Performed at [facility], [address], [place], [place] [address]    Special Requests   Final    NONE Performed at [facility], [address] [place], [place] [address]    Gram Stain NO WBC SEEN NO ORGANISMS SEEN   Final   Culture   Final    CULTURE REINCUBATED FOR BETTER GROWTH Performed at [facility], [address], [place], [place] [address]    Report Status PENDING  Incomplete    Labs: CBC: Recent Labs  Lab 07/31/22 1711 08/01/22 0354  WBC 7.4 8.4  HGB 14.0 12.8*  HCT 44.6 39.9  MCV 88.8 87.7  PLT 199 192   Basic Metabolic Panel: Recent Labs  Lab 07/31/22 1711 08/01/22 0519 08/03/22 0839  NA 140 139  --   K 4.2 3.9  --   CL 106 109  --   CO2 26 22  --   GLUCOSE 103* 94  --   BUN 15 14  --   CREATININE 1.34* 1.26* 1.47*  CALCIUM 9.2 9.1  --    Liver Function Tests: Recent Labs  Lab 07/31/22 1711  AST 19  ALT 16  ALKPHOS 50  BILITOT 0.6  PROT 7.0  ALBUMIN 3.8   CBG: No results for input(s): "GLUCAP" in the last 168 hours.  Discharge time spent: greater than 30 minutes.  Signed: Brendia Sacks, MD [facility] 08/03/2022

## 2022-08-03 NOTE — Progress Notes (Signed)
Pedro Lee to be D/C'd Home per MD order.  Discussed with the patient and all questions fully answered.  VSS, Skin clean, dry and intact without evidence of skin break down, no evidence of skin tears noted. IV catheter discontinued intact. Site without signs and symptoms of complications. Dressing and pressure applied.  An After Visit Summary was printed and given to the patient. Patient prescriptions sent to pharmacy. Patient received bariatric/tall walker for home  D/c education completed with patient/family including follow up instructions, medication list, d/c activities limitations if indicated, with other d/c instructions as indicated by MD - patient able to verbalize understanding, all questions fully answered.   Patient instructed to return to ED, call 911, or call MD for any changes in condition.   Patient escorted via WC, and D/C home via private auto.  [name] 08/03/2022 7:16 PM

## 2022-08-03 NOTE — TOC Transition Note (Signed)
Transition of Care [facility]) - CM/SW Discharge Note  Patient Details  Name: Pedro Lee MRN: [id number] Date of Birth: Dec 26, 1940  Transition of Care [facility]) CM/SW Contact:  Ewing Schlein, LCSW Phone Number: 08/03/2022, 2:36 PM  Clinical Narrative: PT evaluation recommended HHPT and a bariatric/tall walker. Patient agreeable to referrals. CSW made HHPT referral to Cindie with [facility], which was accepted. CSW made DME referral made to [facility] with [employer] for a bariatric walker, which [employer] does not carry a tall version. [employer] to deliver rolling walker to room. CSW updated patient. TOC signing off.  Final next level of care: Home w Home Health Services Barriers to Discharge: No Barriers Identified  Patient Goals and CMS Choice CMS Medicare.gov Compare Post Acute Care list provided to:: Patient Choice offered to / list presented to : Patient  Discharge Plan and Services Additional resources added to the After Visit Summary for         DME Arranged: Walker tall DME Agency: [employer] Date DME Agency Contacted: 08/03/22 Time DME Agency Contacted: [phone] Representative spoke with at DME Agency: Zack HH Arranged: PT HH Agency: [facility] Date [facility] Agency Contacted: 08/03/22 Time HH Agency Contacted: [phone] Representative spoke with at [facility] Agency: Cindie  Social Determinants of Health (SDOH) Interventions SDOH Screenings   Food Insecurity: No Food Insecurity (05/06/2020)  Housing: Patient Declined (08/01/2022)  Transportation Needs: No Transportation Needs (05/06/2020)  Alcohol Screen: Low Risk  (05/06/2020)  Depression (PHQ2-9): Low Risk  (05/06/2020)  Financial Resource Strain: Low Risk  (05/06/2020)  Physical Activity: Inactive (05/06/2020)  Stress: No Stress Concern Present (05/06/2020)  Tobacco Use: Low Risk  (08/03/2022)   Readmission Risk Interventions     No data to display

## 2022-08-03 NOTE — Care Management Important Message (Signed)
Important Message  Patient Details IM Letter given. Name: LOKI WUTHRICH MRN: [id number] Date of Birth: [birth year]   Medicare Important Message Given:  Yes     Caren Macadam 08/03/2022, 2:15 PM

## 2022-08-04 LAB — SURGICAL PATHOLOGY

## 2022-08-04 LAB — AEROBIC/ANAEROBIC CULTURE W GRAM STAIN (SURGICAL/DEEP WOUND): Gram Stain: NONE SEEN

## 2022-08-04 NOTE — Progress Notes (Deleted)
Cardiology Office Note:  .   Date:  08/04/2022  ID:  Pedro Lee, DOB May 22, 1940, MRN [id number] PCP: Pedro Bender, MD  [facility] Providers Cardiologist:  Pedro Noe, MD (Inactive) { Click to update primary MD,subspecialty MD or APP then REFRESH:1}   History of Present Illness: .   Pedro Lee is a 82 y.o. 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 He was initially seen 05/06/2020 to establish care in the hypertension clinic.  He saw Pedro Lee in 2019 for exertional dyspnea.  His symptoms were thought to be due to deconditioning, hypertension, and obesity.  He subsequently followed up with Pedro Carson, PA-C, on 10/2019.  At that time his blood pressure was running high.  Prior to that he had seen his PCP and doxazosin was reduced due to low blood pressure and exertional dyspnea.  He had a [employer] 10/2019 that was negative for ischemia.  Systolic function was normal.  He saw Pedro Lee back 11/2019 and his doxazosin was increased to 2 mg twice daily.  He had a virtual follow-up 11/2019 and his blood pressure was running high in the evenings.  He was working out and limiting his sodium intake.  They decided to increase his doxazosin because he was also struggling with his prostate.  He was referred to advanced hypertension clinic for further management.  Lately his BP has been ranging 130-170s/70-90s.  He has been mostly checking it in the mornings.  He got a new machine about 2 months ago.   Pedro Lee had his pituitary tumor removed about 5 years ago.  He does not think he follows up with endocrinology.  He lost his wife of 54 years in May 2021 and has struggled since that time.  Thyroid function and cortisol are within normal limits.  At his initial visit he reported leg discomfort.  He had ABIs 01/2020 that were within normal limits.  His symptoms were thought to be  mostly due to venous insufficiency and he was given furosemide to take as needed.  He saw our PharmD 02/2020 and hydralazine was added to his regimen.  On 03/2020 he had increased LE edema and was not using furosemide.  He was confused about his medications and carvedilol and doxazosin were increased. We discussed taking his medications more consistently at the same time each day. Hydralazine was stopped and he was started on amlodipine. He had repeat renal artery dopplers 01/27/2021, which continued to have mild to moderate blockage bilaterally. Amlodipine was stopped due to LE edema and hydralazine was increased. His blood pressures started getting too low, so doxazosin was reduced.   Today, he has been feeling good overall. 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 He also has stiffness and pain in his left neck. He is scheduled to see a neurologist concerning this. For exercise he tries to walk 2-3 times a week, and he uses a stationary bicycle for 15 minutes at a time. Generally he feels will with exercise, but he has shortness of breath while walking up inclines specifically. He considers this to have worsened since last year. At home his blood pressure has averaged in the 140s/80s. He notes it is typically for his blood pressure to fluctuate similar to in clinic today (144/80, repeat: 126/70). Currently he is taking 1/2 tablet of doxazosin twice a day. It was reduced after his  blood pressure was too low and he could feel it occurring. Usually he wears compression stockings, but he is not wearing them today. He does not use furosemide very often. Lately, he has been driving to [facility], and taking his diuretic those days would be troublesome. He denies any palpitations, or chest pain. No lightheadedness, headaches, syncope, orthopnea, or PND.   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  He underwent amputation of the great toe.  He was noted to be compensated from a  HF standpoint.    ROS: ***  Studies Reviewed: .        *** Risk Assessment/Calculations:   {Does this patient have ATRIAL FIBRILLATION?:[phone]}         Physical Exam:   VS:  There were no vitals taken for this visit.   Wt Readings from Last 3 Encounters:  07/31/22 (!) 313 lb (142 kg)  01/19/22 (!) 315 lb (142.9 kg)  10/20/21 (!) 308 lb 14.4 oz (140.1 kg)    GEN: Well nourished, well developed in no acute distress NECK: No JVD; No carotid bruits CARDIAC: ***RRR, no murmurs, rubs, gallops RESPIRATORY:  Clear to auscultation without rales, wheezing or rhonchi  ABDOMEN: Soft, non-tender, non-distended EXTREMITIES:  No edema; No deformity   ASSESSMENT AND PLAN: .   ***    {Are you ordering a CV Procedure (e.g. stress test, cath, DCCV, TEE, etc)?   Press F2        :[id number]}  Dispo: ***  Signed, Chilton Si, MD

## 2022-08-05 ENCOUNTER — Ambulatory Visit (HOSPITAL_BASED_OUTPATIENT_CLINIC_OR_DEPARTMENT_OTHER): Payer: Medicare PPO | Admitting: Cardiovascular Disease

## 2022-08-05 DIAGNOSIS — I5032 Chronic diastolic (congestive) heart failure: Secondary | ICD-10-CM

## 2022-08-05 DIAGNOSIS — N1831 Chronic kidney disease, stage 3a: Secondary | ICD-10-CM

## 2022-08-05 DIAGNOSIS — E7800 Pure hypercholesterolemia, unspecified: Secondary | ICD-10-CM

## 2022-08-05 DIAGNOSIS — I10 Essential (primary) hypertension: Secondary | ICD-10-CM

## 2022-08-05 LAB — CULTURE, BLOOD (ROUTINE X 2)
Culture: NO GROWTH
Culture: NO GROWTH
Special Requests: ADEQUATE
Special Requests: ADEQUATE

## 2022-08-05 LAB — AEROBIC/ANAEROBIC CULTURE W GRAM STAIN (SURGICAL/DEEP WOUND)

## 2022-08-06 ENCOUNTER — Telehealth: Payer: Self-pay | Admitting: *Deleted

## 2022-08-06 LAB — AEROBIC/ANAEROBIC CULTURE W GRAM STAIN (SURGICAL/DEEP WOUND)

## 2022-08-06 NOTE — Telephone Encounter (Signed)
Rehab manager w/ [facility] is calling to let the physician know that they received a referral from hospital for patient for home health care but patient wanted to delay the start date until 06/29 th.

## 2022-08-07 ENCOUNTER — Ambulatory Visit: Payer: Medicare PPO | Admitting: Podiatry

## 2022-08-07 LAB — AEROBIC/ANAEROBIC CULTURE W GRAM STAIN (SURGICAL/DEEP WOUND): Culture: NO GROWTH

## 2022-08-08 LAB — AEROBIC/ANAEROBIC CULTURE W GRAM STAIN (SURGICAL/DEEP WOUND)

## 2022-08-10 ENCOUNTER — Ambulatory Visit (INDEPENDENT_AMBULATORY_CARE_PROVIDER_SITE_OTHER): Payer: Medicare PPO | Admitting: Podiatry

## 2022-08-10 ENCOUNTER — Ambulatory Visit (INDEPENDENT_AMBULATORY_CARE_PROVIDER_SITE_OTHER): Payer: Medicare PPO

## 2022-08-10 DIAGNOSIS — M869 Osteomyelitis, unspecified: Secondary | ICD-10-CM | POA: Diagnosis not present

## 2022-08-10 DIAGNOSIS — M2042 Other hammer toe(s) (acquired), left foot: Secondary | ICD-10-CM | POA: Diagnosis not present

## 2022-08-10 DIAGNOSIS — M86072 Acute hematogenous osteomyelitis, left ankle and foot: Secondary | ICD-10-CM

## 2022-08-10 NOTE — Progress Notes (Signed)
Chief Complaint  Patient presents with   Routine Post Op    Subjective:  Patient presents today status post partial left great toe amputation performed inpatient.  DOS: 08/02/2022.  Patient doing well.  WBAT surgical shoe.  No new complaints  Past Medical History:  Diagnosis Date   Acute on chronic diastolic heart failure (HCC) 12/22/2019   Allergy    sesonal   Complication of anesthesia    HX OF ENLARGED PROSTATE AND UNABLE TO PASS WATER AFTER ANESTHESIA   Dyspnea    with some exertion   Dysrhythmia    HX ATRIAL FIBRILLATION - HAD ABLATION - NO LONGER HAS AF.   Essential hypertension 12/22/2019   Essential hypertension, malignant    Exertional dyspnea 01/27/2021   Gout, unspecified    Lower extremity edema 12/22/2019   Obesity, mild    OSA (obstructive sleep apnea)    Mild - PT STATES HE DID NOT HAVE TO USE CPAP   Osteoarthritis    Osteoarthrosis, unspecified whether generalized or localized, unspecified site    Other dyspnea and respiratory abnormality    Prostate enlargement    DR. NESI IS PT'S UROLOGIST   Pure hypercholesterolemia    Pure hypercholesterolemia 01/27/2021   Renal insufficiency, mild    PT STATES NOT AWARE OF ANY KIDNEY PROBLEM    Past Surgical History:  Procedure Laterality Date   AMPUTATION TOE Left 08/02/2022   Procedure: LEFT GREAT TOE AMPUTATION;  Surgeon: Vivi Barrack, DPM;  Location: [facility];  Service: Podiatry;  Laterality: Left;   COLONOSCOPY     CRANIOTOMY N/A 03/24/2016   Procedure: Transphenoidal Resection of Tumor;  Surgeon: Julio Sicks, MD;  Location: [facility] OR;  Service: Neurosurgery;  Laterality: N/A;  Transphenoidal Resection of Tumor   HEART ABLATION FOR AF  1998   JOINT REPLACEMENT     LEFT TOTAL KNEE REPLACEMENT   KNEE ARTHROPLASTY Left 2013ish   LEFT KNEE ARTHROSCOPY     TOTAL KNEE ARTHROPLASTY Right 05/30/2013   Procedure: RIGHT TOTAL KNEE ARTHROPLASTY;  Surgeon: Shelda Pal, MD;  Location: [facility];  Service: Orthopedics;   Laterality: Right;   TRANSNASAL APPROACH N/A 03/24/2016   Procedure: TRANSNASAL APPROACH;  Surgeon: Drema Halon, MD;  Location: [facility] OR;  Service: ENT;  Laterality: N/A;  TRANSNASAL APPROACH    Allergies  Allergen Reactions   Penicillins Rash    Objective/Physical Exam Neurovascular status intact.  Incision well coapted with sutures intact. No sign of infectious process noted. No dehiscence. No active bleeding noted.  Moderate edema noted bilateral.  Moderate hammertoe noted to the second digit of the left foot  Radiographic Exam LT foot 08/10/2022:  Interval amputation of the left great toe at the level of the IPJ.  Normal osseous mineralization.  No gas within the tissue.  No evidence of residual osteomyelitis.  Assessment: 1. s/p partial left great toe amputation. DOS: 08/02/2022 2.  Hammertoe second digit left  Plan of Care:  -Patient was evaluated. X-rays reviewed - Recommend Betadine to the amputation site daily -Continue WBAT surgical shoe -Silicone toe crest pads were dispensed today to offload pressure from the distal tip of the left second toe -Return to clinic 2 weeks suture removal   Felecia Shelling, DPM [facility]  Dr. Felecia Shelling, DPM    [address]  [place], [place] [address]                Office [phone]  Fax [phone]

## 2022-08-10 NOTE — Telephone Encounter (Signed)
Spoke with the home health nurse, went out on the 29 th to access and patient,wanted to let the physician know that he  is walking and getting around ok and does not require their services.

## 2022-08-11 ENCOUNTER — Other Ambulatory Visit: Payer: Self-pay | Admitting: Podiatry

## 2022-08-11 MED ORDER — CIPROFLOXACIN HCL 500 MG PO TABS
500.0000 mg | ORAL_TABLET | Freq: Two times a day (BID) | ORAL | 0 refills | Status: DC
Start: 2022-08-11 — End: 2022-08-25

## 2022-08-24 ENCOUNTER — Ambulatory Visit (INDEPENDENT_AMBULATORY_CARE_PROVIDER_SITE_OTHER): Payer: Medicare PPO | Admitting: Podiatry

## 2022-08-24 DIAGNOSIS — M2042 Other hammer toe(s) (acquired), left foot: Secondary | ICD-10-CM | POA: Diagnosis not present

## 2022-08-24 DIAGNOSIS — M869 Osteomyelitis, unspecified: Secondary | ICD-10-CM | POA: Diagnosis not present

## 2022-08-24 NOTE — Progress Notes (Signed)
   Chief Complaint  Patient presents with   Routine Post Op     POV # 2 partial left great toe amputation  DOS: 08/02/2022., patient denies any pain, NO N/V/F/C/SOB, suture      Subjective:  Patient presents today status post partial left great toe amputation performed inpatient.  DOS: 08/02/2022.  Patient doing well.  WBAT surgical shoe.  No fevers chills.  Concerned about continued pressure on the left second toe.  No drainage of pus or any open lesion.  No other concerns.   Objective/Physical Exam General: AAO x3, NAD  Dermatological: Status post hallux hypertension which was to be healing well.  He is starting to get a preulcerative lesion of the distal aspect of the left second toe without any ulceration at this time there is no tissue loss.  No edema no erythema or signs of infection.  No open lesions otherwise.     Vascular: Dorsalis Pedis artery and Posterior Tibial artery pedal pulses are palpable bilateral with immedate capillary fill time. There is no pain with calf compression, swelling, warmth, erythema.   Neruologic: Sensation decreased.  Musculoskeletal: Hammertoes present  Assessment: 1. s/p partial left great toe amputation. DOS: 08/02/2022 2.  Hammertoe second digit left  Plan of Care:  -Patient is a hospice feeling well.  Continue daily dressing changes, offloading.  He is getting more pressure to the left second toe.  Discussed flexor tenotomy if needed.  In the meantime he can continue with toe crest, offloading.  If symptoms persist or worsen likely proceed with a flexor tenotomy.  Manage surgical shoe for now. -Monitor for any clinical signs or symptoms of infection and directed to call the office immediately should any occur or go to the ER.  Vivi Barrack DPM

## 2022-08-25 ENCOUNTER — Encounter (HOSPITAL_BASED_OUTPATIENT_CLINIC_OR_DEPARTMENT_OTHER): Payer: Self-pay | Admitting: Family

## 2022-08-25 ENCOUNTER — Other Ambulatory Visit (HOSPITAL_COMMUNITY): Payer: Self-pay

## 2022-08-25 ENCOUNTER — Other Ambulatory Visit (HOSPITAL_BASED_OUTPATIENT_CLINIC_OR_DEPARTMENT_OTHER): Payer: Self-pay

## 2022-08-25 ENCOUNTER — Telehealth (HOSPITAL_BASED_OUTPATIENT_CLINIC_OR_DEPARTMENT_OTHER): Payer: Self-pay

## 2022-08-25 ENCOUNTER — Ambulatory Visit (HOSPITAL_BASED_OUTPATIENT_CLINIC_OR_DEPARTMENT_OTHER): Payer: Medicare PPO | Admitting: Family

## 2022-08-25 VITALS — BP 124/68 | HR 76 | Ht 77.0 in | Wt 316.7 lb

## 2022-08-25 DIAGNOSIS — Z79899 Other long term (current) drug therapy: Secondary | ICD-10-CM

## 2022-08-25 DIAGNOSIS — I4892 Unspecified atrial flutter: Secondary | ICD-10-CM | POA: Diagnosis not present

## 2022-08-25 DIAGNOSIS — Z6839 Body mass index (BMI) 39.0-39.9, adult: Secondary | ICD-10-CM

## 2022-08-25 DIAGNOSIS — I5032 Chronic diastolic (congestive) heart failure: Secondary | ICD-10-CM

## 2022-08-25 DIAGNOSIS — D6859 Other primary thrombophilia: Secondary | ICD-10-CM

## 2022-08-25 DIAGNOSIS — I10 Essential (primary) hypertension: Secondary | ICD-10-CM | POA: Diagnosis not present

## 2022-08-25 DIAGNOSIS — E782 Mixed hyperlipidemia: Secondary | ICD-10-CM

## 2022-08-25 DIAGNOSIS — I701 Atherosclerosis of renal artery: Secondary | ICD-10-CM

## 2022-08-25 MED ORDER — APIXABAN 5 MG PO TABS: 5.0000 mg | ORAL_TABLET | Freq: Two times a day (BID) | ORAL | 11 refills | Status: DC

## 2022-08-25 MED ORDER — APIXABAN 5 MG PO TABS: 5.0000 mg | ORAL_TABLET | Freq: Two times a day (BID) | ORAL | 0 refills | Status: DC

## 2022-08-25 MED ORDER — CARVEDILOL 12.5 MG PO TABS: 18.7500 mg | ORAL_TABLET | Freq: Two times a day (BID) | ORAL | 3 refills | Status: AC

## 2022-08-25 MED ORDER — APIXABAN 5 MG PO TABS: 5.0000 mg | ORAL_TABLET | Freq: Two times a day (BID) | ORAL | Status: DC

## 2022-08-25 NOTE — Progress Notes (Signed)
Cardiology Office Note:  .   Date:  08/25/2022  ID:  [name], DOB [birth year], MRN [id number] PCP: Gwenyth Bender, MD  [facility] Providers Cardiologist:  Chilton Si, MD    History of Present Illness: .   Pedro Lee is a 82 y.o. 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 Katrinka Blazing in 2019 for exertional dyspnea thought to be due to deconditioning, hypertension, obesity.   Lexiscan Myoview 10/2019 negative for ischemia with normal LVEF.  Due to elevated BP doxazosin dose previously increased and he was referred to advanced hypertension clinic.   He lost his wife of 55 years May 2021 and has had understandable difficulty since that time.  ABIs 01/2020 in the setting of leg pain within normal limits.  Symptoms thought to be due to venous insufficiency and provided as needed furosemide.  02/2020 and hydralazine added for hypertension.  Repeat renal artery Dopplers 01/27/2021 with mild to moderate blockage bilaterally.  Amlodipine stopped due to lower extremity edema and hydralazine increased.  Doxazosin reduced due to hypotension. AAA duplex 01/2021 with no evidence of abdominal aortic aneurysm (largest measurement 2.9cm).   Seen in clinic 01/27/2021 Lasix increased to 40 mg daily for 3 days. Spoke with pharmacist 05/22/2021 with BP 112/62.  He was no longer taking doxazosin.  He was recommended to reduce his carvedilol to half tablet.  Evening doxazosin was resumed as BP elevated in the morning after 148/85.   Seen 08/2021 with repeat echo performed 09/02/21 EF 65-70%, no RWMA, moderate LVH, gr1DD, RV moderately enlarged, mildly elevated PASP, mild dilation ascending aorta 41mm. [name] initiated however had to later be discontinued due to UTI.   Last seen 12 06/17/2021 with weight up 17 pounds and  increased lower extremity edema as he noted he is not taking his Lasix and some dietary indiscretion with travel.  He was recommended to increase Lasix to 40 mg daily for 3 days then return to as needed.  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  Presents today for follow-up independently. Weight up one pound since clinic visit 6 months ago.  He notes bilateral ankle pain R>L presently wearing brace on the right ankle. She saw orthopedics who offered surgery vs brace. He opted for brace but does not feel it is making much of a difference. He notes exertional dyspnea with even small amount of activity. For example, yesterday went to the doctor and a few errands and was exhausted. Notes this has been worse over the past couple months. No orthopnea, PND. Notes some palpitations with sensation of heart racing when he is exhausted. Has not been exercising due to his ankle pain. Bilateral ankle swelling and also occasional leg swelling. He is wearing compression stockings. He is taking Lasix daily. BP at home routinely 120s/60s.   ROS: Please see the history of present illness.    All other systems reviewed and are negative.   Studies Reviewed: Marland Kitchen   EKG Interpretation Date/Time:  Tuesday August 25 2022 10:12:59 EDT Ventricular Rate:  76 PR Interval:    QRS Duration:  112 QT Interval:  370 QTC Calculation: 416 R Axis:   51  Text Interpretation: Atrial flutter with variable A-V block New onset atrial flutter since last EKG. No  acute St/T wave changes. Confirmed by Gillian Shields ([id number]) on 08/25/2022 10:17:41 AM    Cardiac Studies & Procedures     STRESS TESTS  MYOCARDIAL PERFUSION IMAGING 11/07/2019  Narrative  Nuclear stress EF: 60%.  The left ventricular ejection fraction is normal (55-65%).  There was no ST segment deviation noted during stress.  The study is normal.   ECHOCARDIOGRAM  ECHOCARDIOGRAM COMPLETE  09/02/2021  Narrative ECHOCARDIOGRAM REPORT    Patient Name:   Pedro Lee Date of Exam: 09/02/2021 Medical Rec #:  [id number]      Height:       77.0 in Accession #:    [id number]     Weight:       313.6 lb Date of Birth:  July 14, 1940       BSA:          2.708 m Patient Age:    81 years       BP:           118/68 mmHg Patient Gender: M              HR:           62 bpm. Exam Location:  [address]  Procedure: 2D Echo, 3D Echo, Cardiac Doppler and Color Doppler  Indications:    R06.00 Dyspnea  History:        Patient has prior history of Echocardiogram examinations, most recent 11/07/2019. CHF, Arrythmias:Atrial Fibrillation, Signs/Symptoms:Edema; Risk Factors:Family History of Coronary Artery Disease, Hypertension, Dyslipidemia and Sleep Apnea. Obesity.  Sonographer:    Farrel Conners RDCS Referring Phys: Alver Sorrow  IMPRESSIONS   1. Left ventricular ejection fraction, by estimation, is 65 to 70%. The left ventricle has normal function. The left ventricle has no regional wall motion abnormalities. There is moderate concentric left ventricular hypertrophy. Left ventricular diastolic parameters are consistent with Grade I diastolic dysfunction (impaired relaxation). 2. Right ventricular systolic function is normal. The right ventricular size is moderately enlarged. There is mildly elevated pulmonary artery systolic pressure. The estimated right ventricular systolic pressure is 40.5 mmHg. 3. Left atrial size was mildly dilated. 4. Right atrial size was mildly dilated. 5. The mitral valve is normal in structure. Trivial mitral valve regurgitation. No evidence of mitral stenosis. 6. The aortic valve is tricuspid. Aortic valve regurgitation is not visualized. 7. Aortic dilatation noted. There is mild dilatation of the ascending aorta, measuring 41 mm.  Comparison(s): Prior images reviewed side by side. Increase in aortic size.  FINDINGS Left Ventricle: Left ventricular  ejection fraction, by estimation, is 65 to 70%. The left ventricle has normal function. The left ventricle has no regional wall motion abnormalities. The left ventricular internal cavity size was normal in size. There is moderate concentric left ventricular hypertrophy. Left ventricular diastolic parameters are consistent with Grade I diastolic dysfunction (impaired relaxation).  Right Ventricle: The right ventricular size is moderately enlarged. No increase in right ventricular wall thickness. Right ventricular systolic function is normal. There is mildly elevated pulmonary artery systolic pressure. The tricuspid regurgitant velocity is 2.85 m/s, and with an assumed right atrial pressure of 8 mmHg, the estimated right ventricular systolic pressure is 40.5 mmHg.  Left Atrium: Left atrial size was mildly dilated.  Right Atrium: Right atrial size was mildly dilated.  Pericardium: Trivial pericardial effusion is present.  Mitral Valve: The mitral valve is normal in structure. Trivial mitral valve regurgitation. No evidence of mitral valve stenosis.  Tricuspid Valve: The tricuspid valve is normal in structure. Tricuspid  valve regurgitation is mild . No evidence of tricuspid stenosis.  Aortic Valve: The aortic valve is tricuspid. Aortic valve regurgitation is not visualized. Aortic regurgitation PHT measures 274 msec.  Pulmonic Valve: The pulmonic valve was normal in structure. Pulmonic valve regurgitation is mild. No evidence of pulmonic stenosis.  Aorta: Aortic dilatation noted. There is mild dilatation of the ascending aorta, measuring 41 mm.  IAS/Shunts: No atrial level shunt detected by color flow Doppler.   LEFT VENTRICLE PLAX 2D LVIDd:         5.30 cm   Diastology LVIDs:         3.00 cm   LV e' medial:    6.53 cm/s LV PW:         1.20 cm   LV E/e' medial:  12.7 LV IVS:        1.30 cm   LV e' lateral:   11.90 cm/s LVOT diam:     2.20 cm   LV E/e' lateral: 7.0 LV SV:         106 LV  SV Index:   39 LVOT Area:     3.80 cm  3D Volume EF: 3D EF:        69 % LV EDV:       172 ml LV ESV:       54 ml LV SV:        118 ml  RIGHT VENTRICLE RV Basal diam:  4.20 cm RV Mid diam:    4.50 cm RV S prime:     15.30 cm/s TAPSE (M-mode): 2.7 cm  LEFT ATRIUM              Index        RIGHT ATRIUM           Index LA diam:        4.60 cm  1.70 cm/m   RA Area:     25.20 cm LA Vol (A2C):   105.0 ml 38.77 ml/m  RA Volume:   80.50 ml  29.73 ml/m LA Vol (A4C):   87.9 ml  32.46 ml/m LA Biplane Vol: 96.2 ml  35.52 ml/m AORTIC VALVE LVOT Vmax:   126.00 cm/s LVOT Vmean:  80.050 cm/s LVOT VTI:    0.279 m AI PHT:      274 msec  AORTA Ao Root diam: 3.80 cm Ao Asc diam:  3.95 cm  MITRAL VALVE               TRICUSPID VALVE MV Area (PHT): cm         TR Peak grad:   32.5 mmHg MV Decel Time: 222 msec    TR Vmax:        285.00 cm/s MV E velocity: 83.05 cm/s MV A velocity: 86.75 cm/s  SHUNTS MV E/A ratio:  0.96        Systemic VTI:  0.28 m Systemic Diam: 2.20 cm  Riley Lam MD Electronically signed by Riley Lam MD Signature Date/Time: 09/02/2021/1:39:22 PM    Final             Risk Assessment/Calculations:     CHA2DS2-VASc Score = 5   This indicates a 7.2% annual risk of stroke. The patient's score is based upon: CHF History: 1 HTN History: 1 Diabetes History: 0 Stroke History: 0 Vascular Disease History: 1 (renal artery stenosis) Age Score: 2 Gender Score: 0             Physical Exam:  VS:  BP 124/68 (BP Location: Left Arm, Patient Position: Sitting, Cuff Size: Large)   Pulse 76   Ht 6\' 5"  (1.956 m)   Wt (!) 316 lb 11.2 oz (143.7 kg)   BMI 37.56 kg/m    Wt Readings from Last 3 Encounters:  08/25/22 (!) 316 lb 11.2 oz (143.7 kg)  07/31/22 (!) 313 lb (142 kg)  01/19/22 (!) 315 lb (142.9 kg)    GEN: Well nourished, overweight,  well developed in no acute distress NECK: No JVD; No carotid bruits CARDIAC: irregularly irregular,  no  murmurs, rubs, gallops RESPIRATORY:  Clear to auscultation without rales, wheezing or rhonchi  ABDOMEN: Soft, non-tender, non-distended EXTREMITIES: No deformity; Bilateral 2+ pitting edema  ASSESSMENT AND PLAN: .    Atrial flutter -new onset today.  Rate controlled 76 bpm.  Notes worsening dyspnea, exercise intolerance, palpitations.  Increase carvedilol from 12.5 mg to 18.75 mg twice daily.  Start Eliquis 5 mg twice daily.  Does not meet dose reduction criteria. CHA2DS2-VASc Score = 5 [CHF History: 1, HTN History: 1, Diabetes History: 0, Stroke History: 0, Vascular Disease History: 1 (renal artery stenosis), Age Score: 2, Gender Score: 0].  Therefore, the patient's annual risk of stroke is 7.2 %.    Update echo to rule out valvular etiology. Update CMP, CBC, mag, thyroid panel to ensure no anemia, electrolyte or thyroid abnormality causing atrial flutter. Plan for plan follow-up in 2 weeks to discuss possible cardioversion at that time if he has not returned to NSR.  HTN -BP controlled.  Continue hydralazine 25 mg twice daily, amlodipine 5 mg at night.  Increase Carvedilol for atrial flutter, as above. Discussed to monitor BP at home at least 2 hours after medications and sitting for 5-10 minutes.    Obesity - Weight loss via diet and exercise encouraged. Discussed the impact being overweight would have on cardiovascular risk.  Not presently exercising.  Offered referral to outpatient physical therapy for deconditioning which he will consider.  Exercise tolerance limited by bilateral ankle pain for which he is following with orthopedics.   Renal artery stenosis - 01/2021 bilateral 1-59% stenosis.  01/2022 duplex L renal artery 1-59%. Repeat duplex already ordered for 01/2023  Chronic diastolic heart failure -NYHA III. Likely exacerbated by atrial flutter.  Did not tolerate Farxiga due to UTI.Marland KitchenContinue GDMT carvedilol, Hydralazine,  Lasix. Increase Lasix to BID x 3 days then return to every day. CMP  today.Low sodium diet, fluid restriction <2L, and daily weights encouraged. Educated to contact our office for weight gain of 2 lbs overnight or 5 lbs in one week. Hesitant to utilize MRA due to high normal potassium.   Hyperlipidemia -continue rosuvastatin 20 mg daily. 04/15/21 LDL 68. Consider lipid panel at follow up, not addressed at this clinic visit.        Dispo: follow up in 2 weeks  Signed, Alver Sorrow, NP

## 2022-08-25 NOTE — Telephone Encounter (Signed)
Per test claim no PA required. Pharmacy filled today.

## 2022-08-25 NOTE — Telephone Encounter (Signed)
No PA required, patient notified via VM (ok per DPR)

## 2022-08-25 NOTE — Patient Instructions (Addendum)
Medication Instructions:  Your physician has recommended you make the following change in your medication:   Stop: Aspirin   Start: Eliquis 5mg  twice daily   Increase: Carvedilol 1.5 tablets twice daily   Increase lasix twice daily for 3 days then return to daily   *If you need a refill on your cardiac medications before your next appointment, please call your pharmacy*   Lab Work: Your physician recommends that you return for lab work today- Cmp, CBC, Mag, Thyroid Panel  If you have labs (blood work) drawn today and your tests are completely normal, you will receive your results only by: MyChart Message (if you have MyChart) OR A paper copy in the mail If you have any lab test that is abnormal or we need to change your treatment, we will call you to review the results.   Testing/Procedures: Please schedule your Echocardiogram    Follow-Up: At [facility], you and your health needs are our priority.  As part of our continuing mission to provide you with exceptional heart care, we have created designated Provider Care Teams.  These Care Teams include your primary Cardiologist (physician) and Advanced Practice Providers (APPs -  Physician Assistants and Nurse Practitioners) who all work together to provide you with the care you need, when you need it.  We recommend signing up for the patient portal called "MyChart".  Sign up information is provided on this After Visit Summary.  MyChart is used to connect with patients for Virtual Visits (Telemedicine).  Patients are able to view lab/test results, encounter notes, upcoming appointments, etc.  Non-urgent messages can be sent to your provider as well.   To learn more about what you can do with MyChart, go to [email or web address].    Your next appointment:   2 weeks with Gillian Shields, NP   Other Instructions Atrial Flutter  Atrial flutter is a type of abnormal heart rhythm (arrhythmia). The heart has an electrical  system that tells it how to beat. In atrial flutter, the signals move rapidly in the top chambers of the heart (the atria). This makes your heart beat very fast. Atrial flutter can come and go, or it can be permanent. The goal of treatment is to prevent blood clots from forming, control your heart rate, or get your heartbeat back into a normal rhythm. If this condition is not treated, it can cause serious problems, such as a weakened heart muscle (cardiomyopathy) or a stroke. What are the causes? This condition is often caused by conditions that damage the heart's electrical system. These include: Heart conditions and heart surgery. These include heart attacks and open-heart surgery. Lung problems, such as COPD or a blood clot in the lung (pulmonary embolism, or PE). Poorly controlled high blood pressure (hypertension). Diabetes. Overactive thyroid (hyperthyroidism). Taking medicines to treat other heart rhythm problems (antiarrhythmic medicines). Obstructive sleep apnea. In some cases, the cause of this condition is not known. What increases the risk? This condition is more likely to develop in people who: Are older adults. Are overweight (obese). Have a family history of atrial flutter. Use alcohol or drugs, including cannabis. Smoke. What are the signs or symptoms? Symptoms of this condition include: A feeling that your heart is pounding or racing (palpitations). Shortness of breath. Chest pain. Feeling dizzy or light-headed. Fainting. Low blood pressure (hypotension). Feeling tired (fatigue). Tiring easily during exercise or activity. In some cases, there are no symptoms. How is this diagnosed? This condition may be diagnosed with: An electrocardiogram (ECG)  to check the electrical signals of your heart. An ambulatory cardiac monitor to record your heart's activity for a few days. An echocardiogram. This test uses ultrasound imaging to create pictures of your heart. A  transesophageal echocardiogram (TEE). This uses a flexible tube passed down your esophagus to create better pictures of your heart. A stress test to check your heart's blood supply while you exercise. Imaging tests, such as a CT scan or chest X-ray. Blood tests. How is this treated? Treatment depends on underlying conditions and how you feel when you have atrial flutter. This condition may be treated with: Medicines to prevent blood clots or to treat heart rate or heart rhythm problems. Electrical cardioversion. This uses a jolt of electricity to reset the heart's rhythm. A procedure to remove the heart tissue that sends abnormal signals (ablation). Left atrial appendage closure. This is a procedure to seal off a small area of the heart where blood clots can form. In some cases, underlying conditions will be treated. Follow these instructions at home: Medicines Take over-the-counter and prescription medicines only as told by your health care provider. Do not take any new medicines without talking to your provider. If you are taking blood thinners: Talk with your provider before taking aspirin or NSAIDs. These medicines can raise your risk of bleeding. Take your medicines as told. Take them at the same time each day. Do not do things that could hurt or bruise you. Be careful to avoid falls. Wear an alert bracelet or carry a card that says you take blood thinners. Lifestyle Eat heart-healthy foods. Talk with a dietitian to make an eating plan that is right for you. Do not use any products that contain nicotine or tobacco. These products include cigarettes, chewing tobacco, and vaping devices, such as e-cigarettes. If you need help quitting, ask your provider. Do not drink alcohol. Do not use drugs, including cannabis. Lose weight if you are overweight or obese. Exercise regularly as told by your provider. General instructions Do not use diet pills unless your provider approves. Diet pills  may make heart problems worse. If you have obstructive sleep apnea, manage your condition as told. Keep all follow-up visits for continued treatment and for your safety. Contact a health care provider if: You notice a change in the rate, rhythm, or strength of your heartbeat. You are taking a blood thinner and you have more bruising. You have a sudden change in weight. You tire more easily when you exercise or do heavy work. Get help right away if you have: Pain or pressure in your chest. Shortness of breath. Fainting. Increasing sweating with no known cause. Side effects of blood thinners, such as blood in your vomit, stool, or pee (urine), or bleeding that you cannot stop. Any symptoms of a stroke. "BE FAST" is an easy way to remember the main warning signs of a stroke: B - Balance. Signs are dizziness, sudden trouble walking, or loss of balance. E - Eyes. Signs are trouble seeing or a sudden change in vision. F - Face. Signs are sudden weakness or numbness of the face, or the face or eyelid drooping on one side. A - Arms. Signs are weakness or numbness in an arm. This happens suddenly and usually on one side of the body. S - Speech. Signs are sudden trouble speaking, slurred speech, or trouble understanding what people say. T - Time. Time to call emergency services. Write down what time symptoms started. Other signs of a stroke, such as: A  sudden, severe headache with no known cause. Nausea or vomiting. Seizure. These symptoms may be an emergency. Get help right away. Call 911. Do not wait to see if the symptoms will go away. Do not drive yourself to the hospital. This information is not intended to replace advice given to you by your health care provider. Make sure you discuss any questions you have with your health care provider. Document Revised: 09/30/2021 Document Reviewed: 09/30/2021 Elsevier Patient Education  2024 [employer].

## 2022-08-25 NOTE — Telephone Encounter (Signed)
Patient started on Eliquis 5mg  twice daily today in clinic for Atrial Flutter, will likely need prior auth.

## 2022-09-08 ENCOUNTER — Ambulatory Visit (HOSPITAL_BASED_OUTPATIENT_CLINIC_OR_DEPARTMENT_OTHER): Payer: Medicare PPO | Admitting: Family

## 2022-09-08 ENCOUNTER — Ambulatory Visit (INDEPENDENT_AMBULATORY_CARE_PROVIDER_SITE_OTHER): Payer: Medicare PPO | Admitting: Podiatry

## 2022-09-08 ENCOUNTER — Encounter (HOSPITAL_BASED_OUTPATIENT_CLINIC_OR_DEPARTMENT_OTHER): Payer: Self-pay | Admitting: Family

## 2022-09-08 VITALS — BP 118/64 | HR 72 | Ht 77.0 in | Wt 310.0 lb

## 2022-09-08 DIAGNOSIS — M2042 Other hammer toe(s) (acquired), left foot: Secondary | ICD-10-CM

## 2022-09-08 DIAGNOSIS — I4892 Unspecified atrial flutter: Secondary | ICD-10-CM

## 2022-09-08 DIAGNOSIS — M869 Osteomyelitis, unspecified: Secondary | ICD-10-CM | POA: Diagnosis not present

## 2022-09-08 DIAGNOSIS — I701 Atherosclerosis of renal artery: Secondary | ICD-10-CM

## 2022-09-08 DIAGNOSIS — D6859 Other primary thrombophilia: Secondary | ICD-10-CM

## 2022-09-08 DIAGNOSIS — I10 Essential (primary) hypertension: Secondary | ICD-10-CM | POA: Diagnosis not present

## 2022-09-08 DIAGNOSIS — I5032 Chronic diastolic (congestive) heart failure: Secondary | ICD-10-CM

## 2022-09-08 DIAGNOSIS — Z6839 Body mass index (BMI) 39.0-39.9, adult: Secondary | ICD-10-CM

## 2022-09-08 NOTE — Patient Instructions (Signed)
Medication Instructions:  Your physician recommends that you continue on your current medications as directed. Please refer to the Current Medication list given to you today.  *If you need a refill on your cardiac medications before your next appointment, please call your pharmacy*   Lab Work: Labs today   Testing/Procedures: Your physician has requested that you have an echocardiogram. Echocardiography is a painless test that uses sound waves to create images of your heart. It provides your doctor with information about the size and shape of your heart and how well your heart's chambers and valves are working. This procedure takes approximately one hour. There are no restrictions for this procedure. Please do NOT wear cologne, perfume, aftershave, or lotions (deodorant is allowed). Please arrive 15 minutes prior to your appointment time.  Follow-Up: At [facility], you and your health needs are our priority.  As part of our continuing mission to provide you with exceptional heart care, we have created designated Provider Care Teams.  These Care Teams include your primary Cardiologist (physician) and Advanced Practice Providers (APPs -  Physician Assistants and Nurse Practitioners) who all work together to provide you with the care you need, when you need it.  We recommend signing up for the patient portal called "MyChart".  Sign up information is provided on this After Visit Summary.  MyChart is used to connect with patients for Virtual Visits (Telemedicine).  Patients are able to view lab/test results, encounter notes, upcoming appointments, etc.  Non-urgent messages can be sent to your provider as well.   To learn more about what you can do with MyChart, go to [email or web address].    Your next appointment:   Follow up 1-2 weeks after echo with Dr. Duke Salvia or Gillian Shields, NP

## 2022-09-08 NOTE — H&P (View-Only) (Signed)
 Cardiology Office Note:  .   Date:  09/13/2022  ID:  Pedro Lee, DOB 1940-05-02, MRN [id number] PCP: Gwenyth Bender, MD  [facility] Providers Cardiologist:  Chilton Si, MD    History of Present Illness: .   Pedro Lee is a 82 y.o. 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   Prior [employer] 10/2019 negative for ischemia with normal LVEF.  Due to elevated BP doxazosin dose previously increased and he was referred to advanced hypertension clinic.   He lost his wife of 54 years May 2021 and has had understandable difficulty since that time.  ABIs 01/2020 due to leg pain within normal limits.  Symptoms thought to be due to venous insufficiency and provided as needed furosemide.  At visit 02/2020 hydralazine added for hypertension.  Repeat renal Dopplers 01/27/2021 with mild to moderate blockage bilaterally.  Amlodipine stopped due to lower extremity edema and hydralazine increased.  Doxazosin reduced due to hypotension. AAA duplex 01/2021 with no evidence of abdominal aortic aneurysm (largest measurement 2.9cm).   Spoke with pharmacist 05/22/2021 with BP 112/62.  He was no longer taking doxazosin.  He was recommended to reduce his carvedilol to half tablet.  Evening doxazosin was resumed as BP elevated in the morning after 148/85.   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 [name] initiated however had to later be discontinued due to UTI.   Seen 12 06/17/2021 with weight up 17 pounds in setting of not taking Lasix and dietary indiscretion, Lasix increased  to 40 mg daily for 3 days then return to as needed.  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  Clinic visit 08/25/22 new onset rate controlled atrial flutter. CBC, mag, thyroid panel, CMP unremarkable. Eliquis initiated. Carvedilol increased to 1.5 tablets BID.   Presents today for follow up. Upcoming trip to [place] to see family. Notes exertional dyspnea with hills, some fatigue. No palpitations, chest pain. Weight down 6 lbs from last clinic visit. Tolerating Eliquis without issue. Discussed cardioversion, he wishes to complete scheduled echocardiogram prior to cardioversion.   ROS: Please see the history of present illness.    All other systems reviewed and are negative.   Studies Reviewed: Marland Kitchen   EKG Interpretation Date/Time:  Tuesday September 08 2022 14:53:42 EDT Ventricular Rate:  72 PR Interval:  196 QRS Duration:  112 QT Interval:  344 QTC Calculation: 376 R Axis:   44  Text Interpretation: Rate controlled atrial flutter. Confirmed by Gillian Shields ([id number]) on 09/08/2022 4:45:00 PM    Cardiac Studies & Procedures     STRESS TESTS  MYOCARDIAL PERFUSION IMAGING 11/07/2019  Narrative  Nuclear stress EF: 60%.  The left ventricular ejection fraction is normal (55-65%).  There was no ST segment deviation noted during stress.  The study is normal.   ECHOCARDIOGRAM  ECHOCARDIOGRAM COMPLETE 09/02/2021  Narrative ECHOCARDIOGRAM REPORT    Patient Name:   Pedro Lee Date of Exam: 09/02/2021 Medical Rec #:  [id number]      Height:       77.0 in Accession #:    [id number]     Weight:       313.6 lb Date of Birth:  Dec 29, 1940  BSA:          2.708 m Patient Age:    81 years       BP:           118/68 mmHg Patient Gender: M              HR:           62 bpm. Exam Location:  [address]  Procedure: 2D Echo, 3D Echo, Cardiac Doppler and Color Doppler  Indications:    R06.00 Dyspnea  History:        Patient has prior history of Echocardiogram examinations, most recent 11/07/2019. CHF, Arrythmias:Atrial Fibrillation, Signs/Symptoms:Edema; Risk  Factors:Family History of Coronary Artery Disease, Hypertension, Dyslipidemia and Sleep Apnea. Obesity.  Sonographer:    Farrel Conners RDCS Referring Phys: Alver Sorrow  IMPRESSIONS   1. Left ventricular ejection fraction, by estimation, is 65 to 70%. The left ventricle has normal function. The left ventricle has no regional wall motion abnormalities. There is moderate concentric left ventricular hypertrophy. Left ventricular diastolic parameters are consistent with Grade I diastolic dysfunction (impaired relaxation). 2. Right ventricular systolic function is normal. The right ventricular size is moderately enlarged. There is mildly elevated pulmonary artery systolic pressure. The estimated right ventricular systolic pressure is 40.5 mmHg. 3. Left atrial size was mildly dilated. 4. Right atrial size was mildly dilated. 5. The mitral valve is normal in structure. Trivial mitral valve regurgitation. No evidence of mitral stenosis. 6. The aortic valve is tricuspid. Aortic valve regurgitation is not visualized. 7. Aortic dilatation noted. There is mild dilatation of the ascending aorta, measuring 41 mm.  Comparison(s): Prior images reviewed side by side. Increase in aortic size.  FINDINGS Left Ventricle: Left ventricular ejection fraction, by estimation, is 65 to 70%. The left ventricle has normal function. The left ventricle has no regional wall motion abnormalities. The left ventricular internal cavity size was normal in size. There is moderate concentric left ventricular hypertrophy. Left ventricular diastolic parameters are consistent with Grade I diastolic dysfunction (impaired relaxation).  Right Ventricle: The right ventricular size is moderately enlarged. No increase in right ventricular wall thickness. Right ventricular systolic function is normal. There is mildly elevated pulmonary artery systolic pressure. The tricuspid regurgitant velocity is 2.85 m/s, and with an assumed  right atrial pressure of 8 mmHg, the estimated right ventricular systolic pressure is 40.5 mmHg.  Left Atrium: Left atrial size was mildly dilated.  Right Atrium: Right atrial size was mildly dilated.  Pericardium: Trivial pericardial effusion is present.  Mitral Valve: The mitral valve is normal in structure. Trivial mitral valve regurgitation. No evidence of mitral valve stenosis.  Tricuspid Valve: The tricuspid valve is normal in structure. Tricuspid valve regurgitation is mild . No evidence of tricuspid stenosis.  Aortic Valve: The aortic valve is tricuspid. Aortic valve regurgitation is not visualized. Aortic regurgitation PHT measures 274 msec.  Pulmonic Valve: The pulmonic valve was normal in structure. Pulmonic valve regurgitation is mild. No evidence of pulmonic stenosis.  Aorta: Aortic dilatation noted. There is mild dilatation of the ascending aorta, measuring 41 mm.  IAS/Shunts: No atrial level shunt detected by color flow Doppler.   LEFT VENTRICLE PLAX 2D LVIDd:         5.30 cm   Diastology LVIDs:         3.00 cm   LV e' medial:    6.53 cm/s LV PW:         1.20 cm   LV E/e' medial:  12.7 LV IVS:        1.30 cm   LV e' lateral:   11.90 cm/s LVOT diam:     2.20 cm   LV E/e' lateral: 7.0 LV SV:         106 LV SV Index:   39 LVOT Area:     3.80 cm  3D Volume EF: 3D EF:        69 % LV EDV:       172 ml LV ESV:       54 ml LV SV:        118 ml  RIGHT VENTRICLE RV Basal diam:  4.20 cm RV Mid diam:    4.50 cm RV S prime:     15.30 cm/s TAPSE (M-mode): 2.7 cm  LEFT ATRIUM              Index        RIGHT ATRIUM           Index LA diam:        4.60 cm  1.70 cm/m   RA Area:     25.20 cm LA Vol (A2C):   105.0 ml 38.77 ml/m  RA Volume:   80.50 ml  29.73 ml/m LA Vol (A4C):   87.9 ml  32.46 ml/m LA Biplane Vol: 96.2 ml  35.52 ml/m AORTIC VALVE LVOT Vmax:   126.00 cm/s LVOT Vmean:  80.050 cm/s LVOT VTI:    0.279 m AI PHT:      274 msec  AORTA Ao Root diam:  3.80 cm Ao Asc diam:  3.95 cm  MITRAL VALVE               TRICUSPID VALVE MV Area (PHT): cm         TR Peak grad:   32.5 mmHg MV Decel Time: 222 msec    TR Vmax:        285.00 cm/s MV E velocity: 83.05 cm/s MV A velocity: 86.75 cm/s  SHUNTS MV E/A ratio:  0.96        Systemic VTI:  0.28 m Systemic Diam: 2.20 cm  Riley Lam MD Electronically signed by Riley Lam MD Signature Date/Time: 09/02/2021/1:39:22 PM    Final             Risk Assessment/Calculations:     CHA2DS2-VASc Score = 5   This indicates a 7.2% annual risk of stroke. The patient's score is based upon: CHF History: 1 HTN History: 1 Diabetes History: 0 Stroke History: 0 Vascular Disease History: 1 (renal artery stenosis) Age Score: 2 Gender Score: 0            Physical Exam:   VS:  BP 118/64   Pulse 72   Ht 6\' 5"  (1.956 m)   Wt (!) 310 lb (140.6 kg)   BMI 36.76 kg/m    Wt Readings from Last 3 Encounters:  09/08/22 (!) 310 lb (140.6 kg)  08/25/22 (!) 316 lb 11.2 oz (143.7 kg)  07/31/22 (!) 313 lb (142 kg)    GEN: Well nourished, overweight,  well developed in no acute distress NECK: No JVD; No carotid bruits CARDIAC: irregularly irregular,  no murmurs, rubs, gallops RESPIRATORY:  Clear to auscultation without rales, wheezing or rhonchi  ABDOMEN: Soft, non-tender, non-distended EXTREMITIES: No deformity; Bilateral 2+ pitting edema  ASSESSMENT AND PLAN: .    Atrial flutter -EKG today rate controlled atrial flutter. Continue  carvedilol 18.75 mg twice daily., Eliquis 5 mg twice  daily.  Does not meet dose reduction criteria. CHA2DS2-VASc Score = 5 [CHF History: 1, HTN History: 1, Diabetes History: 0, Stroke History: 0, Vascular Disease History: 1 (renal artery stenosis), Age Score: 2, Gender Score: 0].  Therefore, the patient's annual risk of stroke is 7.2 %.    Update echo upcoming to rule out valvular etiology. We discussed cardioversion as he is symptomatic with exertional dyspnea,  exercise intolerance. He prefers to proceed with echo prior.   HTN -BP controlled.  Continue hydralazine 25 mg twice daily, amlodipine 5 mg at night, Carvedilol 18.75mg  BID.    Obesity - Weight loss via diet and exercise encouraged. Discussed the impact being overweight would have on cardiovascular risk.  Not presently exercising.  Previously offered referral to outpatient physical therapy for deconditioning which he will consider.     Renal artery stenosis - 01/2021 bilateral 1-59% stenosis.  01/2022 duplex L renal artery 1-59%. Repeat duplex already ordered for 01/2023  Chronic diastolic heart failure -NYHA II-III. Likely exacerbated by atrial flutter.  Did not tolerate Farxiga due to UTI. Continue GDMT carvedilol, Hydralazine,  Lasix. Low sodium diet, fluid restriction <2L, and daily weights encouraged. Educated to contact our office for weight gain of 2 lbs overnight or 5 lbs in one week.    Hyperlipidemia -continue rosuvastatin 20 mg daily. 04/15/21 LDL 68. Consider lipid panel at follow up, not addressed at this clinic visit.        Dispo: follow up after echo to discuss cardioversion  Signed, Alver Sorrow, NP

## 2022-09-08 NOTE — Progress Notes (Signed)
Cardiology Office Note:  .   Date:  09/13/2022  ID:  Pedro Lee, DOB 1940-05-02, MRN [id number] PCP: Gwenyth Bender, MD  [facility] Providers Cardiologist:  Chilton Si, MD    History of Present Illness: .   Pedro Lee is a 82 y.o. 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   Prior [employer] 10/2019 negative for ischemia with normal LVEF.  Due to elevated BP doxazosin dose previously increased and he was referred to advanced hypertension clinic.   He lost his wife of 54 years May 2021 and has had understandable difficulty since that time.  ABIs 01/2020 due to leg pain within normal limits.  Symptoms thought to be due to venous insufficiency and provided as needed furosemide.  At visit 02/2020 hydralazine added for hypertension.  Repeat renal Dopplers 01/27/2021 with mild to moderate blockage bilaterally.  Amlodipine stopped due to lower extremity edema and hydralazine increased.  Doxazosin reduced due to hypotension. AAA duplex 01/2021 with no evidence of abdominal aortic aneurysm (largest measurement 2.9cm).   Spoke with pharmacist 05/22/2021 with BP 112/62.  He was no longer taking doxazosin.  He was recommended to reduce his carvedilol to half tablet.  Evening doxazosin was resumed as BP elevated in the morning after 148/85.   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 [name] initiated however had to later be discontinued due to UTI.   Seen 12 06/17/2021 with weight up 17 pounds in setting of not taking Lasix and dietary indiscretion, Lasix increased  to 40 mg daily for 3 days then return to as needed.  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  Clinic visit 08/25/22 new onset rate controlled atrial flutter. CBC, mag, thyroid panel, CMP unremarkable. Eliquis initiated. Carvedilol increased to 1.5 tablets BID.   Presents today for follow up. Upcoming trip to [place] to see family. Notes exertional dyspnea with hills, some fatigue. No palpitations, chest pain. Weight down 6 lbs from last clinic visit. Tolerating Eliquis without issue. Discussed cardioversion, he wishes to complete scheduled echocardiogram prior to cardioversion.   ROS: Please see the history of present illness.    All other systems reviewed and are negative.   Studies Reviewed: Marland Kitchen   EKG Interpretation Date/Time:  Tuesday September 08 2022 14:53:42 EDT Ventricular Rate:  72 PR Interval:  196 QRS Duration:  112 QT Interval:  344 QTC Calculation: 376 R Axis:   44  Text Interpretation: Rate controlled atrial flutter. Confirmed by Gillian Shields ([id number]) on 09/08/2022 4:45:00 PM    Cardiac Studies & Procedures     STRESS TESTS  MYOCARDIAL PERFUSION IMAGING 11/07/2019  Narrative  Nuclear stress EF: 60%.  The left ventricular ejection fraction is normal (55-65%).  There was no ST segment deviation noted during stress.  The study is normal.   ECHOCARDIOGRAM  ECHOCARDIOGRAM COMPLETE 09/02/2021  Narrative ECHOCARDIOGRAM REPORT    Patient Name:   Pedro Lee Date of Exam: 09/02/2021 Medical Rec #:  [id number]      Height:       77.0 in Accession #:    [id number]     Weight:       313.6 lb Date of Birth:  Dec 29, 1940  BSA:          2.708 m Patient Age:    81 years       BP:           118/68 mmHg Patient Gender: M              HR:           62 bpm. Exam Location:  [address]  Procedure: 2D Echo, 3D Echo, Cardiac Doppler and Color Doppler  Indications:    R06.00 Dyspnea  History:        Patient has prior history of Echocardiogram examinations, most recent 11/07/2019. CHF, Arrythmias:Atrial Fibrillation, Signs/Symptoms:Edema; Risk  Factors:Family History of Coronary Artery Disease, Hypertension, Dyslipidemia and Sleep Apnea. Obesity.  Sonographer:    Farrel Conners RDCS Referring Phys: Alver Sorrow  IMPRESSIONS   1. Left ventricular ejection fraction, by estimation, is 65 to 70%. The left ventricle has normal function. The left ventricle has no regional wall motion abnormalities. There is moderate concentric left ventricular hypertrophy. Left ventricular diastolic parameters are consistent with Grade I diastolic dysfunction (impaired relaxation). 2. Right ventricular systolic function is normal. The right ventricular size is moderately enlarged. There is mildly elevated pulmonary artery systolic pressure. The estimated right ventricular systolic pressure is 40.5 mmHg. 3. Left atrial size was mildly dilated. 4. Right atrial size was mildly dilated. 5. The mitral valve is normal in structure. Trivial mitral valve regurgitation. No evidence of mitral stenosis. 6. The aortic valve is tricuspid. Aortic valve regurgitation is not visualized. 7. Aortic dilatation noted. There is mild dilatation of the ascending aorta, measuring 41 mm.  Comparison(s): Prior images reviewed side by side. Increase in aortic size.  FINDINGS Left Ventricle: Left ventricular ejection fraction, by estimation, is 65 to 70%. The left ventricle has normal function. The left ventricle has no regional wall motion abnormalities. The left ventricular internal cavity size was normal in size. There is moderate concentric left ventricular hypertrophy. Left ventricular diastolic parameters are consistent with Grade I diastolic dysfunction (impaired relaxation).  Right Ventricle: The right ventricular size is moderately enlarged. No increase in right ventricular wall thickness. Right ventricular systolic function is normal. There is mildly elevated pulmonary artery systolic pressure. The tricuspid regurgitant velocity is 2.85 m/s, and with an assumed  right atrial pressure of 8 mmHg, the estimated right ventricular systolic pressure is 40.5 mmHg.  Left Atrium: Left atrial size was mildly dilated.  Right Atrium: Right atrial size was mildly dilated.  Pericardium: Trivial pericardial effusion is present.  Mitral Valve: The mitral valve is normal in structure. Trivial mitral valve regurgitation. No evidence of mitral valve stenosis.  Tricuspid Valve: The tricuspid valve is normal in structure. Tricuspid valve regurgitation is mild . No evidence of tricuspid stenosis.  Aortic Valve: The aortic valve is tricuspid. Aortic valve regurgitation is not visualized. Aortic regurgitation PHT measures 274 msec.  Pulmonic Valve: The pulmonic valve was normal in structure. Pulmonic valve regurgitation is mild. No evidence of pulmonic stenosis.  Aorta: Aortic dilatation noted. There is mild dilatation of the ascending aorta, measuring 41 mm.  IAS/Shunts: No atrial level shunt detected by color flow Doppler.   LEFT VENTRICLE PLAX 2D LVIDd:         5.30 cm   Diastology LVIDs:         3.00 cm   LV e' medial:    6.53 cm/s LV PW:         1.20 cm   LV E/e' medial:  12.7 LV IVS:        1.30 cm   LV e' lateral:   11.90 cm/s LVOT diam:     2.20 cm   LV E/e' lateral: 7.0 LV SV:         106 LV SV Index:   39 LVOT Area:     3.80 cm  3D Volume EF: 3D EF:        69 % LV EDV:       172 ml LV ESV:       54 ml LV SV:        118 ml  RIGHT VENTRICLE RV Basal diam:  4.20 cm RV Mid diam:    4.50 cm RV S prime:     15.30 cm/s TAPSE (M-mode): 2.7 cm  LEFT ATRIUM              Index        RIGHT ATRIUM           Index LA diam:        4.60 cm  1.70 cm/m   RA Area:     25.20 cm LA Vol (A2C):   105.0 ml 38.77 ml/m  RA Volume:   80.50 ml  29.73 ml/m LA Vol (A4C):   87.9 ml  32.46 ml/m LA Biplane Vol: 96.2 ml  35.52 ml/m AORTIC VALVE LVOT Vmax:   126.00 cm/s LVOT Vmean:  80.050 cm/s LVOT VTI:    0.279 m AI PHT:      274 msec  AORTA Ao Root diam:  3.80 cm Ao Asc diam:  3.95 cm  MITRAL VALVE               TRICUSPID VALVE MV Area (PHT): cm         TR Peak grad:   32.5 mmHg MV Decel Time: 222 msec    TR Vmax:        285.00 cm/s MV E velocity: 83.05 cm/s MV A velocity: 86.75 cm/s  SHUNTS MV E/A ratio:  0.96        Systemic VTI:  0.28 m Systemic Diam: 2.20 cm  Riley Lam MD Electronically signed by Riley Lam MD Signature Date/Time: 09/02/2021/1:39:22 PM    Final             Risk Assessment/Calculations:     CHA2DS2-VASc Score = 5   This indicates a 7.2% annual risk of stroke. The patient's score is based upon: CHF History: 1 HTN History: 1 Diabetes History: 0 Stroke History: 0 Vascular Disease History: 1 (renal artery stenosis) Age Score: 2 Gender Score: 0            Physical Exam:   VS:  BP 118/64   Pulse 72   Ht 6\' 5"  (1.956 m)   Wt (!) 310 lb (140.6 kg)   BMI 36.76 kg/m    Wt Readings from Last 3 Encounters:  09/08/22 (!) 310 lb (140.6 kg)  08/25/22 (!) 316 lb 11.2 oz (143.7 kg)  07/31/22 (!) 313 lb (142 kg)    GEN: Well nourished, overweight,  well developed in no acute distress NECK: No JVD; No carotid bruits CARDIAC: irregularly irregular,  no murmurs, rubs, gallops RESPIRATORY:  Clear to auscultation without rales, wheezing or rhonchi  ABDOMEN: Soft, non-tender, non-distended EXTREMITIES: No deformity; Bilateral 2+ pitting edema  ASSESSMENT AND PLAN: .    Atrial flutter -EKG today rate controlled atrial flutter. Continue  carvedilol 18.75 mg twice daily., Eliquis 5 mg twice  daily.  Does not meet dose reduction criteria. CHA2DS2-VASc Score = 5 [CHF History: 1, HTN History: 1, Diabetes History: 0, Stroke History: 0, Vascular Disease History: 1 (renal artery stenosis), Age Score: 2, Gender Score: 0].  Therefore, the patient's annual risk of stroke is 7.2 %.    Update echo upcoming to rule out valvular etiology. We discussed cardioversion as he is symptomatic with exertional dyspnea,  exercise intolerance. He prefers to proceed with echo prior.   HTN -BP controlled.  Continue hydralazine 25 mg twice daily, amlodipine 5 mg at night, Carvedilol 18.75mg  BID.    Obesity - Weight loss via diet and exercise encouraged. Discussed the impact being overweight would have on cardiovascular risk.  Not presently exercising.  Previously offered referral to outpatient physical therapy for deconditioning which he will consider.     Renal artery stenosis - 01/2021 bilateral 1-59% stenosis.  01/2022 duplex L renal artery 1-59%. Repeat duplex already ordered for 01/2023  Chronic diastolic heart failure -NYHA II-III. Likely exacerbated by atrial flutter.  Did not tolerate Farxiga due to UTI. Continue GDMT carvedilol, Hydralazine,  Lasix. Low sodium diet, fluid restriction <2L, and daily weights encouraged. Educated to contact our office for weight gain of 2 lbs overnight or 5 lbs in one week.    Hyperlipidemia -continue rosuvastatin 20 mg daily. 04/15/21 LDL 68. Consider lipid panel at follow up, not addressed at this clinic visit.        Dispo: follow up after echo to discuss cardioversion  Signed, Alver Sorrow, NP

## 2022-09-13 ENCOUNTER — Encounter (HOSPITAL_BASED_OUTPATIENT_CLINIC_OR_DEPARTMENT_OTHER): Payer: Self-pay | Admitting: Family

## 2022-09-13 NOTE — Progress Notes (Signed)
  Chief Complaint  Patient presents with   Callouses    Pt is here for pre-ulcerative callus and follow up from toe amputation. Pt states he is not having any pain. Pt had xray's done on 08/10/22    Subjective:  Patient presents today status post partial left great toe amputation performed inpatient.  DOS: 08/02/2022.  Patient doing well.  WBAT surgical shoe.  Presents today for follow evaluation of lesion on the second toe.  Does not see any drainage or pus and things are getting better.  He is using toe crest.  No other concerns today.  Objective/Physical Exam General: AAO x3, NAD  Dermatological: Status post hallux hypertension which was to be healing well, scar has formed.  At the distal aspect of second toe preulcerative lesion noted but there is no skin breakdown.  There is no change approximate signs of infection.  Overall does appear to be getting better.  No edema, erythema.  Vascular: Dorsalis Pedis artery and Posterior Tibial artery pedal pulses are palpable bilateral with immedate capillary fill time. There is no pain with calf compression, swelling, warmth, erythema.   Neruologic: Sensation decreased.  Musculoskeletal: Hammertoes present  Assessment: 1. s/p partial left great toe amputation. DOS: 08/02/2022 2.  Hammertoe second digit left  Plan of Care:  -Amputation site is healing well.  More concern for the second toe but this seems to be getting better as well.  We discussed flexor tenotomy but since there is improvement with conservative treatment we will continue with this.  Will continue toe crest.  If needed will do proximal tenotomy which we can discuss. -Monitor for any clinical signs or symptoms of infection and directed to call the office immediately should any occur or go to the ER.  Return in about 2 weeks (around 09/22/2022) for pre-ulcerative callus 2nd toe, possible flexor tenotomy .  Vivi Barrack DPM

## 2022-09-18 ENCOUNTER — Ambulatory Visit (INDEPENDENT_AMBULATORY_CARE_PROVIDER_SITE_OTHER): Payer: Medicare PPO

## 2022-09-18 DIAGNOSIS — I5032 Chronic diastolic (congestive) heart failure: Secondary | ICD-10-CM

## 2022-09-20 ENCOUNTER — Other Ambulatory Visit (HOSPITAL_BASED_OUTPATIENT_CLINIC_OR_DEPARTMENT_OTHER): Payer: Self-pay | Admitting: Family

## 2022-09-20 DIAGNOSIS — I714 Abdominal aortic aneurysm, without rupture, unspecified: Secondary | ICD-10-CM

## 2022-09-21 ENCOUNTER — Telehealth (HOSPITAL_BASED_OUTPATIENT_CLINIC_OR_DEPARTMENT_OTHER): Payer: Self-pay | Admitting: Family

## 2022-09-21 ENCOUNTER — Ambulatory Visit (HOSPITAL_BASED_OUTPATIENT_CLINIC_OR_DEPARTMENT_OTHER): Payer: Medicare PPO | Admitting: Family

## 2022-09-21 ENCOUNTER — Encounter (HOSPITAL_BASED_OUTPATIENT_CLINIC_OR_DEPARTMENT_OTHER): Payer: Self-pay | Admitting: Family

## 2022-09-21 ENCOUNTER — Telehealth (HOSPITAL_BASED_OUTPATIENT_CLINIC_OR_DEPARTMENT_OTHER): Payer: Self-pay

## 2022-09-21 VITALS — BP 122/78 | HR 71 | Ht 77.0 in | Wt 318.0 lb

## 2022-09-21 DIAGNOSIS — I5032 Chronic diastolic (congestive) heart failure: Secondary | ICD-10-CM

## 2022-09-21 DIAGNOSIS — I10 Essential (primary) hypertension: Secondary | ICD-10-CM

## 2022-09-21 DIAGNOSIS — Z6839 Body mass index (BMI) 39.0-39.9, adult: Secondary | ICD-10-CM

## 2022-09-21 DIAGNOSIS — E782 Mixed hyperlipidemia: Secondary | ICD-10-CM

## 2022-09-21 DIAGNOSIS — D6859 Other primary thrombophilia: Secondary | ICD-10-CM | POA: Diagnosis not present

## 2022-09-21 DIAGNOSIS — I4892 Unspecified atrial flutter: Secondary | ICD-10-CM | POA: Diagnosis not present

## 2022-09-21 DIAGNOSIS — I701 Atherosclerosis of renal artery: Secondary | ICD-10-CM

## 2022-09-21 NOTE — Telephone Encounter (Signed)
Spoke with patient regarding the scheduled CTA Abd/Pelvis w/wo contrast on Tuesday 10/06/22 at 10:00 am at [facility]---arrival time is 9:45 am for check in at the ground floor imaging department.Patient voiced his understanding

## 2022-09-21 NOTE — Progress Notes (Signed)
Cardiology Office Note:  .   Date:  09/21/2022  ID:  Pedro Lee, DOB 04-12-1940, MRN [id number] PCP: Gwenyth Bender, MD  [facility] Providers Cardiologist:  Chilton Si, MD    History of Present Illness: .   Pedro Lee is a 82 y.o. 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   Prior [employer] 10/2019 negative for ischemia with normal LVEF.  Due to elevated BP doxazosin dose previously increased and he was referred to advanced hypertension clinic.   He lost his wife of 80 years May 2021 and has had understandable difficulty since that time.  ABIs 01/2020 due to leg pain within normal limits.  Symptoms thought to be due to venous insufficiency and provided as needed furosemide.  At visit 02/2020 hydralazine added for hypertension.  Repeat renal Dopplers 01/27/2021 with mild to moderate blockage bilaterally.  Amlodipine stopped due to lower extremity edema and hydralazine increased.  Doxazosin reduced due to hypotension. AAA duplex 01/2021 with no evidence of abdominal aortic aneurysm (largest measurement 2.9cm).   Spoke with pharmacist 05/22/2021 with BP 112/62.  He was no longer taking doxazosin.  He was recommended to reduce his carvedilol to half tablet.  Evening doxazosin was resumed as BP elevated in the morning after 148/85.   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 [name] initiated however had to later be discontinued due to UTI.   Seen 12 06/17/2021 with weight up 17 pounds in setting of not taking Lasix and dietary indiscretion, Lasix increased  to 40 mg daily for 3 days then return to as needed.  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  Clinic visit 08/25/22 new onset rate controlled atrial flutter. CBC, mag, thyroid panel, CMP unremarkable. Eliquis initiated. Carvedilol increased to 1.5 tablets BID.   Seen 09/08/22 in rate controlled atrial flutter. Wished to complete echo prior to DCCV. Echo 09/18/22 LVEF 55-60%, moderate LVH RVSF low normal, bilateral atria moderately dilated, mild MR, abdominal aorta 4.8cm. He was set up for CTA abd for further evaluation of AAA.  Presents today for follow up. Upcoming trip to [place] to see family. Notes exertional dyspnea with hills, fatigue. No palpitations, chest pain. Weight up 8 lbs from last clinic visit with LE edema. Tolerating Eliquis without issue, no missed doses. Wishes to pursue DCCV.  ROS: Please see the history of present illness.    All other systems reviewed and are negative.   Studies Reviewed: .        Cardiac Studies & Procedures     STRESS TESTS  MYOCARDIAL PERFUSION IMAGING 11/07/2019  Narrative  Nuclear stress EF: 60%.  The left ventricular ejection fraction is normal (55-65%).  There was no ST segment deviation noted during stress.  The study is normal.   ECHOCARDIOGRAM  ECHOCARDIOGRAM COMPLETE 09/18/2022  Narrative ECHOCARDIOGRAM REPORT    Patient Name:   Pedro Lee Date of Exam: 09/18/2022 Medical Rec #:  [id number]      Height:       77.0 in Accession #:    [id number]     Weight:       310.0 lb Date of Birth:  October 30, 1940       BSA:  2.695 m Patient Age:    82 years       BP:           118/64 mmHg Patient Gender: M              HR:           80 bpm. Exam Location:  Outpatient  Procedure: 3D Echo, 2D Echo, Color Doppler and Cardiac Doppler  Indications:    R06.9 DOE; R60.0 Lower extremity edema; I48.2 Chronic atrial fibrillation  History:        Patient has prior history of Echocardiogram examinations, most recent 09/02/2021. CHF, Arrythmias:Atrial Fibrillation, Signs/Symptoms:Dyspnea and Edema; Risk  Factors:Hypertension, Dyslipidemia, Sleep Apnea and Family History of Coronary Artery Disease. Patient denies chest pain. He does have DOE and leg edema.  Sonographer:    Carlos American RVT, RDCS (AE), RDMS Referring Phys: [phone]  S   IMPRESSIONS   1. Left ventricular ejection fraction, by estimation, is 55 to 60%. The left ventricle has no regional wall motion abnormalities. There is moderate concentric left ventricular hypertrophy. Left ventricular diastolic parameters are indeterminate. 2. Right ventricular systolic function is low normal. The right ventricular size is mildly enlarged. 3. Left atrial size was moderately dilated. 4. Right atrial size was moderately dilated. 5. Mild mitral valve regurgitation. 6. Pulmonic valve regurgitation is moderate. 7. Adominial aorta measures 4.8 cm Recommend dedicated evaluation of aorta.  Comparison(s): EF 65%, moderate LVH, RVSP 40.5 mmHg, asc aor 41mm.  FINDINGS Left Ventricle: Left ventricular ejection fraction, by estimation, is 55 to 60%. The left ventricle has normal function. The left ventricle has no regional wall motion abnormalities. The left ventricular internal cavity size was normal in size. There is moderate concentric left ventricular hypertrophy. Left ventricular diastolic parameters are indeterminate.  Right Ventricle: The right ventricular size is mildly enlarged. Right vetricular wall thickness was not assessed. Right ventricular systolic function is low normal.  Left Atrium: Left atrial size was moderately dilated.  Right Atrium: Right atrial size was moderately dilated.  Pericardium: There is no evidence of pericardial effusion.  Mitral Valve: There is mild thickening of the mitral valve leaflet(s). Mild mitral annular calcification. Mild mitral valve regurgitation.  Tricuspid Valve: The tricuspid valve is normal in structure. Tricuspid valve regurgitation is mild.  Aortic Valve: The aortic valve is  tricuspid. Aortic valve regurgitation is not visualized. Aortic valve sclerosis/calcification is present, without any evidence of aortic stenosis. Aortic valve mean gradient measures 5.0 mmHg. Aortic valve peak gradient measures 8.9 mmHg. Aortic valve area, by VTI measures 4.08 cm.  Pulmonic Valve: The pulmonic valve was normal in structure. Pulmonic valve regurgitation is moderate.  Aorta: Adominial aorta measures 4.8 cm Recommend dedicated evaluation of aorta. The aortic root and ascending aorta are structurally normal, with no evidence of dilitation.  IAS/Shunts: No atrial level shunt detected by color flow Doppler.   LEFT VENTRICLE PLAX 2D LVIDd:         5.34 cm   Diastology LVIDs:         3.78 cm   LV e' medial:    12.00 cm/s LV PW:         1.45 cm   LV E/e' medial:  10.3 LV IVS:        1.52 cm   LV e' lateral:   9.15 cm/s LVOT diam:     2.60 cm   LV E/e' lateral: 13.6 LV SV:         97 LV SV Index:  36 LVOT Area:     5.31 cm  3D Volume EF: 3D EF:        65 % LV EDV:       132 ml LV ESV:       46 ml LV SV:        86 ml  RIGHT VENTRICLE RV S prime:     13.60 cm/s TAPSE (M-mode): 2.2 cm  LEFT ATRIUM              Index        RIGHT ATRIUM           Index LA diam:        5.10 cm  1.89 cm/m   RA Area:     29.00 cm LA Vol (A2C):   123.0 ml 45.64 ml/m  RA Volume:   109.00 ml 40.45 ml/m LA Vol (A4C):   124.0 ml 46.02 ml/m LA Biplane Vol: 126.0 ml 46.76 ml/m AORTIC VALVE                     PULMONIC VALVE AV Area (Vmax):    3.85 cm      PV Vmax:          0.93 m/s AV Area (Vmean):   3.61 cm      PV Peak grad:     3.4 mmHg AV Area (VTI):     4.08 cm      PR End Diast Vel: 7.73 msec AV Vmax:           149.00 cm/s AV Vmean:          100.000 cm/s AV VTI:            0.238 m AV Peak Grad:      8.9 mmHg AV Mean Grad:      5.0 mmHg LVOT Vmax:         108.00 cm/s LVOT Vmean:        68.000 cm/s LVOT VTI:          0.183 m LVOT/AV VTI ratio: 0.77  AORTA Ao Root diam:  3.75 cm Ao Asc diam:  3.60 cm Ao Arch diam: 4.2 cm  MITRAL VALVE                TRICUSPID VALVE MV Area (PHT): 3.68 cm     TR Peak grad:   35.5 mmHg MV Decel Time: 206 msec     TR Vmax:        298.00 cm/s MV E velocity: 124.00 cm/s MV A velocity: 79.10 cm/s   SHUNTS MV E/A ratio:  1.57         Systemic VTI:  0.18 m Systemic Diam: 2.60 cm  Dietrich Pates MD Electronically signed by Dietrich Pates MD Signature Date/Time: 09/20/2022/10:27:56 AM    Final             Risk Assessment/Calculations:     CHA2DS2-VASc Score = 5   This indicates a 7.2% annual risk of stroke. The patient's score is based upon: CHF History: 1 HTN History: 1 Diabetes History: 0 Stroke History: 0 Vascular Disease History: 1 (renal artery stenosis) Age Score: 2 Gender Score: 0            Physical Exam:   VS:  BP 122/78   Pulse 71   Ht 6\' 5"  (1.956 m)   Wt (!) 318 lb (144.2 kg)   BMI 37.71 kg/m    Wt Readings from Last  3 Encounters:  09/21/22 (!) 318 lb (144.2 kg)  09/08/22 (!) 310 lb (140.6 kg)  08/25/22 (!) 316 lb 11.2 oz (143.7 kg)    GEN: Well nourished, overweight,  well developed in no acute distress NECK: No JVD; No carotid bruits CARDIAC: irregularly irregular,  no murmurs, rubs, gallops RESPIRATORY:  Clear to auscultation without rales, wheezing or rhonchi  ABDOMEN: Soft, non-tender, non-distended EXTREMITIES: No deformity; Bilateral 2+ pitting edema  ASSESSMENT AND PLAN: .    Atrial flutter -E Continue  carvedilol 18.75 mg twice daily., Eliquis 5 mg twice daily.  Does not meet dose reduction criteria. CHA2DS2-VASc Score = 5 [CHF History: 1, HTN History: 1, Diabetes History: 0, Stroke History: 0, Vascular Disease History: 1 (renal artery stenosis), Age Score: 2, Gender Score: 0].  Therefore, the patient's annual risk of stroke is 7.2 %.    Plan for cardioversion.   Informed Consent   Shared Decision Making/Informed Consent The risks (stroke, cardiac arrhythmias rarely resulting in the  need for a temporary or permanent pacemaker, skin irritation or burns and complications associated with conscious sedation including aspiration, arrhythmia, respiratory failure and death), benefits (restoration of normal sinus rhythm) and alternatives of a direct current cardioversion were explained in detail to Mr. Constantinides and he agrees to proceed.       HTN -BP controlled.  Continue hydralazine 25 mg twice daily, amlodipine 5 mg at night, Carvedilol 18.75mg  BID.   AAA - 4.8 cm by echo. CTA abd upcoming for evaluation. No known family history. Continue beta blocker. Avoid fluoroquinolones.    Obesity - Weight loss via diet and exercise encouraged. Discussed the impact being overweight would have on cardiovascular risk.  Not presently exercising.  Previously offered referral to outpatient physical therapy for deconditioning which he will consider.     Renal artery stenosis - 01/2021 bilateral 1-59% stenosis.  01/2022 duplex L renal artery 1-59%. Repeat duplex already ordered for 01/2023  Chronic diastolic heart failure -NYHA II-III. Likely exacerbated by atrial flutter.  Did not tolerate Farxiga due to UTI. Continue GDMT carvedilol, Hydralazine,  Lasix. Low sodium diet, fluid restriction <2L, and daily weights encouraged. Educated to contact our office for weight gain of 2 lbs overnight or 5 lbs in one week. Recommended to take his PRN Lasix for next 2-3 days as weight up 8 pounds.   Hyperlipidemia -continue rosuvastatin 20 mg daily.     Dispo: follow up 2-3 weeks after cardioversion  Signed, Alver Sorrow, NP

## 2022-09-21 NOTE — Telephone Encounter (Addendum)
Results called to patient who verbalizes understanding!      ----- Message from Pedro Lee sent at 09/20/2022  6:21 PM EDT ----- Echo with normal heart pumping function. Moderate thickening of the heart muscle. Bilateral atria dilated. Mild leaking of mitral valve.   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 Recommend CTA of abdomen for further evaluation.    - I placed the CTA order, he just needs to be informed and to schedule

## 2022-09-21 NOTE — Patient Instructions (Signed)
Medication Instructions:  Your physician recommends that you continue on your current medications as directed. Please refer to the Current Medication list given to you today.  Take Lasix for two- three days and then back to as needed  *If you need a refill on your cardiac medications before your next appointment, please call your pharmacy*   Lab Work: Your physician recommends that you return for lab work today BMP & CBC   If you have labs (blood work) drawn today and your tests are completely normal, you will receive your results only by: MyChart Message (if you have MyChart) OR A paper copy in the mail If you have any lab test that is abnormal or we need to change your treatment, we will call you to review the results.   Testing/Procedures: You are scheduled for a Cardioversion on Tuesday, August 27 with Dr. Duke Salvia.  Please arrive at the [facility] (Main Entrance A) at [facility]: [address] [place], [place] [address] at 9:30 AM (This time is 1 hour(s) before your procedure to ensure your preparation). Free valet parking service is available. You will check in at ADMITTING. The support person will be asked to wait in the waiting room.  It is OK to have someone drop you off and come back when you are ready to be discharged.     DIET:  Nothing to eat or drink after midnight except a sip of water with medications (see medication instructions below)  MEDICATION INSTRUCTIONS: !!IF ANY NEW MEDICATIONS ARE STARTED AFTER TODAY, PLEASE NOTIFY YOUR PROVIDER AS SOON AS POSSIBLE!!  FYI: Medications such as Semaglutide (Ozempic, [place]), Tirzepatide (Mounjaro, Zepbound), Dulaglutide (Trulicity), etc ("GLP1 agonists") AND Canagliflozin (Invokana), Dapagliflozin (Farxiga), Empagliflozin (Jardiance), Ertugliflozin (Steglatro), Bexagliflozin [employer]) or any combination with one of these drugs such as Invokamet (Canagliflozin/Metformin), Synjardy (Empagliflozin/Metformin), etc ("SGLT2  inhibitors") must be held around the time of a procedure. This is not a comprehensive list of all of these drugs. Please review all of your medications and talk to your provider if you take any one of these. If you are not sure, ask your provider. You will need to continue this after your procedure until you are told by your provider that it is safe to stop.    LABS:  FYI:  For your safety, and to allow [place] to monitor your vital signs accurately during the surgery/procedure we request: If you have artificial nails, gel coating, SNS etc, please have those removed prior to your surgery/procedure. Not having the nail coverings /polish removed may result in cancellation or delay of your surgery/procedure.  You must have a responsible person to drive you home and stay in the waiting area during your procedure. Failure to do so could result in cancellation.  Bring your insurance cards.  *Special Note: Every effort is made to have your procedure done on time. Occasionally there are emergencies that occur at the hospital that may cause delays. Please be patient if a delay does occur.   Follow-Up: At [facility], you and your health needs are our priority.  As part of our continuing mission to provide you with exceptional heart care, we have created designated Provider Care Teams.  These Care Teams include your primary Cardiologist (physician) and Advanced Practice Providers (APPs -  Physician Assistants and Nurse Practitioners) who all work together to provide you with the care you need, when you need it.  We recommend signing up for the patient portal called "MyChart".  Sign up information  is provided on this After Visit Summary.  MyChart is used to connect with patients for Virtual Visits (Telemedicine).  Patients are able to view lab/test results, encounter notes, upcoming appointments, etc.  Non-urgent messages can be sent to your provider as well.   To learn more about what you can do with  MyChart, go to [email or web address].    Your next appointment:   2-3 weeks post cardioversion with Gillian Shields, NP

## 2022-09-22 ENCOUNTER — Ambulatory Visit (INDEPENDENT_AMBULATORY_CARE_PROVIDER_SITE_OTHER): Payer: Medicare PPO

## 2022-09-22 ENCOUNTER — Telehealth (HOSPITAL_BASED_OUTPATIENT_CLINIC_OR_DEPARTMENT_OTHER): Payer: Self-pay

## 2022-09-22 ENCOUNTER — Ambulatory Visit: Payer: Medicare PPO | Admitting: Podiatry

## 2022-09-22 DIAGNOSIS — L97522 Non-pressure chronic ulcer of other part of left foot with fat layer exposed: Secondary | ICD-10-CM

## 2022-09-22 DIAGNOSIS — M2042 Other hammer toe(s) (acquired), left foot: Secondary | ICD-10-CM | POA: Diagnosis not present

## 2022-09-22 DIAGNOSIS — M86172 Other acute osteomyelitis, left ankle and foot: Secondary | ICD-10-CM | POA: Diagnosis not present

## 2022-09-22 MED ORDER — DOXYCYCLINE HYCLATE 100 MG PO TABS: 100.0000 mg | ORAL_TABLET | Freq: Two times a day (BID) | ORAL | 0 refills | Status: DC

## 2022-09-22 NOTE — Telephone Encounter (Addendum)
Results called to patient who verbalizes understanding!   ----- Message from Alver Sorrow sent at 09/22/2022  7:43 AM EDT ----- CBC with no evidence of anemia nor infection.  Stable kidney function, normal electrolytes. Proceed with cardioversion as scheduled.

## 2022-09-22 NOTE — Progress Notes (Signed)
  No chief complaint on file.   Subjective:  Patient presents today status post partial left great toe amputation performed inpatient.  DOS: 08/02/2022.  From the amputation standpoint he is doing well but concerned with the second toe.  No fevers or chills he has swelling. Objective/Physical Exam General: AAO x3, NAD  Dermatological: Status post hallux hypertension which was to be healing well, scar has formed.  At the distal aspect of second toe is a hyperkeratotic lesion upon debridement there was serosanguineous drainage expressed.  Granular wound present with any probing to bone or tunneling.  There is edema present to the toe.  No fluctuation or crepitation.  No malodor.  Vascular: Dorsalis Pedis artery and Posterior Tibial artery pedal pulses are palpable bilateral with immedate capillary fill time. There is no pain with calf compression, swelling, warmth, erythema.   Neruologic: Sensation decreased.  Musculoskeletal: Hammertoes present  Assessment: 1. s/p partial left great toe amputation. DOS: 08/02/2022 2.  Hammertoe second digit left with concern for osteomyelitis  Plan of Care:  -X-ray obtained reviewed of the left foot.  3 views were obtained.  Digital contractures noted but there is cortical changes of the distal phalanx distally present approximately this.  Edema present to the digit. -Discussed amputation toe versus salvage of the toe.  I would do a flexor tenotomy to take the pressure off of this area however he was not able to place the boot on top of the week.  Discussed the longer the wound is present and he could get worse quickly.  The risk of amputation.  I sharply debride the wound today using a 312 with scalpel to any complications.  I took a wound culture today.  Antibiotic ointment was applied followed by dressing.  Continue dressings and offloading.  Continue antibiotics.  Prescribed doxycycline.  He is to monitor closely for any signs or symptoms of worsening  infection report to the emergency room should any occur.  Plan for flexor tenotomy next appointment, repeat x-ray  Return in about 1 week (around 09/29/2022).  Vivi Barrack DPM

## 2022-09-22 NOTE — Addendum Note (Signed)
Addended by: Kristian Covey on: 09/22/2022 04:51 PM   Modules accepted: Orders

## 2022-09-22 NOTE — Patient Instructions (Signed)
Monitor for any signs/symptoms of infection. Call the office immediately if any occur or go directly to the emergency room. Call with any questions/concerns.  

## 2022-10-05 ENCOUNTER — Ambulatory Visit (INDEPENDENT_AMBULATORY_CARE_PROVIDER_SITE_OTHER): Payer: Medicare PPO

## 2022-10-05 ENCOUNTER — Telehealth: Payer: Self-pay | Admitting: Podiatry

## 2022-10-05 ENCOUNTER — Ambulatory Visit: Payer: Medicare PPO | Admitting: Podiatry

## 2022-10-05 DIAGNOSIS — M2042 Other hammer toe(s) (acquired), left foot: Secondary | ICD-10-CM

## 2022-10-05 DIAGNOSIS — L97522 Non-pressure chronic ulcer of other part of left foot with fat layer exposed: Secondary | ICD-10-CM

## 2022-10-05 DIAGNOSIS — M86172 Other acute osteomyelitis, left ankle and foot: Secondary | ICD-10-CM

## 2022-10-05 MED ORDER — DOXYCYCLINE HYCLATE 100 MG PO TABS: 100.0000 mg | ORAL_TABLET | Freq: Two times a day (BID) | ORAL | 0 refills | Status: DC

## 2022-10-05 NOTE — Progress Notes (Signed)
Unable to reach patient about procedure, but was able to leave a detailed message. Stated that the patient needed to arrive at the hospital at 0830 , remain NPO after 0000, needs to have a ride home and a responsible adult to stay with them for 24 hours after the procedure. Instructed the patient to call back if they had any questions.

## 2022-10-05 NOTE — Patient Instructions (Signed)

## 2022-10-05 NOTE — Telephone Encounter (Signed)
DOS - 10/14/2022  Tracking #[id number]  Primary diagnosis Y78.295 - Non-pressure chronic ulcer of other part of left foot with fat layer exposed  Ordering provider Vivi Barrack DPM / NPI - [id number]  Performing or attending provider Vivi Barrack [facility] / NPI - [id number]  Performing facility or agency [facility] / NPI - [id number]  Dates of service 10/14/2022 - 01/12/2023  Expedited No  Uncategorized Service Code Status Description [phone] 1 unit requested Amputation, toe; interphalangeal joint  NO AUTH REQUIRED

## 2022-10-06 ENCOUNTER — Ambulatory Visit (HOSPITAL_COMMUNITY): Payer: Medicare PPO | Admitting: Anesthesiology

## 2022-10-06 ENCOUNTER — Ambulatory Visit (HOSPITAL_BASED_OUTPATIENT_CLINIC_OR_DEPARTMENT_OTHER): Payer: Medicare PPO

## 2022-10-06 ENCOUNTER — Ambulatory Visit (HOSPITAL_COMMUNITY)
Admission: RE | Admit: 2022-10-06 | Discharge: 2022-10-06 | Disposition: A | Discharge status: Home / Self Care | Payer: Medicare PPO | Source: Ambulatory Visit | Attending: Cardiovascular Disease | Admitting: Cardiovascular Disease

## 2022-10-06 ENCOUNTER — Encounter (HOSPITAL_COMMUNITY)
Admission: RE | Disposition: A | Discharge status: Home / Self Care | Payer: Self-pay | Source: Ambulatory Visit | Attending: Cardiovascular Disease

## 2022-10-06 ENCOUNTER — Encounter (HOSPITAL_COMMUNITY): Payer: Self-pay | Admitting: Cardiovascular Disease

## 2022-10-06 ENCOUNTER — Other Ambulatory Visit: Payer: Self-pay

## 2022-10-06 DIAGNOSIS — G4733 Obstructive sleep apnea (adult) (pediatric): Secondary | ICD-10-CM | POA: Insufficient documentation

## 2022-10-06 DIAGNOSIS — I5032 Chronic diastolic (congestive) heart failure: Secondary | ICD-10-CM | POA: Diagnosis not present

## 2022-10-06 DIAGNOSIS — I701 Atherosclerosis of renal artery: Secondary | ICD-10-CM | POA: Diagnosis not present

## 2022-10-06 DIAGNOSIS — I4892 Unspecified atrial flutter: Secondary | ICD-10-CM

## 2022-10-06 DIAGNOSIS — Z79899 Other long term (current) drug therapy: Secondary | ICD-10-CM | POA: Diagnosis not present

## 2022-10-06 DIAGNOSIS — Z6836 Body mass index (BMI) 36.0-36.9, adult: Secondary | ICD-10-CM | POA: Insufficient documentation

## 2022-10-06 DIAGNOSIS — E669 Obesity, unspecified: Secondary | ICD-10-CM | POA: Diagnosis not present

## 2022-10-06 DIAGNOSIS — Z7901 Long term (current) use of anticoagulants: Secondary | ICD-10-CM | POA: Insufficient documentation

## 2022-10-06 DIAGNOSIS — E785 Hyperlipidemia, unspecified: Secondary | ICD-10-CM | POA: Insufficient documentation

## 2022-10-06 DIAGNOSIS — I110 Hypertensive heart disease with heart failure: Secondary | ICD-10-CM | POA: Insufficient documentation

## 2022-10-06 HISTORY — PX: CARDIOVERSION: SHX1299

## 2022-10-06 SURGERY — CARDIOVERSION
Anesthesia: General

## 2022-10-06 MED ORDER — SODIUM CHLORIDE 0.9 % IV SOLN
INTRAVENOUS | Status: DC | PRN
Start: 2022-10-06 — End: 2022-10-07

## 2022-10-06 MED ORDER — SODIUM CHLORIDE 0.9 % IV SOLN: INTRAVENOUS | Status: DC

## 2022-10-06 MED ORDER — PROPOFOL 10 MG/ML IV BOLUS
INTRAVENOUS | Status: DC | PRN
  Administered 2022-10-06: 50 mg via INTRAVENOUS

## 2022-10-06 SURGICAL SUPPLY — 1 items: ELECT DEFIB PAD ADLT CADENCE (PAD) ×1 IMPLANT

## 2022-10-06 NOTE — H&P (View-Only) (Signed)
   Subjective:  Chief Complaint  Patient presents with   Foot Ulcer    Left foot toe ulcer     Patient presents today status post partial left great toe amputation performed inpatient.  DOS: 08/02/2022.  This is healed well and he presents today for evaluation of ulceration, infection to his left second toe.  He presents today for possible flexor tenotomy as well.  He does not report any fevers or chills.  There is dystrophic changes to the reports.  He recently has traveled and he states he might need to travel again coming up soon.    Objective/Physical Exam General: AAO x3, NAD  Dermatological: Status post hallux imitation the scar is well-healed.  The distal aspect of the second toe there is a ulceration present with granulation tissue does not probe to bone.  There is edema and erythema present at the toe without any is any size.  There is no drainage or pus.  No other crepitation, malodor.  Vascular: Dorsalis Pedis artery and Posterior Tibial artery pedal pulses are palpable bilateral with immedate capillary fill time. There is no pain with calf compression, swelling, warmth, erythema.   Neruologic: Sensation decreased.  Musculoskeletal: Hammertoes present  Assessment: 1. s/p partial left great toe amputation. DOS: 08/02/2022 2.  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  -X-rays were obtained reviewed.  There is Obstruction suggestive of osteomyelitis of the distal aspect of the second toe.  X-rays were obtained after tenotomy.  Initially went appear to be stable and we discussed flexor tenotomy previously is in order to attempt to establish the toe to help take pressure off the toe.  He want to proceed with this today.  I cleaned skin with alcohol and x-ray of 3 mL of lidocaine, Marcaine plain was infiltrated in a regional block fashion.  Once anesthetized the toe was prepped with Betadine, alcohol.  I  introduced an 18-gauge needle to the plantar aspect of the toe in order to cut the flexor tendon.  At this time the toe sitting in a more rectus position.  Area with alcohol.  Antibiotic ointment was applied followed by dressing.  Tolerated procedure well and complications.  After this x-rays were performed which revealed osteomyelitis.  I discussed with him likely partial amputation of the digit.  After discussion he wished to proceed with this.   -The incision placement as well as the postoperative course was discussed with the patient. I discussed risks of the surgery which include, but not limited to, infection, bleeding, pain, swelling, need for further surgery, delayed or nonhealing, painful or ugly scar, numbness or sensation changes, over/under correction, recurrence, transfer lesions, further deformity, hardware failure, DVT/PE, loss of toe/foot. Patient understands these risks and wishes to proceed with surgery. The surgical consent was reviewed with the patient all 3 pages were signed. No promises or guarantees were given to the outcome of the procedure. All questions were answered to the best of my ability. Before the surgery the patient was encouraged to call the office if there is any further questions. The surgery will be performed at the hospital on an outpatient basis.  Vivi Barrack DPM

## 2022-10-06 NOTE — Anesthesia Preprocedure Evaluation (Signed)
Anesthesia Evaluation  Patient identified by MRN, date of birth, ID band Patient awake    Reviewed: Allergy & Precautions, NPO status , Patient's Chart, lab work & pertinent test results  History of Anesthesia Complications Negative for: history of anesthetic complications  Airway Mallampati: III  TM Distance: >3 FB Neck ROM: Full    Dental  (+) Dental Advisory Given, Teeth Intact   Pulmonary shortness of breath, sleep apnea , neg COPD, neg recent URI   breath sounds clear to auscultation       Cardiovascular hypertension, Pt. on medications and Pt. on home beta blockers (-) angina +CHF and + Orthopnea  (-) Past MI + dysrhythmias Atrial Fibrillation  Rhythm:Irregular   1. Left ventricular ejection fraction, by estimation, is 55 to 60%. The  left ventricle has no regional wall motion abnormalities. There is  moderate concentric left ventricular hypertrophy. Left ventricular  diastolic parameters are indeterminate.   2. Right ventricular systolic function is low normal. The right  ventricular size is mildly enlarged.   3. Left atrial size was moderately dilated.   4. Right atrial size was moderately dilated.   5. Mild mitral valve regurgitation.   6. Pulmonic valve regurgitation is moderate.   7. Adominial aorta measures 4.8 cm Recommend dedicated evaluation of  aorta.     Neuro/Psych negative neurological ROS  negative psych ROS   GI/Hepatic negative GI ROS, Neg liver ROS,,,  Endo/Other    Renal/GU CRFRenal diseaseLab Results      Component                Value               Date                      NA                       143                 09/21/2022                K                        4.6                 09/21/2022                CO2                      26                  09/21/2022                GLUCOSE                  96                  09/21/2022                BUN                      16                   09/21/2022                CREATININE  1.47 (H)            09/21/2022                CALCIUM                  9.6                 09/21/2022                EGFR                     47 (L)              09/21/2022                GFRNONAA                 47 (L)              08/03/2022                Musculoskeletal  (+) Arthritis ,    Abdominal   Peds  Hematology  (+) Blood dyscrasia Lab Results      Component                Value               Date                      WBC                      7.6                 09/21/2022                HGB                      13.3                09/21/2022                HCT                      41.1                09/21/2022                MCV                      85                  09/21/2022                PLT                      180                 09/21/2022             eliquis   Anesthesia Other Findings   Reproductive/Obstetrics                              Anesthesia Physical Anesthesia Plan  ASA: 3  Anesthesia Plan: General   Post-op Pain Management: Minimal or no pain anticipated   Induction: Intravenous  PONV Risk Score and Plan: 2 and  Treatment may vary due to age or medical condition  Airway Management Planned: Nasal Cannula, Natural Airway and Simple Face Mask  Additional Equipment: None  Intra-op Plan:   Post-operative Plan:   Informed Consent: I have reviewed the patients History and Physical, chart, labs and discussed the procedure including the risks, benefits and alternatives for the proposed anesthesia with the patient or authorized representative who has indicated his/her understanding and acceptance.     Dental advisory given  Plan Discussed with: Anesthesiologist  Anesthesia Plan Comments:          Anesthesia Quick Evaluation

## 2022-10-06 NOTE — Progress Notes (Signed)
   Subjective:  Chief Complaint  Patient presents with   Foot Ulcer    Left foot toe ulcer     Patient presents today status post partial left great toe amputation performed inpatient.  DOS: 08/02/2022.  This is healed well and he presents today for evaluation of ulceration, infection to his left second toe.  He presents today for possible flexor tenotomy as well.  He does not report any fevers or chills.  There is dystrophic changes to the reports.  He recently has traveled and he states he might need to travel again coming up soon.    Objective/Physical Exam General: AAO x3, NAD  Dermatological: Status post hallux imitation the scar is well-healed.  The distal aspect of the second toe there is a ulceration present with granulation tissue does not probe to bone.  There is edema and erythema present at the toe without any is any size.  There is no drainage or pus.  No other crepitation, malodor.  Vascular: Dorsalis Pedis artery and Posterior Tibial artery pedal pulses are palpable bilateral with immedate capillary fill time. There is no pain with calf compression, swelling, warmth, erythema.   Neruologic: Sensation decreased.  Musculoskeletal: Hammertoes present  Assessment: 1. s/p partial left great toe amputation. DOS: 08/02/2022 2.  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  -X-rays were obtained reviewed.  There is Obstruction suggestive of osteomyelitis of the distal aspect of the second toe.  X-rays were obtained after tenotomy.  Initially went appear to be stable and we discussed flexor tenotomy previously is in order to attempt to establish the toe to help take pressure off the toe.  He want to proceed with this today.  I cleaned skin with alcohol and x-ray of 3 mL of lidocaine, Marcaine plain was infiltrated in a regional block fashion.  Once anesthetized the toe was prepped with Betadine, alcohol.  I  introduced an 18-gauge needle to the plantar aspect of the toe in order to cut the flexor tendon.  At this time the toe sitting in a more rectus position.  Area with alcohol.  Antibiotic ointment was applied followed by dressing.  Tolerated procedure well and complications.  After this x-rays were performed which revealed osteomyelitis.  I discussed with him likely partial amputation of the digit.  After discussion he wished to proceed with this.   -The incision placement as well as the postoperative course was discussed with the patient. I discussed risks of the surgery which include, but not limited to, infection, bleeding, pain, swelling, need for further surgery, delayed or nonhealing, painful or ugly scar, numbness or sensation changes, over/under correction, recurrence, transfer lesions, further deformity, hardware failure, DVT/PE, loss of toe/foot. Patient understands these risks and wishes to proceed with surgery. The surgical consent was reviewed with the patient all 3 pages were signed. No promises or guarantees were given to the outcome of the procedure. All questions were answered to the best of my ability. Before the surgery the patient was encouraged to call the office if there is any further questions. The surgery will be performed at the hospital on an outpatient basis.  Vivi Barrack DPM

## 2022-10-06 NOTE — Transfer of Care (Signed)
Immediate Anesthesia Transfer of Care Note  Patient: Pedro Lee  Procedure(s) Performed: CARDIOVERSION  Patient Location: Cath Lab  Anesthesia Type:General  Level of Consciousness: awake and patient cooperative  Airway & Oxygen Therapy: Patient Spontanous Breathing and Patient connected to nasal cannula oxygen  Post-op Assessment: Report given to RN and Post -op Vital signs reviewed and stable  Post vital signs: Reviewed and stable  Last Vitals:  Vitals Value Taken Time  BP 143/104 10/06/22 0930  Temp    Pulse 65 10/06/22 0937  Resp 18 10/06/22 0937  SpO2 97 % 10/06/22 0937  Vitals shown include unfiled device data.  Last Pain:  Vitals:   10/06/22 0823  TempSrc: Temporal  PainSc: 5          Complications: No notable events documented.

## 2022-10-06 NOTE — CV Procedure (Signed)
Electrical Cardioversion Procedure Note Pedro Lee [id number] 10-12-1940  Procedure: Electrical Cardioversion Indications:  Atrial Flutter  Procedure Details Consent: Risks of procedure as well as the alternatives and risks of each were explained to the (patient/caregiver).  Consent for procedure obtained. Time Out: Verified patient identification, verified procedure, site/side was marked, verified correct patient position, special equipment/implants available, medications/allergies/relevent history reviewed, required imaging and test results available.  Performed  Patient placed on cardiac monitor, pulse oximetry, supplemental oxygen as necessary.  Sedation given:  propofol Pacer pads placed anterior and posterior chest.  Cardioverted 1 time(s).  Cardioverted at 200J.  Evaluation Findings: Post procedure EKG shows:  sinus bradycardia Complications: None Patient did tolerate procedure well.   Chilton Si, MD 10/06/2022, 9:51 AM

## 2022-10-06 NOTE — Interval H&P Note (Signed)
History and Physical Interval Note:  10/06/2022 8:46 AM  Pedro Lee  has presented today for surgery, with the diagnosis of AFLUTTER.  The various methods of treatment have been discussed with the patient and family. After consideration of risks, benefits and other options for treatment, the patient has consented to  Procedure(s): CARDIOVERSION (N/A) as a surgical intervention.  The patient's history has been reviewed, patient examined, no change in status, stable for surgery.  I have reviewed the patient's chart and labs.  Questions were answered to the patient's satisfaction.     Chilton Si, MD

## 2022-10-07 ENCOUNTER — Encounter (HOSPITAL_COMMUNITY): Payer: Self-pay | Admitting: Cardiovascular Disease

## 2022-10-07 ENCOUNTER — Ambulatory Visit (HOSPITAL_BASED_OUTPATIENT_CLINIC_OR_DEPARTMENT_OTHER)
Admission: RE | Admit: 2022-10-07 | Discharge: 2022-10-07 | Disposition: A | Discharge status: Home / Self Care | Payer: Medicare PPO | Source: Ambulatory Visit | Attending: Family | Admitting: Family

## 2022-10-07 DIAGNOSIS — I714 Abdominal aortic aneurysm, without rupture, unspecified: Secondary | ICD-10-CM | POA: Diagnosis not present

## 2022-10-07 MED ORDER — IOHEXOL 350 MG/ML SOLN
100.0000 mL | Freq: Once | INTRAVENOUS | Status: AC | PRN
  Administered 2022-10-07: 100 mL via INTRAVENOUS

## 2022-10-07 NOTE — Anesthesia Postprocedure Evaluation (Signed)
Anesthesia Post Note  Patient: JABARIE SAPON  Procedure(s) Performed: CARDIOVERSION     Patient location during evaluation: Cath Lab Anesthesia Type: General Level of consciousness: awake and alert Pain management: pain level controlled Vital Signs Assessment: post-procedure vital signs reviewed and stable Respiratory status: spontaneous breathing, nonlabored ventilation, respiratory function stable, patient connected to nasal cannula oxygen and non-rebreather facemask Cardiovascular status: blood pressure returned to baseline and stable Postop Assessment: no apparent nausea or vomiting Anesthetic complications: no   No notable events documented.  Last Vitals:  Vitals:   10/06/22 1020 10/06/22 1027  BP: 126/83 136/87  Pulse:  69  Resp:  16  Temp:    SpO2:  96%    Last Pain:  Vitals:   10/06/22 1027  TempSrc:   PainSc: 0-No pain                 Tyannah Sane

## 2022-10-08 ENCOUNTER — Telehealth (HOSPITAL_BASED_OUTPATIENT_CLINIC_OR_DEPARTMENT_OTHER): Payer: Self-pay

## 2022-10-08 NOTE — Telephone Encounter (Addendum)
Results called to patient who verbalizes understanding!     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  No significant abdominal aorta dilation noted. Good result!

## 2022-10-09 ENCOUNTER — Other Ambulatory Visit: Payer: Self-pay

## 2022-10-09 ENCOUNTER — Encounter (HOSPITAL_COMMUNITY): Payer: Self-pay | Admitting: Podiatry

## 2022-10-09 NOTE — Progress Notes (Signed)
PCP - Dr. Willey Blade Cardiologist - Dr. Chilton Si  PPM/ICD - Denies Device Orders - n/a Rep Notified - n/a  Chest x-ray - 05-03-19 EKG - 09-2722 Stress Test - 11-07-19 ECHO - 09-18-22 Cardiac Cath -   CPAP - Reports unable to tolerate CPAP for the last year  DM- Denies  Blood Thinner Instructions: Eliquis made patient to follow up cardiologist about procedure on 10-14-22 Aspirin Instructions: n/a  ERAS Protcol - ERAS no drink  COVID TEST- n/a  Anesthesia review: yes Cardiac hx. Patient also on ABT for infection in foot. ABT provided by Podiatrist has 4-5 days left per patient  Patient verbally denies any shortness of breath, fever, cough and chest pain during phone call   -------------  SDW INSTRUCTIONS given:  Your procedure is scheduled on Sept.4,2024.  Report to [facility] Main Entrance "A" at 12:30 P.M., and check in at the Admitting office.  Call this number if you have problems the morning of surgery:  [phone]   Remember:  Do not eat after midnight the night before your surgery  You may drink clear liquids until 12:00 P.M. the morning of your surgery.   Clear liquids allowed are: Water, Non-Citrus Juices (without pulp), Carbonated Beverages, Clear Tea, Black Coffee Only, and Gatorade    Take these medicines the morning of surgery with A SIP OF WATER  allopurinol (ZYLOPRIM)  apixaban (ELIQUIS)  carvedilol (COREG)  rosuvastatin (CRESTOR   IF NEEDED: fluticasone (FLONASE)  montelukast (SINGULAIR)  Polyethyl Glycol-Propyl Glycol (SYSTANE)   As of today, STOP taking any Aspirin (unless otherwise instructed by your surgeon) Aleve, Naproxen, Ibuprofen, Motrin, Advil, Goody's, BC's, all herbal medications, fish oil, and all vitamins.                      Do not wear jewelry, make up, or nail polish            Do not wear lotions, powders, perfumes/colognes, or deodorant.            Do not shave 48 hours prior to surgery.  Men may shave face and neck.             Do not bring valuables to the hospital.            [facility] is not responsible for any belongings or valuables.  Do NOT Smoke (Tobacco/Vaping) 24 hours prior to your procedure If you use a CPAP at night, you may bring all equipment for your overnight stay.   Contacts, glasses, dentures or bridgework may not be worn into surgery.      For patients admitted to the hospital, discharge time will be determined by your treatment team.   Patients discharged the day of surgery will not be allowed to drive home, and someone needs to stay with them for 24 hours.    Special instructions:   [facility]- Preparing For Surgery  Before surgery, you can play an important role. Because skin is not sterile, your skin needs to be as free of germs as possible. You can reduce the number of germs on your skin by washing with CHG (chlorahexidine gluconate) Soap before surgery.  CHG is an antiseptic cleaner which kills germs and bonds with the skin to continue killing germs even after washing.    Oral Hygiene is also important to reduce your risk of infection.  Remember - BRUSH YOUR TEETH THE MORNING OF SURGERY WITH YOUR REGULAR TOOTHPASTE  Please do not use  if you have an allergy to CHG or antibacterial soaps. If your skin becomes reddened/irritated stop using the CHG.  Do not shave (including legs and underarms) for at least 48 hours prior to first CHG shower. It is OK to shave your face.  Please follow these instructions carefully.   Shower the NIGHT BEFORE SURGERY and the MORNING OF SURGERY with DIAL Soap.   Pat yourself dry with a CLEAN TOWEL.  Wear CLEAN PAJAMAS to bed the night before surgery  Place CLEAN SHEETS on your bed the night of your first shower and DO NOT SLEEP WITH PETS.   Day of Surgery: Please shower morning of surgery  Wear Clean/Comfortable clothing the morning of surgery Do not apply any deodorants/lotions.   Remember to brush your teeth WITH YOUR REGULAR TOOTHPASTE.    Questions were answered. Patient verbalized understanding of instructions.

## 2022-10-13 ENCOUNTER — Encounter: Payer: Self-pay | Admitting: Podiatry

## 2022-10-13 NOTE — Progress Notes (Addendum)
Anesthesia Chart Review: SAME DAY WORK-UP  Case: [id number] Date/Time: 10/14/22 1445   Procedure: EXCISION PARTIAL PHALANX OF 2ND LEFT TOE (Left)   Anesthesia type: Choice   Pre-op diagnosis: TOE ULCER WITH FAT LAYER EXPOSED OF 2ND LEFT TOE   Location: [facility] ROOM 10 / [facility] OR   Surgeons: Vivi Barrack, DPM       DISCUSSION: Patient is an 82 year old male scheduled for the above procedure.  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  He had radiofrequency catheter ablation for AVNRT x2 by Dr. Sherryl Manges. More recently, has been followed by cardiologist Dr. Duke Salvia in [facility]. At 08/25/22 APP visit, he was noted to be in new onset atrial flutter. He was started on Elqiuis 5 mg BID and carvedilol increased from 12.5 mg to 18.75 mg BID. Creatinine stable at 1.35, thyroid panel normal, and H/H 12.8/39.9 on 09/08/22. H/ 09/18/22 echo showed LVEF 55-60%, no regional wall motion abnormalities, moderate concentric LVH, LV diastolic parameters were indeterminate, normal RV systolic function, moderately dilated LA and RA, mild MR, moderate TR. DCCV planned if he remained in aflutter. S/p successful DCCV on 10/06/22.   He is s/p DCCV on 10/06/22. He is on Eliquis, and presumably would need to continue at least 4 weeks afterwards--but would be at the discretion of Dr. Duke Salvia. Per PAT phone RN, Mr. Scarcella was unsure about periproceduere Eliquis instructions, so he was advised to follow-up with podiatrist and cardiology. I also spoke with  Anisha at Dr. Gabriel Rung office. She is going to follow-up with Dr. Leonides Sake office and Mr. Cogburn to clarify. (UPDATE: 10/13/22 11:18 AM: Ileene Rubens sent an update indicating Dr. Duke Salvia and Dr. Ardelle Anton were okay with patient holding Eliquis starting today, but to resume post-operatively.)   VS: Ht 6\' 5"  (1.956 m)   Wt (!) 142.9 kg   BMI 37.36 kg/m  BP Readings from Last 3 Encounters:  10/06/22 136/87  09/21/22 122/78  09/08/22 118/64   Pulse Readings from Last 3 Encounters:  10/06/22 69  09/21/22 71  09/08/22 72     PROVIDERS: Gwenyth Bender, MD is PCP  Chilton Si, MD is cardiologist   LABS: For day of surgery as indicated. Most recent lab results in [facility] include: Lab Results  Component Value Date   WBC 7.6 09/21/2022   HGB 13.3 09/21/2022   HCT 41.1 09/21/2022   PLT 180 09/21/2022   GLUCOSE 96 09/21/2022   ALT 13 09/08/2022   AST 17 09/08/2022   NA 143 09/21/2022   K 4.6 09/21/2022   CL 105 09/21/2022   CREATININE 1.47 (H) 09/21/2022   BUN 16 09/21/2022   CO2 26 09/21/2022   TSH 2.510 09/08/2022  Creatinine 1.26-1.47 in CHL since 01/29/21, primarily ~ 1.4 range.    Sleep Study/NPSG 04/18/20: IMPRESSIONS - Moderate obstructive sleep apnea occurred during this study (AHI = 19.3/h). - Insufficient early events to meet protocol requirement for split CPAP titration. - Moderate oxygen desaturation was noted during this study (Min O2 = 80.0%).  Mean O2 saturation 93.8% - The patient snored with loud snoring volume. - EKG findings include occasional PVCs. - Clinically significant  periodic limb movements did not occur during sleep.   RECOMMENDATIONS - Suggest CPAP titration sleep study or autopap. Other options would be based on clinical judgment.   IMAGES: CTA Abd/pelvis 10/07/22: IMPRESSION: 1. Unchanged 2.5 cm right common iliac artery aneurysm. 2. No acute abnormality of the abdomen or pelvis. 3. Diffuse hepatic steatosis. 4. Markedly enlarged prostate  compressing the bladder neck and possibly extending into the bladder lumen. The overall appearance of the prostate and bladder is similar to that seen 01/08/2021.   US Renal Artery 01/27/22: Summary:  Largest Aortic Diameter: 3.2 cm  Renal:  Right: Normal size right kidney. Normal right Resisitive Index.         Normal cortical thickness of right kidney. No evidence of         right renal artery stenosis.  Left:  Abnormal size for the left kidney. Normal left Resistive         Index. LRV flow present. 1-59% stenosis of the left renal         artery. Normal cortical thickness of the left kidney.  Mesenteric:  Normal Celiac artery and Superior Mesenteric artery findings.    EKG:  EKG 10/06/22: Sinus rhythm with marked sinus arrhythmia Nonspecific T wave abnormality Abnormal ECG When compared with ECG of 08-Sep-2022 14:53, atrial flutter no longer present Confirmed by Thomasene Ripple [phone]) on 10/06/2022 8:34:09 PM  EKG 09/08/22: Rate controlled atrial flutter at 72 bpm Confirmed by Gillian Shields ([id number]) on 09/08/2022 4:45:00 PM   CV: Echo 09/18/22:  IMPRESSIONS   1. Left ventricular ejection fraction, by estimation, is 55 to 60%. The  left ventricle has no regional wall motion abnormalities. There is  moderate concentric left ventricular hypertrophy. Left ventricular  diastolic parameters are indeterminate.   2. Right ventricular systolic function is low normal. The right  ventricular size is mildly enlarged.   3. Left atrial size was moderately dilated.   4. Right atrial size was moderately dilated.   5. Mild mitral valve regurgitation.   6. Pulmonic valve regurgitation is moderate.   7. Adominial aorta measures 4.8 cm Recommend dedicated evaluation of  aorta. The aortic root and ascending aorta are structurally normal, with  no evidence of dilitation.  Ao Root diam: 3.75 cm  Ao Asc diam:  3.60 cm  Ao Arch diam: 4.2 cm  Comparison(s): EF 65%, moderate LVH, RVSP 40.5 mmHg,  asc aor 41mm.  - CTA Abd/pelvis 10/06/21: No flow-limiting stenosis or aneurysm noted in the abdominal aorta.  Unchanged 2.5 cm right common iliac artery aneurysm.   ABIs 08/03/22: Summary:  Right: Resting right ankle-brachial index is within normal range.  Left: Resting left ankle-brachial index is within normal range.    Nuclear stress test 11/07/19: Nuclear stress EF: 60%. The left ventricular ejection fraction is normal (55-65%). There was no ST segment deviation noted during stress. The study is normal.   Past Medical History:  Diagnosis Date   Acute on chronic diastolic heart failure (HCC) 12/22/2019   Allergy    sesonal   Complication of anesthesia    HX OF ENLARGED PROSTATE AND UNABLE TO PASS WATER AFTER ANESTHESIA   Dyspnea    with some exertion   Dysrhythmia    HX ATRIAL FIBRILLATION - HAD ABLATION - NO LONGER HAS AF.   Essential hypertension 12/22/2019   Essential hypertension, malignant    Exertional dyspnea 01/27/2021   Gout, unspecified    Lower extremity edema 12/22/2019   Obesity, mild    OSA (obstructive  sleep apnea)    Mild - PT STATES HE DID NOT HAVE TO USE CPAP   Osteoarthritis    Osteoarthrosis, unspecified whether generalized or localized, unspecified site    Other dyspnea and respiratory abnormality    Prostate enlargement    DR. NESI IS PT'S UROLOGIST   Pure hypercholesterolemia    Pure hypercholesterolemia 01/27/2021   Renal insufficiency, mild    PT STATES NOT AWARE OF ANY KIDNEY PROBLEM    Past Surgical History:  Procedure Laterality Date   AMPUTATION TOE Left 08/02/2022   Procedure: LEFT GREAT TOE AMPUTATION;  Surgeon: Vivi Barrack, DPM;  Location: [facility] ORS;  Service: Podiatry;  Laterality: Left;   CARDIOVERSION N/A 10/06/2022   Procedure: CARDIOVERSION;  Surgeon: Chilton Si, MD;  Location: [facility] INVASIVE CV LAB;  Service: Cardiovascular;  Laterality: N/A;   COLONOSCOPY     CRANIOTOMY N/A 03/24/2016   Procedure: Transphenoidal  Resection of Tumor;  Surgeon: Julio Sicks, MD;  Location: [facility] OR;  Service: Neurosurgery;  Laterality: N/A;  Transphenoidal Resection of Tumor   HEART ABLATION FOR AF  1998   JOINT REPLACEMENT     LEFT TOTAL KNEE REPLACEMENT   KNEE ARTHROPLASTY Left 2013ish   LEFT KNEE ARTHROSCOPY     TOTAL KNEE ARTHROPLASTY Right 05/30/2013   Procedure: RIGHT TOTAL KNEE ARTHROPLASTY;  Surgeon: Shelda Pal, MD;  Location: [facility] ORS;  Service: Orthopedics;  Laterality: Right;   TRANSNASAL APPROACH N/A 03/24/2016   Procedure: TRANSNASAL APPROACH;  Surgeon: Drema Halon, MD;  Location: [facility] OR;  Service: ENT;  Laterality: N/A;  TRANSNASAL APPROACH    MEDICATIONS: No current facility-administered medications for this encounter.    allopurinol (ZYLOPRIM) 300 MG tablet   amLODipine (NORVASC) 5 MG tablet   apixaban (ELIQUIS) 5 MG TABS tablet   carvedilol (COREG) 12.5 MG tablet   colchicine 0.6 MG tablet   doxazosin (CARDURA) 2 MG tablet   doxycycline (VIBRA-TABS) 100 MG tablet   doxycycline (VIBRA-TABS) 100 MG tablet   fluticasone (FLONASE) 50 MCG/ACT nasal spray   furosemide (LASIX) 40 MG tablet   hydrALAZINE (APRESOLINE) 25 MG tablet   montelukast (SINGULAIR) 10 MG tablet   Polyethyl Glycol-Propyl Glycol (SYSTANE) 0.4-0.3 % SOLN   rosuvastatin (CRESTOR) 20 MG tablet   tamsulosin (FLOMAX) 0.4 MG CAPS capsule    Shonna Chock, PA-C Surgical Short Stay/Anesthesiology [facility] Phone [phone] [facility] Phone [phone] 10/13/2022 10:39 AM

## 2022-10-13 NOTE — Anesthesia Preprocedure Evaluation (Addendum)
Anesthesia Evaluation  Patient identified by MRN, date of birth, ID band Patient awake    Reviewed: Allergy & Precautions, NPO status , Patient's Chart, lab work & pertinent test results  Airway Mallampati: II  TM Distance: >3 FB Neck ROM: Full    Dental no notable dental hx.    Pulmonary sleep apnea    Pulmonary exam normal        Cardiovascular hypertension, Pt. on medications and Pt. on home beta blockers +CHF  Normal cardiovascular exam+ dysrhythmias Atrial Fibrillation   ECHO: 1. Left ventricular ejection fraction, by estimation, is 55 to 60%. The  left ventricle has no regional wall motion abnormalities. There is  moderate concentric left ventricular hypertrophy. Left ventricular  diastolic parameters are indeterminate.   2. Right ventricular systolic function is low normal. The right  ventricular size is mildly enlarged.   3. Left atrial size was moderately dilated.   4. Right atrial size was moderately dilated.   5. Mild mitral valve regurgitation.   6. Pulmonic valve regurgitation is moderate.   7. Adominial aorta measures 4.8 cm Recommend dedicated evaluation of  aorta     Neuro/Psych negative neurological ROS     GI/Hepatic negative GI ROS, Neg liver ROS,,,  Endo/Other  negative endocrine ROS    Renal/GU Renal InsufficiencyRenal disease     Musculoskeletal negative musculoskeletal ROS (+)    Abdominal  (+) + obese  Peds  Hematology  (+) Blood dyscrasia (Eliquis)   Anesthesia Other Findings TOE ULCER WITH FAT LAYER EXPOSED OF 2ND LEFT TOE  Reproductive/Obstetrics                             Anesthesia Physical Anesthesia Plan  ASA: 3  Anesthesia Plan: MAC   Post-op Pain Management:    Induction: Intravenous  PONV Risk Score and Plan: 1 and Ondansetron, Dexamethasone, Propofol infusion and Treatment may vary due to age or medical condition  Airway Management  Planned: Simple Face Mask  Additional Equipment:   Intra-op Plan:   Post-operative Plan:   Informed Consent: I have reviewed the patients History and Physical, chart, labs and discussed the procedure including the risks, benefits and alternatives for the proposed anesthesia with the patient or authorized representative who has indicated his/her understanding and acceptance.     Dental advisory given  Plan Discussed with: CRNA  Anesthesia Plan Comments: (PAT note written 10/13/2022 by Shonna Chock, PA-C.  )       Anesthesia Quick Evaluation

## 2022-10-14 ENCOUNTER — Encounter (HOSPITAL_COMMUNITY)
Admission: RE | Disposition: A | Discharge status: Home / Self Care | Payer: Self-pay | Source: Ambulatory Visit | Attending: Podiatry

## 2022-10-14 ENCOUNTER — Ambulatory Visit (HOSPITAL_BASED_OUTPATIENT_CLINIC_OR_DEPARTMENT_OTHER): Payer: Medicare PPO | Admitting: Anesthesiology

## 2022-10-14 ENCOUNTER — Ambulatory Visit (HOSPITAL_COMMUNITY): Payer: Medicare PPO

## 2022-10-14 ENCOUNTER — Ambulatory Visit (HOSPITAL_COMMUNITY): Payer: Self-pay | Admitting: Anesthesiology

## 2022-10-14 ENCOUNTER — Other Ambulatory Visit: Payer: Self-pay

## 2022-10-14 ENCOUNTER — Ambulatory Visit (HOSPITAL_COMMUNITY)
Admission: RE | Admit: 2022-10-14 | Discharge: 2022-10-14 | Disposition: A | Discharge status: Home / Self Care | Payer: Medicare PPO | Source: Ambulatory Visit | Attending: Podiatry | Admitting: Podiatry

## 2022-10-14 ENCOUNTER — Encounter (HOSPITAL_COMMUNITY): Payer: Self-pay | Admitting: Podiatry

## 2022-10-14 DIAGNOSIS — M86172 Other acute osteomyelitis, left ankle and foot: Secondary | ICD-10-CM

## 2022-10-14 DIAGNOSIS — M1990 Unspecified osteoarthritis, unspecified site: Secondary | ICD-10-CM | POA: Diagnosis not present

## 2022-10-14 DIAGNOSIS — I130 Hypertensive heart and chronic kidney disease with heart failure and stage 1 through stage 4 chronic kidney disease, or unspecified chronic kidney disease: Secondary | ICD-10-CM

## 2022-10-14 DIAGNOSIS — Z6835 Body mass index (BMI) 35.0-35.9, adult: Secondary | ICD-10-CM | POA: Diagnosis not present

## 2022-10-14 DIAGNOSIS — I340 Nonrheumatic mitral (valve) insufficiency: Secondary | ICD-10-CM | POA: Diagnosis not present

## 2022-10-14 DIAGNOSIS — I110 Hypertensive heart disease with heart failure: Secondary | ICD-10-CM | POA: Diagnosis not present

## 2022-10-14 DIAGNOSIS — M869 Osteomyelitis, unspecified: Secondary | ICD-10-CM | POA: Diagnosis present

## 2022-10-14 DIAGNOSIS — I371 Nonrheumatic pulmonary valve insufficiency: Secondary | ICD-10-CM | POA: Insufficient documentation

## 2022-10-14 DIAGNOSIS — E669 Obesity, unspecified: Secondary | ICD-10-CM | POA: Diagnosis not present

## 2022-10-14 DIAGNOSIS — L97522 Non-pressure chronic ulcer of other part of left foot with fat layer exposed: Secondary | ICD-10-CM

## 2022-10-14 DIAGNOSIS — I5032 Chronic diastolic (congestive) heart failure: Secondary | ICD-10-CM | POA: Insufficient documentation

## 2022-10-14 DIAGNOSIS — M109 Gout, unspecified: Secondary | ICD-10-CM | POA: Diagnosis not present

## 2022-10-14 DIAGNOSIS — M86672 Other chronic osteomyelitis, left ankle and foot: Secondary | ICD-10-CM | POA: Insufficient documentation

## 2022-10-14 DIAGNOSIS — N1831 Chronic kidney disease, stage 3a: Secondary | ICD-10-CM

## 2022-10-14 DIAGNOSIS — I4891 Unspecified atrial fibrillation: Secondary | ICD-10-CM | POA: Diagnosis not present

## 2022-10-14 DIAGNOSIS — L97529 Non-pressure chronic ulcer of other part of left foot with unspecified severity: Secondary | ICD-10-CM | POA: Insufficient documentation

## 2022-10-14 DIAGNOSIS — G4733 Obstructive sleep apnea (adult) (pediatric): Secondary | ICD-10-CM | POA: Diagnosis not present

## 2022-10-14 DIAGNOSIS — M2042 Other hammer toe(s) (acquired), left foot: Secondary | ICD-10-CM | POA: Diagnosis not present

## 2022-10-14 HISTORY — PX: EXCISION PARTIAL PHALANX: SHX6617

## 2022-10-14 SURGERY — EXCISION, PHALANX, PARTIAL
Anesthesia: Monitor Anesthesia Care | Site: Foot | Laterality: Left

## 2022-10-14 MED ORDER — ONDANSETRON HCL 4 MG/2ML IJ SOLN: 4.0000 mg | Freq: Once | INTRAMUSCULAR | Status: DC | PRN

## 2022-10-14 MED ORDER — LIDOCAINE HCL 2 % IJ SOLN
INTRAMUSCULAR | Status: DC | PRN
  Administered 2022-10-14: 20 mL

## 2022-10-14 MED ORDER — PROPOFOL 500 MG/50ML IV EMUL
INTRAVENOUS | Status: DC | PRN
  Administered 2022-10-14: 50 ug/kg/min via INTRAVENOUS

## 2022-10-14 MED ORDER — PHENYLEPHRINE 80 MCG/ML (10ML) SYRINGE FOR IV PUSH (FOR BLOOD PRESSURE SUPPORT)
PREFILLED_SYRINGE | INTRAVENOUS | Status: DC | PRN
  Administered 2022-10-14 (×2): 80 ug via INTRAVENOUS

## 2022-10-14 MED ORDER — DOXYCYCLINE HYCLATE 100 MG PO TABS: 100.0000 mg | ORAL_TABLET | Freq: Two times a day (BID) | ORAL | 0 refills | Status: DC

## 2022-10-14 MED ORDER — FENTANYL CITRATE (PF) 250 MCG/5ML IJ SOLN
INTRAMUSCULAR | Status: DC | PRN
  Administered 2022-10-14: 100 ug via INTRAVENOUS

## 2022-10-14 MED ORDER — CLINDAMYCIN PHOSPHATE 900 MG/50ML IV SOLN
900.0000 mg | INTRAVENOUS | Status: AC
  Administered 2022-10-14: 900 mg via INTRAVENOUS
  Filled 2022-10-14: qty 50

## 2022-10-14 MED ORDER — HYDROCODONE-ACETAMINOPHEN 5-325 MG PO TABS
1.0000 | ORAL_TABLET | Freq: Four times a day (QID) | ORAL | 0 refills | Status: DC | PRN
Start: 2022-10-14 — End: 2023-08-19

## 2022-10-14 MED ORDER — 0.9 % SODIUM CHLORIDE (POUR BTL) OPTIME
TOPICAL | Status: DC | PRN
  Administered 2022-10-14: 1000 mL

## 2022-10-14 MED ORDER — FENTANYL CITRATE (PF) 250 MCG/5ML IJ SOLN
INTRAMUSCULAR | Status: AC
  Filled 2022-10-14: qty 5

## 2022-10-14 MED ORDER — FENTANYL CITRATE (PF) 100 MCG/2ML IJ SOLN: 25.0000 ug | INTRAMUSCULAR | Status: DC | PRN

## 2022-10-14 MED ORDER — PHENYLEPHRINE 80 MCG/ML (10ML) SYRINGE FOR IV PUSH (FOR BLOOD PRESSURE SUPPORT)
PREFILLED_SYRINGE | INTRAVENOUS | Status: AC
  Filled 2022-10-14: qty 10

## 2022-10-14 MED ORDER — CHLORHEXIDINE GLUCONATE CLOTH 2 % EX PADS: 6.0000 | MEDICATED_PAD | Freq: Once | CUTANEOUS | Status: DC

## 2022-10-14 MED ORDER — BUPIVACAINE HCL (PF) 0.5 % IJ SOLN
INTRAMUSCULAR | Status: AC
  Filled 2022-10-14: qty 30

## 2022-10-14 MED ORDER — ACETAMINOPHEN 10 MG/ML IV SOLN: 1000.0000 mg | Freq: Once | INTRAVENOUS | Status: DC | PRN

## 2022-10-14 MED ORDER — CHLORHEXIDINE GLUCONATE 0.12 % MT SOLN
15.0000 mL | OROMUCOSAL | Status: AC
  Administered 2022-10-14: 15 mL via OROMUCOSAL
  Filled 2022-10-14: qty 15

## 2022-10-14 MED ORDER — EPHEDRINE 5 MG/ML INJ
INTRAVENOUS | Status: AC
  Filled 2022-10-14: qty 5

## 2022-10-14 MED ORDER — LIDOCAINE 2% (20 MG/ML) 5 ML SYRINGE
INTRAMUSCULAR | Status: DC | PRN
  Administered 2022-10-14: 60 mg via INTRAVENOUS

## 2022-10-14 MED ORDER — LACTATED RINGERS IV SOLN: INTRAVENOUS | Status: DC

## 2022-10-14 MED ORDER — EPHEDRINE SULFATE-NACL 50-0.9 MG/10ML-% IV SOSY
PREFILLED_SYRINGE | INTRAVENOUS | Status: DC | PRN
  Administered 2022-10-14 (×2): 5 mg via INTRAVENOUS
  Administered 2022-10-14: 10 mg via INTRAVENOUS

## 2022-10-14 MED ORDER — BUPIVACAINE HCL (PF) 0.5 % IJ SOLN
INTRAMUSCULAR | Status: DC | PRN
  Administered 2022-10-14: 30 mL

## 2022-10-14 MED ORDER — AMISULPRIDE (ANTIEMETIC) 5 MG/2ML IV SOLN: 10.0000 mg | Freq: Once | INTRAVENOUS | Status: DC | PRN

## 2022-10-14 MED ORDER — PROPOFOL 1000 MG/100ML IV EMUL
INTRAVENOUS | Status: AC
  Filled 2022-10-14: qty 100

## 2022-10-14 MED ORDER — LIDOCAINE HCL 2 % IJ SOLN
INTRAMUSCULAR | Status: AC
  Filled 2022-10-14: qty 20

## 2022-10-14 MED ORDER — PROPOFOL 10 MG/ML IV BOLUS
INTRAVENOUS | Status: DC | PRN
  Administered 2022-10-14: 50 mg via INTRAVENOUS

## 2022-10-14 SURGICAL SUPPLY — 36 items
BAG COUNTER SPONGE SURGICOUNT (BAG) ×1 IMPLANT
BAG SPNG CNTER NS LX DISP (BAG) ×1
BLADE LONG MED 31X9 (MISCELLANEOUS) IMPLANT
BNDG ADH 5X4 AIR PERM ELC (GAUZE/BANDAGES/DRESSINGS) ×1
BNDG CMPR 5X3 KNIT ELC UNQ LF (GAUZE/BANDAGES/DRESSINGS) ×1
BNDG CMPR 75X21 PLY HI ABS (MISCELLANEOUS) ×1
BNDG CMPR 9X4 STRL LF SNTH (GAUZE/BANDAGES/DRESSINGS) ×1
BNDG COHESIVE 4X5 WHT NS (GAUZE/BANDAGES/DRESSINGS) IMPLANT
BNDG ELASTIC 3INX 5YD STR LF (GAUZE/BANDAGES/DRESSINGS) ×1 IMPLANT
BNDG ESMARK 4X9 LF (GAUZE/BANDAGES/DRESSINGS) ×1 IMPLANT
BNDG GAUZE DERMACEA FLUFF 4 (GAUZE/BANDAGES/DRESSINGS) ×1 IMPLANT
BNDG GZE DERMACEA 4 6PLY (GAUZE/BANDAGES/DRESSINGS) ×1
COVER MAYO STAND STRL (DRAPES) IMPLANT
CUFF TOURN SGL QUICK 18X4 (TOURNIQUET CUFF) IMPLANT
DRSG EMULSION OIL 3X3 NADH (GAUZE/BANDAGES/DRESSINGS) ×1 IMPLANT
DURAPREP 26ML APPLICATOR (WOUND CARE) ×1 IMPLANT
ELECT REM PT RETURN 9FT ADLT (ELECTROSURGICAL) ×1
ELECTRODE REM PT RTRN 9FT ADLT (ELECTROSURGICAL) ×1 IMPLANT
GAUZE SPONGE 4X4 12PLY STRL (GAUZE/BANDAGES/DRESSINGS) ×1 IMPLANT
GAUZE STRETCH 2X75IN STRL (MISCELLANEOUS) ×1 IMPLANT
GLOVE BIO SURGEON STRL SZ8 (GLOVE) ×2 IMPLANT
GOWN STRL REUS W/ TWL LRG LVL3 (GOWN DISPOSABLE) ×1 IMPLANT
GOWN STRL REUS W/ TWL XL LVL3 (GOWN DISPOSABLE) ×1 IMPLANT
GOWN STRL REUS W/TWL LRG LVL3 (GOWN DISPOSABLE) ×1
GOWN STRL REUS W/TWL XL LVL3 (GOWN DISPOSABLE) ×1
KIT BASIN OR (CUSTOM PROCEDURE TRAY) ×1 IMPLANT
NDL HYPO 25X1 1.5 SAFETY (NEEDLE) ×1 IMPLANT
NEEDLE HYPO 25X1 1.5 SAFETY (NEEDLE) ×1 IMPLANT
NS IRRIG 1000ML POUR BTL (IV SOLUTION) IMPLANT
PACK ORTHO EXTREMITY (CUSTOM PROCEDURE TRAY) ×1 IMPLANT
SUCTION TUBE FRAZIER 10FR DISP (SUCTIONS) ×1 IMPLANT
SUT PROLENE 3 0 PS 2 (SUTURE) ×1 IMPLANT
SYR 10ML LL (SYRINGE) IMPLANT
TUBE CONNECTING 12X1/4 (SUCTIONS) ×1 IMPLANT
UNDERPAD 30X36 HEAVY ABSORB (UNDERPADS AND DIAPERS) ×1 IMPLANT
YANKAUER SUCT BULB TIP NO VENT (SUCTIONS) IMPLANT

## 2022-10-14 NOTE — Discharge Instructions (Signed)
See written instructions Resume all home medications as prior to surgery Continue doxycyline Vicodin for pain as needed Wear surgical shoe at all times, limit the amount of weightbearing

## 2022-10-14 NOTE — Transfer of Care (Addendum)
Immediate Anesthesia Transfer of Care Note  Patient: Pedro Lee  Procedure(s) Performed: PARTIAL AMPUTATION OF 2ND LEFT TOE (Left: Foot)  Patient Location: PACU  Anesthesia Type:MAC  Level of Consciousness: drowsy  Airway & Oxygen Therapy: Patient Spontanous Breathing  Post-op Assessment: Report given to RN and Post -op Vital signs reviewed and stable  Post vital signs: Reviewed and stable  Last Vitals:  Vitals Value Taken Time  BP 105/71 10/14/22 1645  Temp 36.4 C 10/14/22 1622  Pulse 60 10/14/22 1645  Resp 12 10/14/22 1645  SpO2 93 % 10/14/22 1645  Vitals shown include unfiled device data.  Last Pain:  Vitals:   10/14/22 1622  PainSc: 0-No pain      Patients Stated Pain Goal: 0 (10/14/22 1407)  Complications: No notable events documented.

## 2022-10-14 NOTE — H&P (Signed)
Anesthesia H&P Update: History and Physical Exam reviewed; patient is OK for planned anesthetic and procedure. ? ?

## 2022-10-14 NOTE — Brief Op Note (Signed)
10/14/2022  4:11 PM  PATIENT:  Pedro Lee  82 y.o. male  PRE-OPERATIVE DIAGNOSIS:  TOE ULCER WITH FAT LAYER EXPOSED OF 2ND LEFT TOE  POST-OPERATIVE DIAGNOSIS:  TOE ULCER WITH FAT LAYER EXPOSED OF 2ND LEFT TOE  PROCEDURE:  Procedure(s): PARTIAL AMPUTATION OF 2ND LEFT TOE (Left)  SURGEON:  Surgeons and Role:    * Vivi Barrack, DPM - Primary    * Barbaraann Share, DPM - Assisting  PHYSICIAN ASSISTANT:   ASSISTANTS: none   ANESTHESIA:   general  EBL:  minimal   BLOOD ADMINISTERED:none  DRAINS: none   LOCAL MEDICATIONS USED:  MARCAINE   , BUPIVICAINE , and Amount: 10 ml  SPECIMEN:  Source of Specimen:  toe for pathology  DISPOSITION OF SPECIMEN:  PATHOLOGY  COUNTS:  YES  TOURNIQUET:  * Missing tourniquet times found for documented tourniquets in log: [id number] *  DICTATION: .Dragon Dictation  PLAN OF CARE: Discharge to home after PACU  PATIENT DISPOSITION:  PACU - hemodynamically stable.   Delay start of Pharmacological VTE agent (>24hrs) due to surgical blood loss or risk of bleeding: no  Intraoperative findings: S/p partial left 2nd toe amputation at level of PIPJ. Remaining bone appeared viable. No pus or tracking. Closed without tension. WBAT (limited) and follow up next week.

## 2022-10-14 NOTE — Interval H&P Note (Signed)
History and Physical Interval Note:  10/14/2022 2:57 PM  Pedro Lee  has presented today for surgery, with the diagnosis of TOE ULCER WITH FAT LAYER EXPOSED OF 2ND LEFT TOE.  The various methods of treatment have been discussed with the patient and family. After consideration of risks, benefits and other options for treatment, the patient has consented to  Procedure(s): EXCISION PARTIAL PHALANX OF 2ND LEFT TOE (Left) as a surgical intervention.  The patient's history has been reviewed, patient examined, no change in status, stable for surgery.  I have reviewed the patient's chart and labs.  Questions were answered to the patient's satisfaction.     Vivi Barrack

## 2022-10-15 ENCOUNTER — Encounter (HOSPITAL_COMMUNITY): Payer: Self-pay | Admitting: Podiatry

## 2022-10-15 NOTE — Anesthesia Postprocedure Evaluation (Signed)
Anesthesia Post Note  Patient: Pedro Lee  Procedure(s) Performed: PARTIAL AMPUTATION OF 2ND LEFT TOE (Left: Foot)     Patient location during evaluation: PACU Anesthesia Type: MAC Level of consciousness: awake Pain management: pain level controlled Vital Signs Assessment: post-procedure vital signs reviewed and stable Respiratory status: spontaneous breathing, nonlabored ventilation and respiratory function stable Cardiovascular status: blood pressure returned to baseline and stable Postop Assessment: no apparent nausea or vomiting Anesthetic complications: no   No notable events documented.  Last Vitals:  Vitals:   10/14/22 1700 10/14/22 1715  BP: 115/75 112/75  Pulse: 63 (!) 59  Resp: 16 16  Temp:  36.4 C  SpO2: 92% 94%    Last Pain:  Vitals:   10/14/22 1622  PainSc: 0-No pain                 Britania Shreeve P Trixy Loyola

## 2022-10-16 LAB — SURGICAL PATHOLOGY

## 2022-10-20 ENCOUNTER — Ambulatory Visit (INDEPENDENT_AMBULATORY_CARE_PROVIDER_SITE_OTHER): Payer: Medicare PPO

## 2022-10-20 ENCOUNTER — Ambulatory Visit (INDEPENDENT_AMBULATORY_CARE_PROVIDER_SITE_OTHER): Payer: Medicare PPO | Admitting: Podiatry

## 2022-10-20 DIAGNOSIS — M778 Other enthesopathies, not elsewhere classified: Secondary | ICD-10-CM

## 2022-10-20 DIAGNOSIS — M86172 Other acute osteomyelitis, left ankle and foot: Secondary | ICD-10-CM

## 2022-10-20 DIAGNOSIS — M2042 Other hammer toe(s) (acquired), left foot: Secondary | ICD-10-CM

## 2022-10-20 NOTE — Progress Notes (Signed)
   No chief complaint on file.    Patient presents today status post partial left second toe amputation performed inpatient.  DOS: 10/14/2022.  He has been doing well and denies not report any fevers or chills.  No pain that he reports.  He did have some questions up to his ankle.  Previously was getting injections into his ankle and has a brace that he wears.  He followed up with orthopedics as well and offered surgery.  He is asking that in the office can be done.  Objective/Physical Exam General: AAO x3, NAD  Dermatological: Skin along the hallux is well-healed.  Incision well coapted on the second toe with sutures intact.  There is minimal edema but no erythema or warmth there is no drainage or pus.  There is no areas of fluctuation or crepitation.  There is no.  There are no open lesions or preulcerative lesions noted today.  Vascular: Dorsalis Pedis artery and Posterior Tibial artery pedal pulses are palpable bilateral with immedate capillary fill time. There is no pain with calf compression, swelling, warmth, erythema.   Neruologic: Sensation decreased.  Musculoskeletal: Hammertoes present.  Significant flatfoot is present.  He has tenderness along the sinus tarsi.  Assessment: 1. s/p partial left great toe amputation. DOS: 08/02/2022 2.  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 -Incision from this surgery on the second toe is healing well.  Xeroform was applied followed by dressing.  Discussed and keep dressing clean, dry, intact. -Discussed flexor tenotomy of the other digits.  Will likely plan on this in the near future to help prevent further ulcerations or infection -We discussed his ankle.  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  We discussed surgical invention versus conservative treatment.  We briefly discussed the postoperative course.  Likely continue with conservative care and  consider custom bracing.  X-rays were obtained reviewed.  3 views of the foot were obtained.  Status post partial first and second amputations.  No evidence of acute osteomyelitis.  Hammertoes are present.  Flatfoot noted.  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  Vivi Barrack DPM

## 2022-10-22 NOTE — Brief Op Note (Signed)
  10/14/2022   4:11 PM   PATIENT:  Pedro Lee  82 y.o. male   PRE-OPERATIVE DIAGNOSIS:  TOE ULCER WITH FAT LAYER EXPOSED OF 2ND LEFT TOE   POST-OPERATIVE DIAGNOSIS:  TOE ULCER WITH FAT LAYER EXPOSED OF 2ND LEFT TOE   PROCEDURE:  Procedure(s): PARTIAL AMPUTATION OF 2ND LEFT TOE (Left)   SURGEON:  Surgeons and Role:    * Vivi Barrack, DPM - Primary    * Barbaraann Share, DPM - Assisting   PHYSICIAN ASSISTANT:    ASSISTANTS: none    ANESTHESIA:   general   EBL:  minimal    BLOOD ADMINISTERED:none   DRAINS: none    LOCAL MEDICATIONS USED:  MARCAINE   , BUPIVICAINE , and Amount: 10 ml   SPECIMEN:  Source of Specimen:  toe for pathology   DISPOSITION OF SPECIMEN:  PATHOLOGY   COUNTS:  YES   TOURNIQUET:  * Missing tourniquet times found for documented tourniquets in log: [id number] *   DICTATION: .Dragon Dictation   PLAN OF CARE: Discharge to home after PACU   PATIENT DISPOSITION:  PACU - hemodynamically stable.   Delay start of Pharmacological VTE agent (>24hrs) due to surgical blood loss or risk of bleeding: no   Intraoperative findings: S/p partial left 2nd toe amputation at level of PIPJ. Remaining bone appeared viable. No pus or tracking. Closed without tension. WBAT (limited) and follow up next week.     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  He previous underwent hallux amputation which healed however developed a wound at the distal aspect of second toe and final osteomyelitis.  Alternatives, risks, complications were discussed.  No promises or guarantees given second the procedure and all questions answered the best my ability.  Procedure in detail: Patient was both verbally and visually identified by myself and nursing staff and anesthesia staff preoperatively.  He was then transferred the operating room via stretcher and placed on table in a supine  position.  Assessment was performed.  Mixture of lidocaine, Marcaine plain was infiltrated in a digital block fashion.  The left lower extremity and scrubbed, prepped, draped normal sterile fashion.  Secondary timeout is performed.  This time modified fishmouth incision was made along the second toe at the IPJ.  Incision was made with a 15 scalpel from skin to bone and the distal toe was disarticulated and passed off the table and sent to pathology.  Middle phalanx appeared to be somewhat nonviable remove this as well.  The proximal phalanx appeared to be viable.  Is hard in nature, white in color.  There is no purulence or proximal tracking.  I copiously irrigated the incision with saline hemostasis achieved.  The incision was closed with nylon.  Xeroform applied followed by dressing.  Awoken from anesthesia and found to try the procedure well any complications.

## 2022-10-22 NOTE — Op Note (Signed)
PATIENT:  Pedro Lee  82 y.o. male   PRE-OPERATIVE DIAGNOSIS:  TOE ULCER WITH FAT LAYER EXPOSED OF 2ND LEFT TOE   POST-OPERATIVE DIAGNOSIS:  TOE ULCER WITH FAT LAYER EXPOSED OF 2ND LEFT TOE   PROCEDURE:  Procedure(s): PARTIAL AMPUTATION OF 2ND LEFT TOE (Left)   SURGEON:  Surgeons and Role:    * Vivi Barrack, DPM - Primary    * Barbaraann Share, DPM - Assisting   PHYSICIAN ASSISTANT:    ASSISTANTS: none    ANESTHESIA:   general   EBL:  minimal    BLOOD ADMINISTERED:none   DRAINS: none    LOCAL MEDICATIONS USED:  MARCAINE   , BUPIVICAINE , and Amount: 10 ml   SPECIMEN:  Source of Specimen:  toe for pathology   DISPOSITION OF SPECIMEN:  PATHOLOGY   COUNTS:  YES   TOURNIQUET:  * Missing tourniquet times found for documented tourniquets in log: [id number] *   DICTATION: .Dragon Dictation   PLAN OF CARE: Discharge to home after PACU   PATIENT DISPOSITION:  PACU - hemodynamically stable.   Delay start of Pharmacological VTE agent (>24hrs) due to surgical blood loss or risk of bleeding: no   Intraoperative findings: S/p partial left 2nd toe amputation at level of PIPJ. Remaining bone appeared viable. No pus or tracking. Closed without tension. WBAT (limited) and follow up next week.       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  He previous underwent hallux amputation which healed however developed a wound at the distal aspect of second toe and final osteomyelitis.  Alternatives, risks, complications were discussed.  No promises or guarantees given second the procedure and all questions answered the best my ability.   Procedure in detail: Patient was both verbally and visually identified by myself and nursing staff and anesthesia staff preoperatively.  He was then transferred the operating room via stretcher and placed on table in a supine position.  Assessment was  performed.  Mixture of lidocaine, Marcaine plain was infiltrated in a digital block fashion.  The left lower extremity and scrubbed, prepped, draped normal sterile fashion.   Secondary timeout is performed.  This time modified fishmouth incision was made along the second toe at the IPJ.  Incision was made with a 15 scalpel from skin to bone and the distal toe was disarticulated and passed off the table and sent to pathology.  Middle phalanx appeared to be somewhat nonviable remove this as well.  The proximal phalanx appeared to be viable.  Is hard in nature, white in color.  There is no purulence or proximal tracking.  I copiously irrigated the incision with saline hemostasis achieved.  The incision was closed with nylon.   Xeroform applied followed by dressing.  Awoken from anesthesia and found to try the procedure well any complications.

## 2022-10-27 ENCOUNTER — Ambulatory Visit (INDEPENDENT_AMBULATORY_CARE_PROVIDER_SITE_OTHER): Payer: Medicare PPO | Admitting: Podiatry

## 2022-10-27 ENCOUNTER — Encounter (HOSPITAL_BASED_OUTPATIENT_CLINIC_OR_DEPARTMENT_OTHER): Payer: Self-pay | Admitting: Family

## 2022-10-27 ENCOUNTER — Ambulatory Visit (HOSPITAL_BASED_OUTPATIENT_CLINIC_OR_DEPARTMENT_OTHER): Payer: Medicare PPO | Admitting: Family

## 2022-10-27 VITALS — BP 130/66 | HR 70 | Ht 77.0 in | Wt 312.7 lb

## 2022-10-27 DIAGNOSIS — I10 Essential (primary) hypertension: Secondary | ICD-10-CM

## 2022-10-27 DIAGNOSIS — D6859 Other primary thrombophilia: Secondary | ICD-10-CM

## 2022-10-27 DIAGNOSIS — I4892 Unspecified atrial flutter: Secondary | ICD-10-CM

## 2022-10-27 DIAGNOSIS — E782 Mixed hyperlipidemia: Secondary | ICD-10-CM

## 2022-10-27 DIAGNOSIS — I701 Atherosclerosis of renal artery: Secondary | ICD-10-CM | POA: Diagnosis not present

## 2022-10-27 DIAGNOSIS — I5032 Chronic diastolic (congestive) heart failure: Secondary | ICD-10-CM | POA: Diagnosis not present

## 2022-10-27 DIAGNOSIS — M2042 Other hammer toe(s) (acquired), left foot: Secondary | ICD-10-CM

## 2022-10-27 DIAGNOSIS — M86172 Other acute osteomyelitis, left ankle and foot: Secondary | ICD-10-CM

## 2022-10-27 NOTE — Progress Notes (Signed)
ECHOCARDIOGRAM COMPLETE 09/18/2022  Narrative ECHOCARDIOGRAM REPORT    Patient Name:   Pedro Lee Date of Exam: 09/18/2022 Medical Rec #:  [id number]      Height:       77.0 in Accession #:    [id number]     Weight:       310.0 lb Date of Birth:  1940-09-27       BSA:          2.695 m Patient Age:    82  years       BP:           118/64 mmHg Patient Gender: M              HR:           80 bpm. Exam Location:  Outpatient  Procedure: 3D Echo, 2D Echo, Color Doppler and Cardiac Doppler  Indications:    R06.9 DOE; R60.0 Lower extremity edema; I48.2 Chronic atrial fibrillation  History:        Patient has prior history of Echocardiogram examinations, most recent 09/02/2021. CHF, Arrythmias:Atrial Fibrillation, Signs/Symptoms:Dyspnea and Edema; Risk Factors:Hypertension, Dyslipidemia, Sleep Apnea and Family History of Coronary Artery Disease. Patient denies chest pain. He does have DOE and leg edema.  Sonographer:    Pedro Lee RVT, RDCS (AE), RDMS Referring Phys: [phone] Avamarie Crossley S Fran Mcree  IMPRESSIONS   1. Left ventricular ejection fraction, by estimation, is 55 to 60%. The left ventricle has no regional wall motion abnormalities. There is moderate concentric left ventricular hypertrophy. Left ventricular diastolic parameters are indeterminate. 2. Right ventricular systolic function is low normal. The right ventricular size is mildly enlarged. 3. Left atrial size was moderately dilated. 4. Right atrial size was moderately dilated. 5. Mild mitral valve regurgitation. 6. Pulmonic valve regurgitation is moderate. 7. Adominial aorta measures 4.8 cm Recommend dedicated evaluation of aorta.  Comparison(s): EF 65%, moderate LVH, RVSP 40.5 mmHg, asc aor 41mm.  FINDINGS Left Ventricle: Left ventricular ejection fraction, by estimation, is 55 to 60%. The left ventricle has normal function. The left ventricle has no regional wall motion abnormalities. The left ventricular internal cavity size was normal in size. There is moderate concentric left ventricular hypertrophy. Left ventricular diastolic parameters are indeterminate.  Right Ventricle: The right ventricular size is mildly enlarged. Right vetricular wall thickness was not assessed. Right ventricular systolic function is low  normal.  Left Atrium: Left atrial size was moderately dilated.  Right Atrium: Right atrial size was moderately dilated.  Pericardium: There is no evidence of pericardial effusion.  Mitral Valve: There is mild thickening of the mitral valve leaflet(s). Mild mitral annular calcification. Mild mitral valve regurgitation.  Tricuspid Valve: The tricuspid valve is normal in structure. Tricuspid valve regurgitation is mild.  Aortic Valve: The aortic valve is tricuspid. Aortic valve regurgitation is not visualized. Aortic valve sclerosis/calcification is present, without any evidence of aortic stenosis. Aortic valve mean gradient measures 5.0 mmHg. Aortic valve peak gradient measures 8.9 mmHg. Aortic valve area, by VTI measures 4.08 cm.  Pulmonic Valve: The pulmonic valve was normal in structure. Pulmonic valve regurgitation is moderate.  Aorta: Adominial aorta measures 4.8 cm Recommend dedicated evaluation of aorta. The aortic root and ascending aorta are structurally normal, with no evidence of dilitation.  IAS/Shunts: No atrial level shunt detected by color flow Doppler.   LEFT VENTRICLE PLAX 2D LVIDd:         5.34 cm   Diastology LVIDs:  Cardiology Office Note:  .   Date:  10/27/2022  ID:  [name], DOB 1940-11-17, MRN [id number] PCP: Gwenyth Bender, MD  [facility] Providers Cardiologist:  Chilton Si, MD    History of Present Illness: .   Pedro Lee is a 82 y.o. 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   Prior [employer] 10/2019 negative for ischemia with normal LVEF.  Due to elevated BP doxazosin dose previously increased and he was referred to advanced hypertension clinic.   He lost his wife of 65 years May 2021 and has had understandable difficulty since that time.  ABIs 01/2020 due to leg pain within normal limits.  Symptoms thought to be due to venous insufficiency and provided as needed furosemide.  At visit 02/2020 hydralazine added for hypertension.  Repeat renal Dopplers 01/27/2021 with mild to moderate blockage bilaterally.  Amlodipine stopped due to lower extremity edema and hydralazine increased.  Doxazosin reduced due to hypotension. AAA duplex 01/2021 with no evidence of abdominal aortic aneurysm (largest measurement 2.9cm).   Spoke with pharmacist 05/22/2021 with BP 112/62.  He was no longer taking doxazosin.  He was recommended to reduce his carvedilol to half tablet.  Evening doxazosin was resumed as BP elevated in the morning after 148/85.   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 [name] initiated however had to later be discontinued due to UTI.   Seen 12 06/17/2021 with weight up 17 pounds in setting of not taking Lasix and dietary indiscretion, Lasix increased  to 40 mg daily for 3 days then return to as needed.   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    Clinic visit 08/25/22 new onset rate controlled atrial flutter. CBC, mag, thyroid panel, CMP unremarkable. Eliquis initiated. Carvedilol increased to 1.5 tablets BID.    Seen 09/08/22 in rate controlled atrial flutter. Wished to complete echo prior to DCCV. Echo 09/18/22 LVEF 55-60%, moderate LVH RVSF low normal, bilateral atria moderately dilated, mild MR, abdominal aorta 4.8cm. He was set up for CTA abd for further evaluation of AAA.   Underwent cardioversion 10/06/2022. 10/14/2022 underwent partial amputation of second left toe.  Presents today for follow up. Dyspnea, energy improved since DCCV. Still most bothered by arthritis. Wearing RLE compression stockings which has improved edema. Reports no shortness of breath nor dyspnea on exertion. Reports no chest pain, pressure, or tightness. No edema, orthopnea, PND. Reports no palpitations.    ROS: Please see the history of present illness.    All other systems reviewed and are negative.   Studies Reviewed: Marland Kitchen   EKG Interpretation Date/Time:  Tuesday October 27 2022 15:06:24 EDT Ventricular Rate:  63 PR Interval:  168 QRS Duration:  102 QT Interval:  394 QTC Calculation: 403 R Axis:   51  Text Interpretation: Normal sinus rhythm Stable lateral TWI. Maintaining NSR post cardioversion. Confirmed by Gillian Shields ([id number]) on 10/27/2022 3:07:55 PM    Cardiac Studies & Procedures     STRESS TESTS  MYOCARDIAL PERFUSION IMAGING 11/07/2019  Narrative  Nuclear stress EF: 60%.  The left ventricular ejection fraction is normal (55-65%).  There was no ST segment deviation noted during stress.  The study is normal.   ECHOCARDIOGRAM  ECHOCARDIOGRAM COMPLETE 09/18/2022  Narrative ECHOCARDIOGRAM REPORT    Patient Name:   Pedro Lee Date of Exam: 09/18/2022 Medical Rec #:  [id number]      Height:       77.0 in Accession #:    [id number]     Weight:       310.0 lb Date of Birth:  1940-09-27       BSA:          2.695 m Patient Age:    82  years       BP:           118/64 mmHg Patient Gender: M              HR:           80 bpm. Exam Location:  Outpatient  Procedure: 3D Echo, 2D Echo, Color Doppler and Cardiac Doppler  Indications:    R06.9 DOE; R60.0 Lower extremity edema; I48.2 Chronic atrial fibrillation  History:        Patient has prior history of Echocardiogram examinations, most recent 09/02/2021. CHF, Arrythmias:Atrial Fibrillation, Signs/Symptoms:Dyspnea and Edema; Risk Factors:Hypertension, Dyslipidemia, Sleep Apnea and Family History of Coronary Artery Disease. Patient denies chest pain. He does have DOE and leg edema.  Sonographer:    Pedro Lee RVT, RDCS (AE), RDMS Referring Phys: [phone] Avamarie Crossley S Fran Mcree  IMPRESSIONS   1. Left ventricular ejection fraction, by estimation, is 55 to 60%. The left ventricle has no regional wall motion abnormalities. There is moderate concentric left ventricular hypertrophy. Left ventricular diastolic parameters are indeterminate. 2. Right ventricular systolic function is low normal. The right ventricular size is mildly enlarged. 3. Left atrial size was moderately dilated. 4. Right atrial size was moderately dilated. 5. Mild mitral valve regurgitation. 6. Pulmonic valve regurgitation is moderate. 7. Adominial aorta measures 4.8 cm Recommend dedicated evaluation of aorta.  Comparison(s): EF 65%, moderate LVH, RVSP 40.5 mmHg, asc aor 41mm.  FINDINGS Left Ventricle: Left ventricular ejection fraction, by estimation, is 55 to 60%. The left ventricle has normal function. The left ventricle has no regional wall motion abnormalities. The left ventricular internal cavity size was normal in size. There is moderate concentric left ventricular hypertrophy. Left ventricular diastolic parameters are indeterminate.  Right Ventricle: The right ventricular size is mildly enlarged. Right vetricular wall thickness was not assessed. Right ventricular systolic function is low  normal.  Left Atrium: Left atrial size was moderately dilated.  Right Atrium: Right atrial size was moderately dilated.  Pericardium: There is no evidence of pericardial effusion.  Mitral Valve: There is mild thickening of the mitral valve leaflet(s). Mild mitral annular calcification. Mild mitral valve regurgitation.  Tricuspid Valve: The tricuspid valve is normal in structure. Tricuspid valve regurgitation is mild.  Aortic Valve: The aortic valve is tricuspid. Aortic valve regurgitation is not visualized. Aortic valve sclerosis/calcification is present, without any evidence of aortic stenosis. Aortic valve mean gradient measures 5.0 mmHg. Aortic valve peak gradient measures 8.9 mmHg. Aortic valve area, by VTI measures 4.08 cm.  Pulmonic Valve: The pulmonic valve was normal in structure. Pulmonic valve regurgitation is moderate.  Aorta: Adominial aorta measures 4.8 cm Recommend dedicated evaluation of aorta. The aortic root and ascending aorta are structurally normal, with no evidence of dilitation.  IAS/Shunts: No atrial level shunt detected by color flow Doppler.   LEFT VENTRICLE PLAX 2D LVIDd:         5.34 cm   Diastology LVIDs:  ECHOCARDIOGRAM COMPLETE 09/18/2022  Narrative ECHOCARDIOGRAM REPORT    Patient Name:   Pedro Lee Date of Exam: 09/18/2022 Medical Rec #:  [id number]      Height:       77.0 in Accession #:    [id number]     Weight:       310.0 lb Date of Birth:  1940-09-27       BSA:          2.695 m Patient Age:    82  years       BP:           118/64 mmHg Patient Gender: M              HR:           80 bpm. Exam Location:  Outpatient  Procedure: 3D Echo, 2D Echo, Color Doppler and Cardiac Doppler  Indications:    R06.9 DOE; R60.0 Lower extremity edema; I48.2 Chronic atrial fibrillation  History:        Patient has prior history of Echocardiogram examinations, most recent 09/02/2021. CHF, Arrythmias:Atrial Fibrillation, Signs/Symptoms:Dyspnea and Edema; Risk Factors:Hypertension, Dyslipidemia, Sleep Apnea and Family History of Coronary Artery Disease. Patient denies chest pain. He does have DOE and leg edema.  Sonographer:    Pedro Lee RVT, RDCS (AE), RDMS Referring Phys: [phone] Avamarie Crossley S Fran Mcree  IMPRESSIONS   1. Left ventricular ejection fraction, by estimation, is 55 to 60%. The left ventricle has no regional wall motion abnormalities. There is moderate concentric left ventricular hypertrophy. Left ventricular diastolic parameters are indeterminate. 2. Right ventricular systolic function is low normal. The right ventricular size is mildly enlarged. 3. Left atrial size was moderately dilated. 4. Right atrial size was moderately dilated. 5. Mild mitral valve regurgitation. 6. Pulmonic valve regurgitation is moderate. 7. Adominial aorta measures 4.8 cm Recommend dedicated evaluation of aorta.  Comparison(s): EF 65%, moderate LVH, RVSP 40.5 mmHg, asc aor 41mm.  FINDINGS Left Ventricle: Left ventricular ejection fraction, by estimation, is 55 to 60%. The left ventricle has normal function. The left ventricle has no regional wall motion abnormalities. The left ventricular internal cavity size was normal in size. There is moderate concentric left ventricular hypertrophy. Left ventricular diastolic parameters are indeterminate.  Right Ventricle: The right ventricular size is mildly enlarged. Right vetricular wall thickness was not assessed. Right ventricular systolic function is low  normal.  Left Atrium: Left atrial size was moderately dilated.  Right Atrium: Right atrial size was moderately dilated.  Pericardium: There is no evidence of pericardial effusion.  Mitral Valve: There is mild thickening of the mitral valve leaflet(s). Mild mitral annular calcification. Mild mitral valve regurgitation.  Tricuspid Valve: The tricuspid valve is normal in structure. Tricuspid valve regurgitation is mild.  Aortic Valve: The aortic valve is tricuspid. Aortic valve regurgitation is not visualized. Aortic valve sclerosis/calcification is present, without any evidence of aortic stenosis. Aortic valve mean gradient measures 5.0 mmHg. Aortic valve peak gradient measures 8.9 mmHg. Aortic valve area, by VTI measures 4.08 cm.  Pulmonic Valve: The pulmonic valve was normal in structure. Pulmonic valve regurgitation is moderate.  Aorta: Adominial aorta measures 4.8 cm Recommend dedicated evaluation of aorta. The aortic root and ascending aorta are structurally normal, with no evidence of dilitation.  IAS/Shunts: No atrial level shunt detected by color flow Doppler.   LEFT VENTRICLE PLAX 2D LVIDd:         5.34 cm   Diastology LVIDs:

## 2022-10-27 NOTE — Patient Instructions (Addendum)
Medication Instructions:  Continue your current medications.   *If you need a refill on your cardiac medications before your next appointment, please call your pharmacy*  Testing: Your EKG today shows you are staying in normal sinus rhythm after your cardioversion.   Your physician has requested that you have a renal artery duplex 01/2023 for monitoring of your renal artery stenosis. During this test, an ultrasound is used to evaluate blood flow to the kidneys. Allow one hour for this exam. Do not eat after midnight the day before and avoid carbonated beverages. Take your medications as you usually do.  Follow-Up: At [facility], you and your health needs are our priority.  As part of our continuing mission to provide you with exceptional heart care, we have created designated Provider Care Teams.  These Care Teams include your primary Cardiologist (physician) and Advanced Practice Providers (APPs -  Physician Assistants and Nurse Practitioners) who all work together to provide you with the care you need, when you need it.  We recommend signing up for the patient portal called "MyChart".  Sign up information is provided on this After Visit Summary.  MyChart is used to connect with patients for Virtual Visits (Telemedicine).  Patients are able to view lab/test results, encounter notes, upcoming appointments, etc.  Non-urgent messages can be sent to your provider as well.   To learn more about what you can do with MyChart, go to [email or web address].    Your next appointment:   6 month(s)  Provider:   Chilton Si, MD   Other Instructions  Recommend checking blood pressure 2-3 times per week. If routinely more than 130/80 after medications, please let us know.   Heart Healthy Diet Recommendations: A low-salt diet is recommended. Meats should be grilled, baked, or boiled. Avoid fried foods. Focus on lean protein sources like fish or chicken with vegetables and fruits. The  American Heart Association is a Chief [employer]!  American Heart Association Diet and Lifeystyle Recommendations   Exercise recommendations: The American Heart Association recommends 150 minutes of moderate intensity exercise weekly. Try 30 minutes of moderate intensity exercise 4-5 times per week. This could include walking, jogging, or swimming.

## 2022-10-29 ENCOUNTER — Encounter: Payer: Medicare PPO | Admitting: Podiatry

## 2022-10-30 ENCOUNTER — Encounter (HOSPITAL_BASED_OUTPATIENT_CLINIC_OR_DEPARTMENT_OTHER): Payer: Self-pay | Admitting: Family

## 2022-11-02 ENCOUNTER — Ambulatory Visit (HOSPITAL_COMMUNITY)
Admission: RE | Admit: 2022-11-02 | Payer: Medicare PPO | Source: Ambulatory Visit | Attending: Family | Admitting: Family

## 2022-11-02 ENCOUNTER — Encounter: Payer: Self-pay | Admitting: Podiatry

## 2022-11-02 ENCOUNTER — Ambulatory Visit (INDEPENDENT_AMBULATORY_CARE_PROVIDER_SITE_OTHER): Payer: Medicare PPO | Admitting: Podiatry

## 2022-11-02 DIAGNOSIS — B351 Tinea unguium: Secondary | ICD-10-CM

## 2022-11-02 DIAGNOSIS — N1831 Chronic kidney disease, stage 3a: Secondary | ICD-10-CM

## 2022-11-02 DIAGNOSIS — M79675 Pain in left toe(s): Secondary | ICD-10-CM | POA: Diagnosis not present

## 2022-11-02 DIAGNOSIS — M79674 Pain in right toe(s): Secondary | ICD-10-CM

## 2022-11-02 NOTE — Progress Notes (Signed)
This patient presents to the office with chief complaint of long thick painful nails.  Patient says the nails are painful walking and wearing shoes.  This patient is unable to self treat.  This patient is unable to trim his nails since she is unable to reach his nails. Patient has tried numerous fungal treatments unsuccessfully . He presents to the office for preventative foot care services.  He also has sutures noted second toe left foot.  General Appearance  Alert, conversant and in no acute stress.  Vascular  Dorsalis pedis and posterior tibial  pulses are palpable  bilaterally.  Capillary return is within normal limits  bilaterally. Temperature is within normal limits  bilaterally.  Neurologic  Senn-Weinstein monofilament wire test within normal limits  bilaterally. Muscle power within normal limits bilaterally.  Nails Thick disfigured discolored nails with subungual debris  from hallux to fifth toes bilaterally. No evidence of bacterial infection or drainage bilaterally.  Orthopedic  No limitations of motion  feet .  No crepitus or effusions noted.  No bony pathology or digital deformities noted.  Skin  normotropic skin with no porokeratosis noted bilaterally.  No signs of infections or ulcers noted.   Sutures intact.  No signs of infection.  Persistant swelling second toe left foot.  Onychomycosis  Nails  B/L.  Pain in right toes  Pain in left toes  Healing toe surgery.  Debridement of nails both feet  with nail nipper.  Remove sutures by Dr.  Burnell Blanks since he participated in his surgery. RTC 3 months.   Helane Gunther DPM

## 2022-11-07 NOTE — Progress Notes (Signed)
   No chief complaint on file.   Patient presents today status post partial left second toe amputation performed inpatient.  DOS: 10/14/2022.  He presents today for possible suture removal.  States he is doing well.  He is not endorsing fevers or chills.  He has no pain.  He has no new concerns.    Objective/Physical Exam General: AAO x3, NAD  Dermatological: Skin along the hallux is well-healed.  Incision well coapted on the second toe with sutures intact.  There are some slight edema present but there is no erythema or warmth.  No drainage or pus.  There is no fluctuation or crepitation.  There are no open lesions.  There are no new callus or open lesions noted the other digits at this time.  Vascular: Dorsalis Pedis artery and Posterior Tibial artery pedal pulses are palpable bilateral with immedate capillary fill time. There is no pain with calf compression, swelling, warmth, erythema.   Neruologic: Sensation decreased.  Musculoskeletal: Hammertoes present.  Significant flatfoot is present.  He has tenderness along the sinus tarsi.  Assessment: 1. s/p partial left great toe amputation. DOS: 08/02/2022 2.  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 -Sutures are not quite ready to come out.  Plan to remove these next week.  Small amount of antibiotic ointment and dressing was applied.  Discussed he can take the dressing at home.  Continue surgical shoe, offloading and limited weightbearing.  We discussed flexor tenotomy likely proceed with this in a couple of weeks as well please monitor closely for any skin breakdown to the other digits for any callus formation. -Monitor for any clinical signs or symptoms of infection and directed to call the office immediately should any occur or go to the ER.  No follow-ups on file.  Vivi Barrack DPM

## 2022-11-10 ENCOUNTER — Other Ambulatory Visit: Payer: Self-pay

## 2022-11-10 MED ORDER — HYDRALAZINE HCL 25 MG PO TABS: 25.0000 mg | ORAL_TABLET | Freq: Two times a day (BID) | ORAL | 3 refills | Status: AC

## 2022-11-12 ENCOUNTER — Encounter: Payer: Self-pay | Admitting: Podiatry

## 2022-11-12 ENCOUNTER — Ambulatory Visit (INDEPENDENT_AMBULATORY_CARE_PROVIDER_SITE_OTHER): Payer: Medicare PPO | Admitting: Podiatry

## 2022-11-12 VITALS — BP 146/90 | HR 72 | Temp 97.2°F | Resp 18 | Ht 77.0 in | Wt 312.0 lb

## 2022-11-12 DIAGNOSIS — M2042 Other hammer toe(s) (acquired), left foot: Secondary | ICD-10-CM

## 2022-11-12 DIAGNOSIS — M86172 Other acute osteomyelitis, left ankle and foot: Secondary | ICD-10-CM

## 2022-11-15 NOTE — Progress Notes (Signed)
   Chief Complaint  Patient presents with   Routine Post Op    POV # 3 DOS 10/14/22 -- LEFT PARTIAL SECOND TOE AMPUTATION      Patient presents today status post partial left second toe amputation performed inpatient.  DOS: 10/14/2022.  States he is doing well.  He may try 1 regular shoe.  He is scheduled next week to have flexor tenotomy's of the other toes.  Not see any new skin breakdown, callus formation or any pressure spots.  Does not endorse any fevers or chills.  No other concerns today.   Objective/Physical Exam General: AAO x3, NAD  Dermatological: Skin along the hallux is well-healed.  Incision along the second toe appears to be healing well.  There is still some edema present to the toe but is no erythema or warmth.  There is no drainage or pus.  There is no fluctuation or crepitation.  No malodor.  Digital contractures noted to the third and fourth toes, there is no skin breakdown, callus formation or ulcerations at this time.  Vascular: Dorsalis Pedis artery and Posterior Tibial artery pedal pulses are palpable bilateral with immedate capillary fill time. There is no pain with calf compression, swelling, warmth, erythema.   Neruologic: Sensation decreased.  Musculoskeletal: Hammertoes present.  Significant flatfoot is present.  He has tenderness along the sinus tarsi.  Assessment: 1. s/p partial left great toe amputation. DOS: 08/02/2022 2.  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 -Patient peers to be healing well from the second toe.  Discussed elevation to help with some residual swelling.  Monitoring signs or symptoms of infection.  I do think he will still benefit from flexor tenotomy's at the other digits.  He is scheduled next week for this.  Will plan on doing this on Wednesday. -Discussed the importance of daily foot inspection.  No follow-ups on file.  Vivi Barrack DPM

## 2022-11-16 ENCOUNTER — Encounter: Payer: Medicare PPO | Admitting: Podiatry

## 2022-11-18 ENCOUNTER — Ambulatory Visit (INDEPENDENT_AMBULATORY_CARE_PROVIDER_SITE_OTHER): Payer: Medicare PPO | Admitting: Podiatry

## 2022-11-18 DIAGNOSIS — Q666 Other congenital valgus deformities of feet: Secondary | ICD-10-CM

## 2022-11-18 DIAGNOSIS — M2042 Other hammer toe(s) (acquired), left foot: Secondary | ICD-10-CM | POA: Diagnosis not present

## 2022-11-18 DIAGNOSIS — M19072 Primary osteoarthritis, left ankle and foot: Secondary | ICD-10-CM

## 2022-11-18 DIAGNOSIS — R609 Edema, unspecified: Secondary | ICD-10-CM

## 2022-11-19 ENCOUNTER — Telehealth: Payer: Self-pay | Admitting: Podiatry

## 2022-11-19 NOTE — Telephone Encounter (Signed)
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 Right one is worse. Please call pt

## 2022-11-20 ENCOUNTER — Other Ambulatory Visit: Payer: Self-pay | Admitting: Podiatry

## 2022-11-20 DIAGNOSIS — Q666 Other congenital valgus deformities of feet: Secondary | ICD-10-CM

## 2022-11-20 DIAGNOSIS — M19071 Primary osteoarthritis, right ankle and foot: Secondary | ICD-10-CM

## 2022-11-20 DIAGNOSIS — M19072 Primary osteoarthritis, left ankle and foot: Secondary | ICD-10-CM

## 2022-11-20 NOTE — Telephone Encounter (Signed)
I called patient. He is going to pick up the new rx at the front desk on Monday.

## 2022-11-24 NOTE — Progress Notes (Signed)
Subjective: No chief complaint on file.  82 year old male presents the office today for flexor tenotomy's of his left third and fourth toes given hammertoe deformities.  He states the surgical sites are healing well and he has no concerns with this.  Is not had any skin breakdown.  Does not report any fevers or chills.  He has no other concerns.  Objective: AAO x3, NAD DP/PT pulses palpable bilaterally, CRT less than 3 seconds Incision from the prior surgeries on the partial left hallux, second cavitation healing well.  There is still some edema noted to the second toe there is no erythema or warmth.  No drainage or pus or any signs of infection.  Semirigid hammertoe contractures noted of the third and fourth toes of the left foot.  Patient has been hurting somewhat more since the amputations. No pain with calf compression, swelling, warmth, erythema  Assessment: Hammertoe deformities  Plan: -All treatment options discussed with the patient including all alternatives, risks, complications.  -Amputation sites appear to be healing well.  However given the digital fomites putting pressure and this is what caused the finger to be amputated we discussed options to try to help prevent this from happening on the third and fourth toes.  Discussed flexor tenotomy to help straighten the toe discussed this is not a guarantee.  We also discussed risk of the procedure including infection without less concern for further deformity or transfer lesions.  He wishes to proceed.  Consent was signed. -Skin is with alcohol mixture total of 5 mL of lidocaine, Marcaine plain was infiltrated to the toes and digital block fashion.  Once anesthetized I prepped with skin with Betadine.  I introduced an 18-gauge needle to the plantar aspect of the toe was able to transect the flexor tendon and the toe was sitting in a more rectus position.  Irrigated with saline.  Antibiotic ointment was applied followed by dressing.   Discussed with concern to change the dressing tomorrow.  Toe crest for splinting. -We again discussed his ankle.  He does not want proceed with surgery.  Discussed with him AFO brace. Prescription given.  -Monitor for any clinical signs or symptoms of infection and directed to call the office immediately should any occur or go to the ER. -Patient encouraged to call the office with any questions, concerns, change in symptoms.   Vivi Barrack DPM

## 2022-11-25 ENCOUNTER — Ambulatory Visit (HOSPITAL_COMMUNITY)
Admission: RE | Admit: 2022-11-25 | Discharge: 2022-11-25 | Disposition: A | Discharge status: Home / Self Care | Payer: Medicare PPO | Source: Ambulatory Visit | Attending: Podiatry | Admitting: Podiatry

## 2022-11-25 DIAGNOSIS — R609 Edema, unspecified: Secondary | ICD-10-CM | POA: Insufficient documentation

## 2022-11-28 ENCOUNTER — Other Ambulatory Visit: Payer: Self-pay | Admitting: Podiatry

## 2022-11-28 DIAGNOSIS — I872 Venous insufficiency (chronic) (peripheral): Secondary | ICD-10-CM

## 2022-11-30 ENCOUNTER — Ambulatory Visit: Payer: Medicare PPO | Admitting: Podiatry

## 2022-12-01 ENCOUNTER — Telehealth: Payer: Self-pay

## 2022-12-01 NOTE — Telephone Encounter (Signed)
Spoke to patient and discussed results of vein testing. Dr. Danella Penton office has already called him today. Patient is extremely grateful for all Dr. Ardelle Anton has done for him. He said to tell you thank you so much

## 2022-12-07 ENCOUNTER — Ambulatory Visit (INDEPENDENT_AMBULATORY_CARE_PROVIDER_SITE_OTHER): Payer: Medicare PPO | Admitting: Podiatry

## 2022-12-07 ENCOUNTER — Other Ambulatory Visit: Payer: Self-pay

## 2022-12-07 ENCOUNTER — Encounter: Payer: Self-pay | Admitting: Podiatry

## 2022-12-07 DIAGNOSIS — M2042 Other hammer toe(s) (acquired), left foot: Secondary | ICD-10-CM

## 2022-12-07 DIAGNOSIS — I10 Essential (primary) hypertension: Secondary | ICD-10-CM

## 2022-12-07 DIAGNOSIS — M19072 Primary osteoarthritis, left ankle and foot: Secondary | ICD-10-CM

## 2022-12-07 DIAGNOSIS — M19071 Primary osteoarthritis, right ankle and foot: Secondary | ICD-10-CM

## 2022-12-07 MED ORDER — AMLODIPINE BESYLATE 5 MG PO TABS: ORAL_TABLET | ORAL | 3 refills | Status: DC

## 2022-12-07 NOTE — Patient Instructions (Signed)

## 2022-12-13 NOTE — Progress Notes (Signed)
Subjective: Chief Complaint  Patient presents with   Routine Post Op    PATIENT THAT HIS FOOT IS GOOD AND PATIENT STATES THAT HE HAS NOT BEEN IN ANY PAIN     82 year old male presents the office today for flexor tenotomy's of his left third and fourth toes given hammertoe deformities.  States he is doing well.  Denies any open lesions and the toes are feeling good.  He has no concerns.   Objective: AAO x3, NAD DP/PT pulses palpable bilaterally, CRT less than 3 seconds All incisions from prior surgery well-healed.  There is no open lesions noted there is no callus formation present.  Does not warrant this.  There is no pain. No pain with calf compression, swelling, warmth, erythema  Assessment: Hammertoe deformities  Plan: -All treatment options discussed with the patient including all alternatives, risks, complications.  -Overall doing much better.  Continue daily foot inspection, offloading.  Continue monitor closely.  He did go to [facility] for bracing and will start with orthotics to see if this will help with the arthritis prior to bracing and surgery as a last resort.   Return in about 9 weeks (around 02/08/2023).  Vivi Barrack DPM

## 2023-01-13 NOTE — Progress Notes (Unsigned)
Office Note   Leg swelling  HPI: Pedro Lee is a 82 y.Pedro Lee. 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  Planned follow-up in 5 years.  Pedro Lee was referred again to our office for bilateral lower extremity edema. Pedro Lee is retired from [employer], where Pedro Lee worked for over 40 years.  Pedro Lee continues to be independent.  On exam today, Pedro Lee was doing well with no significant concerns.  Pedro Lee has appreciated bilateral lower extremity swelling for quite some times and says that this waxes and wanes.  Some days Pedro Lee does not appreciated at all.  Pedro Lee notes Pedro majority of swelling present by days end.  Pedro swelling is usually accompanied by heavy and tired feeling.  Pedro Lee wears compression stockings on a daily basis, which Pedro Lee says helps significantly.  Denies history of DVT Denies history of vascular or venous surgery.  Pedro Lee has no family history of aneurysmal disease, and is a non-smoker.  Pedro Lee is  on a statin for cholesterol management.  Pedro Lee is  on a daily aspirin.   Other AC:  - Pedro Lee is  on medication for hypertension.   Pedro Lee is not diabetic.  Tobacco hx:  none  Past Medical History:  Diagnosis Date   Acute on chronic diastolic heart failure (HCC) 12/22/2019   Allergy    sesonal   Complication of anesthesia    HX OF ENLARGED PROSTATE AND UNABLE TO PASS WATER AFTER ANESTHESIA   Dyspnea    with some exertion   Dysrhythmia    HX ATRIAL FIBRILLATION - HAD ABLATION - NO LONGER HAS AF.   Essential hypertension 12/22/2019   Essential hypertension, malignant    Exertional dyspnea 01/27/2021   Gout, unspecified    Lower extremity edema 12/22/2019   Obesity, mild    OSA (obstructive sleep apnea)    Mild - Lee STATES Pedro Lee DID NOT HAVE TO USE CPAP   Osteoarthritis    Osteoarthrosis, unspecified whether generalized or localized, unspecified site    Other dyspnea and respiratory abnormality    Prostate enlargement    DR. NESI IS Lee'S UROLOGIST    Pure hypercholesterolemia    Pure hypercholesterolemia 01/27/2021   Renal insufficiency, mild    Lee STATES NOT AWARE OF ANY KIDNEY PROBLEM    Past Surgical History:  Procedure Laterality Date   AMPUTATION TOE Left 08/02/2022   Procedure: LEFT GREAT TOE AMPUTATION;  Surgeon: Vivi Barrack, DPM;  Location: [facility] ORS;  Service: Podiatry;  Laterality: Left;   CARDIOVERSION N/A 10/06/2022   Procedure: CARDIOVERSION;  Surgeon: Chilton Si, MD;  Location: [facility] INVASIVE CV LAB;  Service: Cardiovascular;  Laterality: N/A;   COLONOSCOPY     CRANIOTOMY N/A 03/24/2016   Procedure: Transphenoidal Resection of Tumor;  Surgeon: Julio Sicks, MD;  Location: [facility] OR;  Service: Neurosurgery;  Laterality: N/A;  Transphenoidal Resection of Tumor   EXCISION PARTIAL PHALANX Left 10/14/2022   Procedure: PARTIAL AMPUTATION OF 2ND LEFT TOE;  Surgeon: Vivi Barrack, DPM;  Location: [facility] OR;  Service: Orthopedics/Podiatry;  Laterality: Left;   HEART ABLATION FOR AF  1998   JOINT REPLACEMENT     LEFT TOTAL KNEE REPLACEMENT   KNEE ARTHROPLASTY Left 2013ish   LEFT KNEE ARTHROSCOPY     TOTAL KNEE ARTHROPLASTY Right 05/30/2013   Procedure: RIGHT TOTAL KNEE ARTHROPLASTY;  Surgeon: Shelda Pal, MD;  Location: [facility];  Service: Orthopedics;  Laterality: Right;   TRANSNASAL APPROACH N/A  03/24/2016   Procedure: TRANSNASAL APPROACH;  Surgeon: Drema Halon, MD;  Location: [facility] OR;  Service: ENT;  Laterality: N/A;  TRANSNASAL APPROACH    Social History   Socioeconomic History   Marital status: Widowed    Spouse name: Not on file   Number of children: Not on file   Years of education: Not on file   Highest education level: Not on file  Occupational History   Not on file  Tobacco Use   Smoking status: Never   Smokeless tobacco: Never  Vaping Use   Vaping status: Never Used  Substance and Sexual Activity   Alcohol use: No    Alcohol/week: 0.0 standard drinks of alcohol   Drug use: No   Sexual activity:  Not on file  Other Topics Concern   Not on file  Social History Narrative   Not on file   Social Determinants of Health   Financial Resource Strain: Low Risk  (05/06/2020)   Overall Financial Resource Strain (CARDIA)    Difficulty of Paying Living Expenses: Not hard at all  Food Insecurity: No Food Insecurity (05/06/2020)   Hunger Vital Sign    Worried About Running Out of Food in Pedro Last Year: Never true    Ran Out of Food in Pedro Last Year: Never true  Transportation Needs: No Transportation Needs (05/06/2020)   PRAPARE - Administrator, Civil Service (Medical): No    Lack of Transportation (Non-Medical): No  Physical Activity: Inactive (05/06/2020)   Exercise Vital Sign    Days of Exercise per Week: 0 days    Minutes of Exercise per Session: 0 min  Stress: No Stress Concern Present (05/06/2020)   [employer] of Occupational Health - Occupational Stress Questionnaire    Feeling of Stress : Only a little  Social Connections: Unknown (06/23/2021)   Received from [facility]   Social Network    Social Network: Not on file  Intimate Partner Violence: Unknown (05/15/2021)   Received from [facility], [facility]   HITS    Physically Hurt: Not on file    Insult or Talk Down To: Not on file    Threaten Physical Harm: Not on file    Scream or Curse: Not on file    Family History  Problem Relation Age of Onset   Heart disease Father 47    Current Outpatient Medications  Medication Sig Dispense Refill   allopurinol (ZYLOPRIM) 300 MG tablet Take 300 mg by mouth daily.     amLODipine (NORVASC) 5 MG tablet TAKE 1 TABLET(5 MG) BY MOUTH AT BEDTIME 90 tablet 3   apixaban (ELIQUIS) 5 MG TABS tablet Take 1 tablet (5 mg total) by mouth 2 (two) times daily. 60 tablet 11   carvedilol (COREG) 12.5 MG tablet Take 1.5 tablets (18.75 mg total) by mouth 2 (two) times daily with a meal. 135 tablet 3   colchicine 0.6 MG tablet Take 0.6 mg by mouth at bedtime.      doxazosin (CARDURA) 2 MG tablet Take 1 tablet (2 mg total) by mouth at bedtime. Please take one 2mg  tablet daily in Pedro evening 90 tablet 3   fluticasone (FLONASE) 50 MCG/ACT nasal spray Place 2 sprays into both nostrils daily as needed for allergies or rhinitis.     furosemide (LASIX) 40 MG tablet TAKE 1 TABLET BY MOUTH DAILY AS NEEDED FOR SWELLING (Patient taking differently: Take 40 mg by mouth daily as needed for fluid or edema.) 90  tablet 3   hydrALAZINE (APRESOLINE) 25 MG tablet Take 1 tablet (25 mg total) by mouth in Pedro morning and at bedtime. 180 tablet 3   HYDROcodone-acetaminophen (NORCO/VICODIN) 5-325 MG tablet Take 1-2 tablets by mouth every 6 (six) hours as needed. 15 tablet 0   montelukast (SINGULAIR) 10 MG tablet Take 10 mg by mouth daily.     Polyethyl Glycol-Propyl Glycol (SYSTANE) 0.4-0.3 % SOLN Place 1 drop into both eyes as needed (dry eyes).     rosuvastatin (CRESTOR) 20 MG tablet TAKE 1 TABLET(20 MG) BY MOUTH DAILY (Patient taking differently: Take 20 mg by mouth daily.) 90 tablet 3   tamsulosin (FLOMAX) 0.4 MG CAPS capsule Take 0.4 mg by mouth daily after supper.     No current facility-administered medications for this visit.    Allergies  Allergen Reactions   Penicillins Rash     REVIEW OF SYSTEMS:   [X]  denotes positive finding, [ ]  denotes negative finding Cardiac  Comments:  Chest pain or chest pressure:    Shortness of breath upon exertion:    Short of breath when lying flat:    Irregular heart rhythm:        Vascular    Pain in calf, thigh, or hip brought on by ambulation:    Pain in feet at night that wakes you up from your sleep:     Blood clot in your veins:    Leg swelling:         Pulmonary    Oxygen at home:    Productive cough:     Wheezing:         Neurologic    Sudden weakness in arms or legs:     Sudden numbness in arms or legs:     Sudden onset of difficulty speaking or slurred speech:    Temporary loss of vision in one eye:      Problems with dizziness:         Gastrointestinal    Blood in stool:     Vomited blood:         Genitourinary    Burning when urinating:     Blood in urine:        Psychiatric    Major depression:         Hematologic    Bleeding problems:    Problems with blood clotting too easily:        Skin    Rashes or ulcers:        Constitutional    Fever or chills:      PHYSICAL EXAMINATION:  There were no vitals filed for this visit.  General:  WDWN in NAD; vital signs documented above Gait: Not observed HENT: WNL, normocephalic Pulmonary: normal non-labored breathing , without wheezing Cardiac: regular HR, Abdomen: soft, NT, no masses Skin: without rashes Vascular Exam/Pulses:  Right Left                  DP 2+ (normal) 2+ (normal)  Lee     Extremities: without ischemic changes, without Gangrene , without cellulitis; without open wounds;  Musculoskeletal: no muscle wasting or atrophy  Neurologic: A&Pedro Lee X 3;  No focal weakness or paresthesias are detected Psychiatric:  Pedro Lee has Normal affect.   Non-Invasive Vascular Imaging:     Venous Reflux Times  +--------------+---------+------+-----------+------------+--------+  RIGHT        Reflux NoRefluxReflux TimeDiameter cmsComments  Yes                                   +--------------+---------+------+-----------+------------+--------+  CFV                    yes   >1 second                       +--------------+---------+------+-----------+------------+--------+  FV mid                  yes   >1 second                       +--------------+---------+------+-----------+------------+--------+  Popliteal              yes   >1 second                       +--------------+---------+------+-----------+------------+--------+  GSV at SFJ              yes    >500 ms      0.68              +--------------+---------+------+-----------+------------+--------+   GSV prox thigh          yes    >500 ms      0.42              +--------------+---------+------+-----------+------------+--------+  GSV mid thigh no                            0.38              +--------------+---------+------+-----------+------------+--------+  GSV dist thighno                            0.43              +--------------+---------+------+-----------+------------+--------+  GSV at knee   no                            0.40              +--------------+---------+------+-----------+------------+--------+  GSV prox calf           yes    >500 ms      0.34              +--------------+---------+------+-----------+------------+--------+  GSV mid calf  no                            0.43              +--------------+---------+------+-----------+------------+--------+  SSV Pop Fossa no                            0.29              +--------------+---------+------+-----------+------------+--------+  SSV prox calf           yes    >500 ms      0.33              +--------------+---------+------+-----------+------------+--------+  SSV mid calf  no  0.32              +--------------+---------+------+-----------+------------+--------+  AASV Pedro Lee                  yes    >500 ms      0.43              +--------------+---------+------+-----------+------------+--------+  AASV P        no                            0.44              +--------------+---------+------+-----------+------------+--------+  AASV M        no                            0.29              +--------------+---------+------+-----------+------------+--------+     +--------------+---------+------+-----------+------------+--------+  LEFT         Reflux NoRefluxReflux TimeDiameter cmsComments                          Yes                                    +--------------+---------+------+-----------+------------+--------+  CFV                    yes   >1 second                       +--------------+---------+------+-----------+------------+--------+  FV mid                  yes   >1 second                       +--------------+---------+------+-----------+------------+--------+  Popliteal    no                                              +--------------+---------+------+-----------+------------+--------+  GSV at SFJ              yes    >500 ms      0.92              +--------------+---------+------+-----------+------------+--------+  GSV prox thighno                            0.66              +--------------+---------+------+-----------+------------+--------+  GSV mid thigh no                            0.53              +--------------+---------+------+-----------+------------+--------+  GSV dist thighno                            0.47              +--------------+---------+------+-----------+------------+--------+  GSV at knee   no  0.49              +--------------+---------+------+-----------+------------+--------+  GSV prox calf no                            0.38              +--------------+---------+------+-----------+------------+--------+  GSV mid calf            yes    >500 ms      0.45              +--------------+---------+------+-----------+------------+--------+  SSV Pop Fossa no                            0.30              +--------------+---------+------+-----------+------------+--------+  SSV prox calf no                            0.32              +--------------+---------+------+-----------+------------+--------+  SSV mid calf  no                            0.37              +--------------+---------+------+-----------+------------+--------+  AASV Pedro Lee                  yes    >500 ms      0.39               +--------------+---------+------+-----------+------------+--------+  AASV P        no                            0.29              +--------------+---------+------+-----------+------------+--------+  AASV M        no                            0.29              +--------------+---------+------+-----------+------------+--------+    ASSESSMENT/PLAN: FARID BERGUM is a 82 y.Pedro Lee. male presenting with symptoms consistent with chronic venous insufficiency.  On exam, Pedro Lee is a large man - 6 5, 308.  Lower extremities demonstrated mild edema.  CEAP 3. Bilateral lower extremity venous reflux studies demonstrated venous insufficiency in both Pedro superficial and deep systems bilaterally. Fortunately, Pedro Lee states lower extremity compression helps significantly.  At his age and with compression providing significant benefit, I do not think there is a reason to pursue saphenous vein ablation at this time.  Pedro Lee had a long discussion regarding Pedro above.  Pedro Lee felt comfortable following up as needed.  Lee asked him to call should his leg pain and heaviness worsen while continuing to wear compression stockings as this would be an indication for intervention.  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  This is likely just due to his large size, however this is classified as ectasia.  My plan is to follow this up with another aortic duplex ultrasound study at Pedro 5-year mark, as noted at his last visit.  Patient currently on best medical therapy which includes high intensity statin, as  well as aspirin.   Victorino Sparrow, MD [facility] [phone]

## 2023-01-14 ENCOUNTER — Encounter: Payer: Self-pay | Admitting: Vascular Surgery

## 2023-01-14 ENCOUNTER — Ambulatory Visit: Payer: Medicare PPO | Admitting: Vascular Surgery

## 2023-01-14 VITALS — BP 169/78 | HR 78 | Temp 98.1°F | Resp 20 | Ht 77.0 in | Wt 308.0 lb

## 2023-01-14 DIAGNOSIS — I872 Venous insufficiency (chronic) (peripheral): Secondary | ICD-10-CM | POA: Diagnosis not present

## 2023-01-28 ENCOUNTER — Ambulatory Visit (HOSPITAL_COMMUNITY)
Admission: RE | Admit: 2023-01-28 | Discharge: 2023-01-28 | Disposition: A | Discharge status: Home / Self Care | Payer: Medicare PPO | Source: Ambulatory Visit | Attending: Family | Admitting: Family

## 2023-01-28 DIAGNOSIS — I701 Atherosclerosis of renal artery: Secondary | ICD-10-CM

## 2023-02-01 ENCOUNTER — Telehealth (HOSPITAL_BASED_OUTPATIENT_CLINIC_OR_DEPARTMENT_OTHER): Payer: Self-pay

## 2023-02-01 NOTE — Telephone Encounter (Signed)
Left message for patient to call back  

## 2023-02-01 NOTE — Telephone Encounter (Signed)
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   -Please educate Pedro Lee to contact us if he is having abdominal pain after eating as would be a sign of symptomatic mesenteric artery stenosis.   -Ensure taking rosuvastatin 20 mg daily.  -Recommend updating fasting lipid panel 5-7 days prior to upcoming March appointment.

## 2023-02-02 ENCOUNTER — Telehealth (HOSPITAL_BASED_OUTPATIENT_CLINIC_OR_DEPARTMENT_OTHER): Payer: Self-pay

## 2023-02-02 NOTE — Telephone Encounter (Signed)
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   -Please educate Pedro Lee to contact us if he is having abdominal pain after eating as would be a sign of symptomatic mesenteric artery stenosis.   -Ensure taking rosuvastatin 20 mg daily.  -Recommend updating fasting lipid panel 5-7 days prior to upcoming March appointment.

## 2023-02-02 NOTE — Telephone Encounter (Signed)
Left message for patient to call back  

## 2023-02-05 ENCOUNTER — Telehealth (HOSPITAL_BASED_OUTPATIENT_CLINIC_OR_DEPARTMENT_OTHER): Payer: Self-pay

## 2023-02-05 NOTE — Telephone Encounter (Signed)
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   -Please educate Pedro Lee to contact us if he is having abdominal pain after eating as would be a sign of symptomatic mesenteric artery stenosis.   -Ensure taking rosuvastatin 20 mg daily.  -Recommend updating fasting lipid panel 5-7 days prior to upcoming March appointment.

## 2023-02-05 NOTE — Telephone Encounter (Signed)
Called and left message for patient to call back.

## 2023-02-09 ENCOUNTER — Telehealth (HOSPITAL_BASED_OUTPATIENT_CLINIC_OR_DEPARTMENT_OTHER): Payer: Self-pay

## 2023-02-09 DIAGNOSIS — E782 Mixed hyperlipidemia: Secondary | ICD-10-CM

## 2023-02-09 DIAGNOSIS — I701 Atherosclerosis of renal artery: Secondary | ICD-10-CM

## 2023-02-09 NOTE — Telephone Encounter (Signed)
Spoke to patient regarding renal US results. He verbalized understanding and is in agreement with plan. Will order fasting labs and mail to patient.

## 2023-02-26 DIAGNOSIS — N401 Enlarged prostate with lower urinary tract symptoms: Secondary | ICD-10-CM | POA: Diagnosis not present

## 2023-02-26 DIAGNOSIS — N138 Other obstructive and reflux uropathy: Secondary | ICD-10-CM | POA: Diagnosis not present

## 2023-02-26 DIAGNOSIS — R350 Frequency of micturition: Secondary | ICD-10-CM | POA: Diagnosis not present

## 2023-03-09 DIAGNOSIS — I719 Aortic aneurysm of unspecified site, without rupture: Secondary | ICD-10-CM | POA: Diagnosis not present

## 2023-03-09 DIAGNOSIS — I4891 Unspecified atrial fibrillation: Secondary | ICD-10-CM | POA: Diagnosis not present

## 2023-03-09 DIAGNOSIS — M1990 Unspecified osteoarthritis, unspecified site: Secondary | ICD-10-CM | POA: Diagnosis not present

## 2023-03-09 DIAGNOSIS — F0390 Unspecified dementia without behavioral disturbance: Secondary | ICD-10-CM | POA: Diagnosis not present

## 2023-03-09 DIAGNOSIS — D6869 Other thrombophilia: Secondary | ICD-10-CM | POA: Diagnosis not present

## 2023-03-09 DIAGNOSIS — N1832 Chronic kidney disease, stage 3b: Secondary | ICD-10-CM | POA: Diagnosis not present

## 2023-03-09 DIAGNOSIS — I739 Peripheral vascular disease, unspecified: Secondary | ICD-10-CM | POA: Diagnosis not present

## 2023-03-09 DIAGNOSIS — E785 Hyperlipidemia, unspecified: Secondary | ICD-10-CM | POA: Diagnosis not present

## 2023-03-17 DIAGNOSIS — Z961 Presence of intraocular lens: Secondary | ICD-10-CM | POA: Diagnosis not present

## 2023-03-17 DIAGNOSIS — H11823 Conjunctivochalasis, bilateral: Secondary | ICD-10-CM | POA: Diagnosis not present

## 2023-03-17 DIAGNOSIS — H02403 Unspecified ptosis of bilateral eyelids: Secondary | ICD-10-CM | POA: Diagnosis not present

## 2023-03-19 ENCOUNTER — Ambulatory Visit: Payer: Medicare PPO | Admitting: Podiatry

## 2023-03-19 ENCOUNTER — Encounter: Payer: Self-pay | Admitting: Podiatry

## 2023-03-19 ENCOUNTER — Ambulatory Visit: Payer: Medicare PPO

## 2023-03-19 DIAGNOSIS — L748 Other eccrine sweat disorders: Secondary | ICD-10-CM | POA: Diagnosis not present

## 2023-03-19 DIAGNOSIS — L089 Local infection of the skin and subcutaneous tissue, unspecified: Secondary | ICD-10-CM

## 2023-03-19 NOTE — Progress Notes (Signed)
 Subjective: Chief Complaint  Patient presents with   Wound Check    RM#11 Patient concerned about foot odor of infection no signs of infection at this time right foot looks to be heeled.   83 year old male presents the office today for concerns of possible infection to his feet.  He states he previously noticing odor coming from his feet.  A pedicure yesterday and did not notice the odor.  He does not report any open lesions or any areas of drainage.  No injuries.   Objective: AAO x3, NAD DP/PT pulses palpable bilaterally, CRT less than 3 seconds Sensation decreased Digital form is noted. Unable to appreciate any area of ulceration there is no areas of drainage.  There is no increase in temperature or warmth of the fingers no obvious signs of infection.  There is very slight macerated tissue on the fourth interspace bilaterally but otherwise there is no signs of infection. No pain with calf compression, swelling, warmth, erythema  Assessment: 83 year old male with foot odor  Plan: -All treatment options discussed with the patient including all alternatives, risks, complications.  -Unable to appreciate any signs of infection.  There is very minimal maceration interdigital in the fourth interspaces bilaterally.  Discussed drying thoroughly between the toes.  Continue changing shoes and socks regularly.  Discussed importance of daily foot inspection. -Patient encouraged to call the office with any questions, concerns, change in symptoms.   Pedro Lee DPM

## 2023-03-25 DIAGNOSIS — R350 Frequency of micturition: Secondary | ICD-10-CM | POA: Diagnosis not present

## 2023-03-25 DIAGNOSIS — N401 Enlarged prostate with lower urinary tract symptoms: Secondary | ICD-10-CM | POA: Diagnosis not present

## 2023-03-25 DIAGNOSIS — N138 Other obstructive and reflux uropathy: Secondary | ICD-10-CM | POA: Diagnosis not present

## 2023-04-13 DIAGNOSIS — H02403 Unspecified ptosis of bilateral eyelids: Secondary | ICD-10-CM | POA: Diagnosis not present

## 2023-04-19 DIAGNOSIS — R7303 Prediabetes: Secondary | ICD-10-CM | POA: Diagnosis not present

## 2023-04-19 DIAGNOSIS — I951 Orthostatic hypotension: Secondary | ICD-10-CM | POA: Diagnosis not present

## 2023-04-19 DIAGNOSIS — R600 Localized edema: Secondary | ICD-10-CM | POA: Diagnosis not present

## 2023-04-19 DIAGNOSIS — I69911 Memory deficit following unspecified cerebrovascular disease: Secondary | ICD-10-CM | POA: Diagnosis not present

## 2023-04-19 DIAGNOSIS — I872 Venous insufficiency (chronic) (peripheral): Secondary | ICD-10-CM | POA: Diagnosis not present

## 2023-04-19 DIAGNOSIS — M2550 Pain in unspecified joint: Secondary | ICD-10-CM | POA: Diagnosis not present

## 2023-04-19 DIAGNOSIS — N1831 Chronic kidney disease, stage 3a: Secondary | ICD-10-CM | POA: Diagnosis not present

## 2023-04-19 DIAGNOSIS — G542 Cervical root disorders, not elsewhere classified: Secondary | ICD-10-CM | POA: Diagnosis not present

## 2023-04-19 DIAGNOSIS — M109 Gout, unspecified: Secondary | ICD-10-CM | POA: Diagnosis not present

## 2023-04-19 DIAGNOSIS — J301 Allergic rhinitis due to pollen: Secondary | ICD-10-CM | POA: Diagnosis not present

## 2023-04-19 DIAGNOSIS — G4733 Obstructive sleep apnea (adult) (pediatric): Secondary | ICD-10-CM | POA: Diagnosis not present

## 2023-04-19 DIAGNOSIS — H65197 Other acute nonsuppurative otitis media recurrent, unspecified ear: Secondary | ICD-10-CM | POA: Diagnosis not present

## 2023-04-20 DIAGNOSIS — E782 Mixed hyperlipidemia: Secondary | ICD-10-CM | POA: Diagnosis not present

## 2023-04-20 DIAGNOSIS — I701 Atherosclerosis of renal artery: Secondary | ICD-10-CM | POA: Diagnosis not present

## 2023-04-21 LAB — LIPID PANEL
Chol/HDL Ratio: 4.1 ratio (ref 0.0–5.0)
Cholesterol, Total: 165 mg/dL (ref 100–199)
HDL: 40 mg/dL (ref >39)
LDL Chol Calc (NIH): 95 mg/dL (ref 0–99)
Triglycerides: 171 mg/dL — ABNORMAL HIGH (ref 0–149)
VLDL Cholesterol Cal: 30 mg/dL (ref 5–40)

## 2023-04-21 IMAGING — DX DG CERVICAL SPINE COMPLETE 4+V
5 series · 5 of 5 positions shown · non-contrast
Comparison: None

CLINICAL DATA: LEFT-sided neck pain on and off for several years
without history of injury.

EXAM:
CERVICAL SPINE - COMPLETE 4+ VIEW

[dg cervical spine complete (1 of 5)]
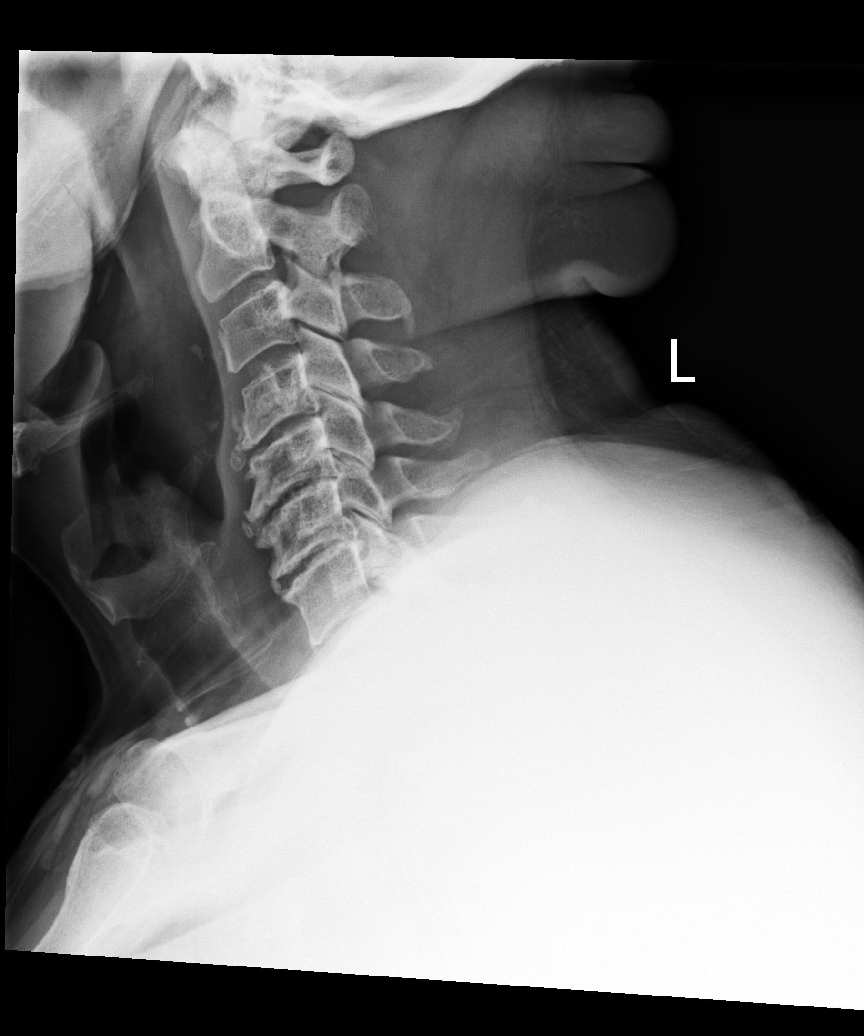

[dg cervical spine complete (2 of 5)]
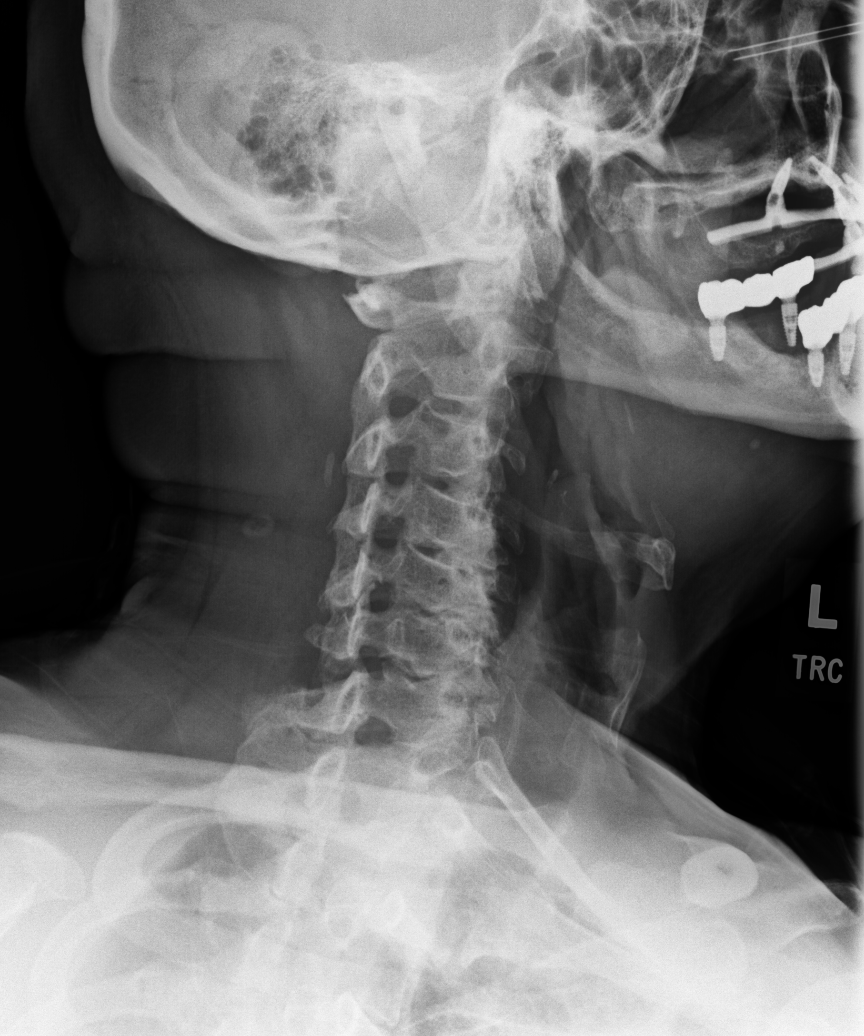

[dg cervical spine complete (3 of 5)]
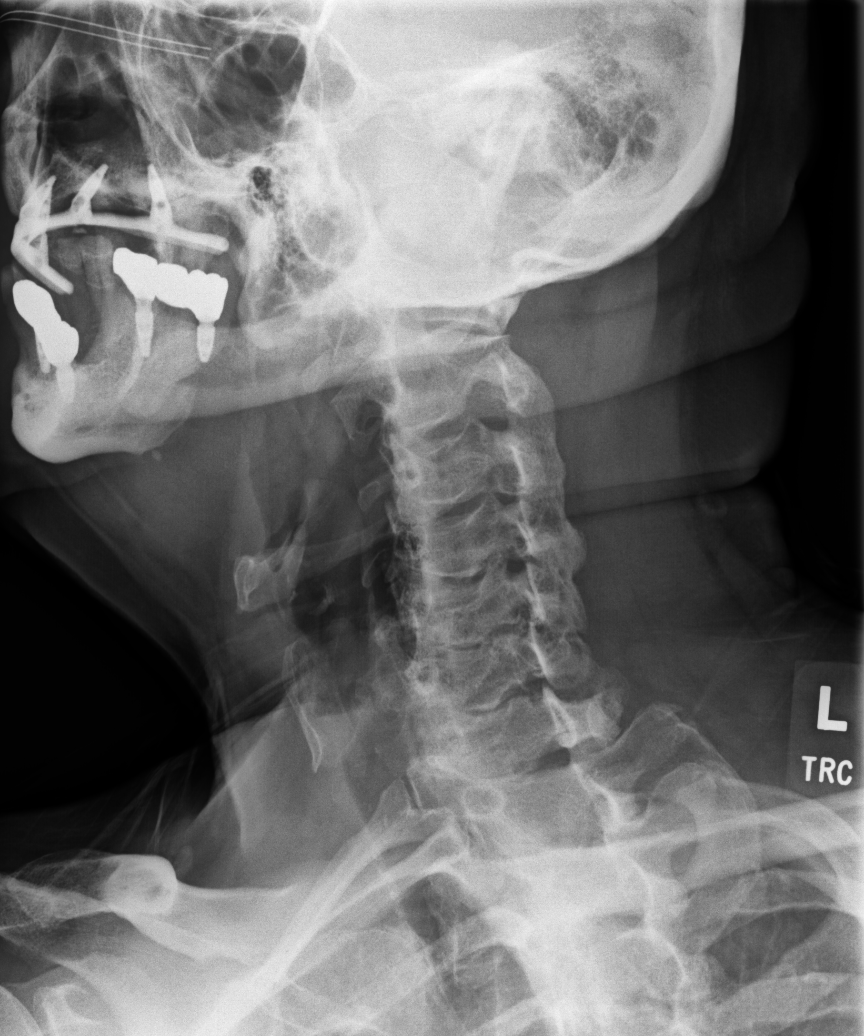

[dg cervical spine complete (4 of 5)]
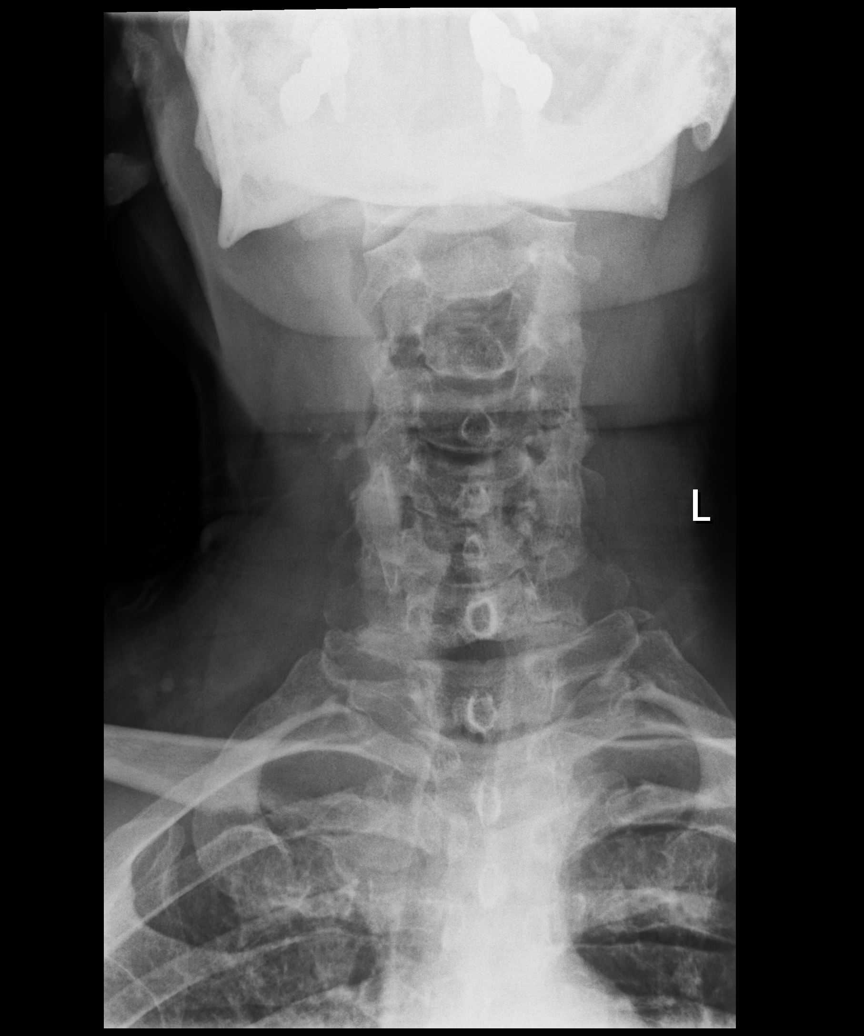

[dg cervical spine complete (5 of 5)]
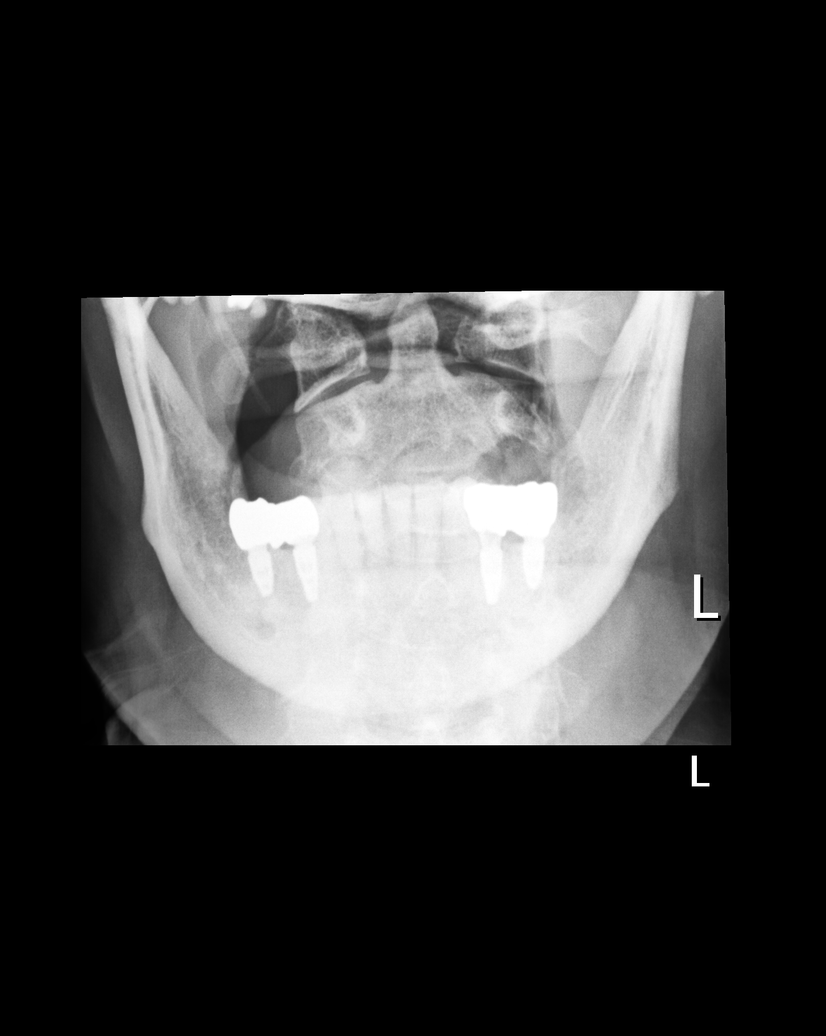

[5 of 5 positions shown; findings below may reference images not displayed]

FINDINGS: Lateral view with imaging through the inferior most aspect of C7
only. C7-T1 not well evaluated. Degenerative changes are noted
throughout the cervical spine with straightening of normal spinal
lordotic curvature. Degenerative changes most pronounced at
C[DATE]-C5-6 and C6-7 with moderate anterior osteophytes and
uncovertebral spurring.

No substantial RIGHT-sided neural foraminal narrowing.

LEFT-sided neural foraminal narrowing is suspected but obliquity
limits assessment this is is greatest at C5-6 and C6-7.

No acute bone process or signs of static subluxation.
IMPRESSION: Degenerative changes in the cervical spine greatest at C[DATE]-C5 and
C6-7.

No acute findings, lateral view with assessment through C7 only.

LEFT-sided neural foraminal narrowing is suspected, greatest at C5-6
and C6-7.

## 2023-04-22 ENCOUNTER — Telehealth (HOSPITAL_BASED_OUTPATIENT_CLINIC_OR_DEPARTMENT_OTHER): Payer: Self-pay

## 2023-04-22 DIAGNOSIS — E782 Mixed hyperlipidemia: Secondary | ICD-10-CM

## 2023-04-22 NOTE — Telephone Encounter (Addendum)
 Left message for patient to call back   ----- Message from Alver Sorrow sent at 04/21/2023  9:25 AM EDT ----- LDL (bad cholesterol) not at goal <70. Ensure taking Rosuvastatin 20mg  daily. If taking regularly, increase to 40mg  daily. If not taking, resume. Repeat FLP/LFT in 2 months.

## 2023-04-23 MED ORDER — ROSUVASTATIN CALCIUM 40 MG PO TABS: 40.0000 mg | ORAL_TABLET | Freq: Every day | ORAL | 3 refills | Status: AC

## 2023-04-23 NOTE — Telephone Encounter (Signed)
 2nd call attempt, Results called to patient who verbalizes understanding! Rx to pharm & repeat labs orders.

## 2023-04-23 NOTE — Addendum Note (Signed)
 Addended by: Marlene Lard on: 04/23/2023 10:42 AM   Modules accepted: Orders

## 2023-04-28 ENCOUNTER — Encounter (HOSPITAL_BASED_OUTPATIENT_CLINIC_OR_DEPARTMENT_OTHER): Payer: Self-pay | Admitting: Cardiovascular Disease

## 2023-04-28 ENCOUNTER — Ambulatory Visit (HOSPITAL_BASED_OUTPATIENT_CLINIC_OR_DEPARTMENT_OTHER): Payer: Medicare PPO | Admitting: Cardiovascular Disease

## 2023-04-28 VITALS — BP 128/64 | HR 68 | Ht 77.0 in | Wt 312.6 lb

## 2023-04-28 DIAGNOSIS — I5032 Chronic diastolic (congestive) heart failure: Secondary | ICD-10-CM | POA: Diagnosis not present

## 2023-04-28 DIAGNOSIS — R0602 Shortness of breath: Secondary | ICD-10-CM

## 2023-04-28 DIAGNOSIS — I4892 Unspecified atrial flutter: Secondary | ICD-10-CM | POA: Diagnosis not present

## 2023-04-28 DIAGNOSIS — I10 Essential (primary) hypertension: Secondary | ICD-10-CM

## 2023-04-28 MED ORDER — EMPAGLIFLOZIN 10 MG PO TABS: 10.0000 mg | ORAL_TABLET | Freq: Every day | ORAL | Status: AC

## 2023-04-28 MED ORDER — EMPAGLIFLOZIN 10 MG PO TABS: 10.0000 mg | ORAL_TABLET | Freq: Every day | ORAL | 5 refills | Status: DC

## 2023-04-28 NOTE — Progress Notes (Signed)
 Cardiology Office Note:  .   Date:  04/28/2023  ID:  Pedro Lee, DOB November 08, 1940, MRN [id number] PCP: Gwenyth Bender, MD  [facility] Providers Cardiologist:  Chilton Si, MD    History of Present Illness: .   Pedro Lee is a 83 y.o. 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 He was initially seen 05/06/2020 to establish care in the hypertension clinic.  He saw Dr. Katrinka Blazing in 2019 for exertional dyspnea.  His symptoms were thought to be due to deconditioning, hypertension, and obesity.  He subsequently followed up with Herma Carson, PA-C, on 10/2019.  At that time his blood pressure was running high.  Prior to that he had seen his PCP and doxazosin was reduced due to low blood pressure and exertional dyspnea.  He had a [employer] 10/2019 that was negative for ischemia.  Systolic function was normal.  He saw Herma Carson back 11/2019 and his doxazosin was increased to 2 mg twice daily.  He had a virtual follow-up 11/2019 and his blood pressure was running high in the evenings.  He was working out and limiting his sodium intake.  They decided to increase his doxazosin because he was also struggling with his prostate.  He was referred to advanced hypertension clinic for further management.  Lately his BP has been ranging 130-170s/70-90s.  He has been mostly checking it in the mornings.  He got a new machine about 2 months ago.   [name] had his pituitary tumor removed about 5 years ago.  He does not think he follows up with endocrinology.  He lost his wife of 54 years in May 2021 and has struggled since that time.  Thyroid function and cortisol are within normal limits.  At his initial visit he reported leg discomfort.  He had ABIs 01/2020 that were within normal limits.  His symptoms were thought to be mostly due to venous insufficiency and he was  given furosemide to take as needed.  He saw our PharmD 02/2020 and hydralazine was added to his regimen.  On 03/2020 he had increased LE edema and was not using furosemide.  He was confused about his medications and carvedilol and doxazosin were increased. We discussed taking his medications more consistently at the same time each day. Hydralazine was stopped and he was started on amlodipine. He had repeat renal artery dopplers 01/27/2021, which continued to have mild to moderate blockage bilaterally. Amlodipine was stopped due to LE edema and hydralazine was increased. His blood pressures started getting too low, so doxazosin was reduced.  At his visit 01/2021 doxazosin was reduced due to hypotension.  Carvedilol was then reduced and doxazosin was resumed due to higher blood pressures.  Echo 08/2021 revealed LVEF 65-70% with moderate LVH and grade 1 diastolic dysfunction.  Ascending aorta was 4.1 cm.    Mr. 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  He subsequently developed new onset atrial flutter 08/2022.  He underwent successful DCCV 09/2022 after being on anticoagulation.  LVEF at the time was 55-60%.  There was moderate biatrial enlargement.  There is also mention of a 4.8 cm abdominal aortic aneurysm which was not verified on abdominal CT.  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  He followed up with Gillian Shields, NP 10/2022 and was feeling well.  He was maintaining sinus rhythm.  He had repeat renal artery Dopplers that revealed 1-59% stenosis of bilateral renal arteries and 70-99% of the SMA.  [name] has been experiencing shortness of breath for several months, particularly during activities such as bringing his garbage can up a slight incline. He describes the sensation as a pressure in his chest but denies  significant chest pain. His breathing is not problematic when lying down. No associated swelling in the legs or orthopnea.  He monitors his blood pressure at home, noting fluctuations with readings ranging from 120/60 to 140/90 mmHg. During recent visits to Dr. August Saucer, his blood pressure was noted to be low. He has a history of hypertension and takes Lasix daily.  He has been using ibuprofen frequently but has recently switched to Tylenol for pain management due to kidney concerns. He has mild chronic kidney disease.  He mentions a recent fall on a gravel driveway, which he attributes to balance issues. During this incident, he was unable to get up for about thirty minutes until someone assisted him. He has not experienced any other falls recently.  He has a history of a fallen arch in his right ankle, which causes discomfort. He wears a brace and compression stocking for support. He reports wearing compression stockings for leg swelling, which has not increased beyond usual levels. No new swelling in his legs or feet.  He reports frequent urination, particularly at night, which he attributes to prostate issues. No urinary tract infections.      ROS:  As per HPI  Studies Reviewed: Marland Kitchen   EKG Interpretation Date/Time:  Wednesday April 28 2023 09:55:28 EDT Ventricular Rate:  63 PR Interval:  176 QRS Duration:  102 QT Interval:  384 QTC Calculation: 392 R Axis:   85  Text Interpretation: Normal sinus rhythm Cannot rule out Inferior infarct , age undetermined Cannot rule out Anterior infarct , age undetermined ST & T wave abnormality, consider lateral ischemia When compared with ECG of 27-Oct-2022 15:06, Minimal criteria for Inferior infarct are now Present T wave inversion now evident in Inferior leads Confirmed by Chilton Si ([id number]) on 04/28/2023 12:35:43 PM   Echo 09/2022: 1. Left ventricular ejection fraction, by estimation, is 55 to 60%. The  left ventricle has no regional wall motion  abnormalities. There is  moderate concentric left ventricular hypertrophy. Left ventricular  diastolic parameters are indeterminate.   2. Right ventricular systolic function is low normal. The right  ventricular size is mildly enlarged.   3. Left atrial size was moderately dilated.   4. Right atrial size was moderately dilated.   5. Mild mitral valve regurgitation.   6. Pulmonic valve regurgitation is moderate.   7. Adominial aorta measures 4.8 cm Recommend dedicated evaluation of  aorta.    EKG Interpretation Date/Time:  Wednesday April 28 2023 09:55:28 EDT Ventricular Rate:  63 PR Interval:  176 QRS Duration:  102 QT Interval:  384 QTC Calculation: 392 R Axis:   85  Text Interpretation: Normal sinus rhythm Cannot rule out Inferior infarct , age undetermined Cannot rule out Anterior infarct , age undetermined ST & T wave abnormality, consider lateral ischemia When compared with ECG of 27-Oct-2022 15:06, Minimal criteria for Inferior infarct are now Present T wave inversion now evident in Inferior leads Confirmed by Chilton Si ([id number]) on 04/28/2023 12:35:43 PM  Risk Assessment/Calculations:    CHA2DS2-VASc Score = 5   This indicates a 7.2% annual risk of stroke. 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 GENERAL:  Well appearing HEENT: Pupils equal round and reactive, fundi not visualized, oral mucosa unremarkable NECK:  No jugular venous distention, waveform within normal limits, carotid upstroke brisk and symmetric, no bruits, no thyromegaly LUNGS:  Clear to auscultation bilaterally HEART:  RRR.  PMI not displaced or sustained,S1 and  S2 within normal limits, no S3, no S4, no clicks, no rubs, no murmurs ABD:  Flat, positive bowel sounds normal in frequency in pitch, no bruits, no rebound, no guarding, no midline pulsatile mass, no hepatomegaly, no splenomegaly EXT:  2 plus pulses throughout, no edema, no cyanosis no clubbing SKIN:  No rashes no nodules NEURO:  Cranial nerves II through XII grossly intact, motor grossly intact throughout PSYCH:  Cognitively intact, oriented to person place and time  ASSESSMENT AND PLAN: .    # Shortness of breath with exertion Considers heart failure or coronary artery disease due to myocardial thickening. BNP and stress MRI to evaluate heart failure and coronary artery disease. SGLT2 inhibitor proposed for symptom relief and kidney protection. Discussed UTI risk. - Order BNP lab test. - Schedule stress MRI. - Start Jardiance 10mg  daily. - Monitor for urinary symptoms.  Patient denies UTI history but has significantly enlarged prostate.   # Hypertension BP controlled in the office but he reports elevated readings at home.  Patient will track and assess for a pattern and the need to titrate medications.   Stress MRI may provide guidance about whether he has a hypertensive vs infiltrative cardiomyopathy.  - Monitor blood pressure. - Continue amlodipine, doxazosin, and hydralazine.   # Chronic kidney disease 3 Mild renal impairment. Advised against ibuprofen. Starting SGLT2 inhibitor as above.  Tylenol recommended. - Discontinue ibuprofen. - Use Tylenol. - Start Jardiance as above.  -BP control as above.    # Atrial flutter: No recent events.  Continue Eliquis and carvedilol.   # Balance issues and recent fall Acknowledged balance issues and recent fall. No specific plan provided.  Follow-up Reassess after new treatment and tests. - Follow up in 6-8 weeks.       Informed Consent   Shared Decision Making/Informed Consent The risks [chest pain, shortness of breath, cardiac  arrhythmias, dizziness, blood pressure fluctuations, myocardial infarction, stroke/transient ischemic attack, nausea, vomiting, allergic reaction, and life-threatening complications (estimated to be 1 in 10,000)], benefits (risk stratification, diagnosing coronary artery disease, treatment guidance) and alternatives of a MRI stress test were discussed in detail with Mr. Shieh and he agrees to proceed.      Signed, Chilton Si, MD

## 2023-04-28 NOTE — Patient Instructions (Addendum)
 Medication Instructions:  START JARDIANCE 10  MG DAILY    *If you need a refill on your cardiac medications before your next appointment, please call your pharmacy*   Lab Work: BNP TODAY   If you have labs (blood work) drawn today and your tests are completely normal, you will receive your results only by: MyChart Message (if you have MyChart) OR A paper copy in the mail If you have any lab test that is abnormal or we need to change your treatment, we will call you to review the results.   Testing/Procedures: STRESS CARDIAC MRI   Follow-Up: At [facility], you and your health needs are our priority.  As part of our continuing mission to provide you with exceptional heart care, we have created designated Provider Care Teams.  These Care Teams include your primary Cardiologist (physician) and Advanced Practice Providers (APPs -  Physician Assistants and Nurse Practitioners) who all work together to provide you with the care you need, when you need it.  We recommend signing up for the patient portal called "MyChart".  Sign up information is provided on this After Visit Summary.  MyChart is used to connect with patients for Virtual Visits (Telemedicine).  Patients are able to view lab/test results, encounter notes, upcoming appointments, etc.  Non-urgent messages can be sent to your provider as well.   To learn more about what you can do with MyChart, go to [email or web address].    Your next appointment:   2 TO 3  MONTHS WITH CAITLIN W NP OR DR [facility]   Other Instructions  [facility] [address] [place], [place] [address] Please take advantage of the free valet parking available at the [facility] [employer] (Entrance C).  Proceed to the [facility] Radiology Department (First Floor) for check-in.   OR   [facility] [address] [place], [place] [address] Please go to the [facility] and check-in with the desk  attendant.   Magnetic resonance imaging (MRI) is a painless test that produces images of the inside of the body without using Xrays.  During an MRI, strong magnets and radio waves work together in a Data processing manager to form detailed images.   MRI images may provide more details about a medical condition than X-rays, CT scans, and ultrasounds can provide.  You may be given earphones to listen for instructions.  You may eat a light breakfast and take medications as ordered with the exception of furosemide, hydrochlorothiazide, chlorthalidone or spironolactone (or any other fluid pill). If you are undergoing a stress MRI, please avoid stimulants for 12 hr prior to test. (I.e. Caffeine, nicotine, chocolate, or antihistamine medications)  An IV will be inserted into one of your veins. Contrast material will be injected into your IV. It will leave your body through your urine within a day. You may be told to drink plenty of fluids to help flush the contrast material out of your system.  You will be asked to remove all metal, including: Watch, jewelry, and other metal objects including hearing aids, hair pieces and dentures. Also wearable glucose monitoring systems (ie. Freestyle Libre and Omnipods) (Braces and fillings normally are not a problem.)   TEST WILL TAKE APPROXIMATELY 1 HOUR  PLEASE NOTIFY SCHEDULING AT LEAST 24 HOURS IN ADVANCE IF YOU ARE UNABLE TO KEEP YOUR APPOINTMENT. [phone]  For more information and frequently asked questions, please visit our website : [email or web address]  Please call the Cardiac Imaging Nurse Navigators  with any questions/concerns. [phone] Office

## 2023-04-29 LAB — BRAIN NATRIURETIC PEPTIDE: BNP: 46.9 pg/mL (ref 0.0–100.0)

## 2023-05-20 DIAGNOSIS — H02403 Unspecified ptosis of bilateral eyelids: Secondary | ICD-10-CM | POA: Diagnosis not present

## 2023-05-20 DIAGNOSIS — Z7901 Long term (current) use of anticoagulants: Secondary | ICD-10-CM | POA: Diagnosis not present

## 2023-06-14 ENCOUNTER — Other Ambulatory Visit (HOSPITAL_COMMUNITY): Payer: Self-pay | Admitting: *Deleted

## 2023-06-14 DIAGNOSIS — Z01810 Encounter for preprocedural cardiovascular examination: Secondary | ICD-10-CM

## 2023-06-15 ENCOUNTER — Telehealth (HOSPITAL_COMMUNITY): Payer: Self-pay | Admitting: *Deleted

## 2023-06-15 NOTE — Telephone Encounter (Signed)
 Reaching out to patient to offer assistance regarding upcoming cardiac imaging study; pt verbalizes understanding of appt date/time, parking situation and where to check in; name and call back number provided for further questions should they arise  Chase Copping RN Navigator Cardiac Imaging [facility] [phone] office [phone] cell  Patient aware to avoid caffeine and denies claustrophobia or metal other than a knee replacement.

## 2023-06-16 ENCOUNTER — Ambulatory Visit (HOSPITAL_COMMUNITY)
Admission: RE | Admit: 2023-06-16 | Discharge: 2023-06-16 | Disposition: A | Discharge status: Home / Self Care | Source: Ambulatory Visit | Attending: Cardiovascular Disease | Admitting: Cardiovascular Disease

## 2023-06-16 ENCOUNTER — Other Ambulatory Visit (HOSPITAL_BASED_OUTPATIENT_CLINIC_OR_DEPARTMENT_OTHER): Payer: Self-pay | Admitting: Cardiovascular Disease

## 2023-06-16 ENCOUNTER — Other Ambulatory Visit: Payer: Self-pay

## 2023-06-16 ENCOUNTER — Encounter: Payer: Self-pay | Admitting: Cardiology

## 2023-06-16 DIAGNOSIS — I10 Essential (primary) hypertension: Secondary | ICD-10-CM | POA: Insufficient documentation

## 2023-06-16 DIAGNOSIS — I5032 Chronic diastolic (congestive) heart failure: Secondary | ICD-10-CM

## 2023-06-16 DIAGNOSIS — Z01810 Encounter for preprocedural cardiovascular examination: Secondary | ICD-10-CM | POA: Diagnosis not present

## 2023-06-16 DIAGNOSIS — I110 Hypertensive heart disease with heart failure: Secondary | ICD-10-CM | POA: Insufficient documentation

## 2023-06-16 MED ORDER — NITROGLYCERIN 0.4 MG SL SUBL
SUBLINGUAL_TABLET | SUBLINGUAL | Status: AC
  Filled 2023-06-16: qty 1

## 2023-06-16 MED ORDER — AMINOPHYLLINE 25 MG/ML IV SOLN
INTRAVENOUS | Status: AC
  Filled 2023-06-16: qty 10

## 2023-06-16 MED ORDER — ALBUTEROL SULFATE HFA 108 (90 BASE) MCG/ACT IN AERS
INHALATION_SPRAY | RESPIRATORY_TRACT | Status: AC
  Filled 2023-06-16: qty 6.7

## 2023-06-16 MED ORDER — REGADENOSON 0.4 MG/5ML IV SOLN
0.4000 mg | Freq: Once | INTRAVENOUS | Status: AC
  Administered 2023-06-16: 0.4 mg via INTRAVENOUS
  Filled 2023-06-16: qty 5

## 2023-06-16 MED ORDER — GADOBUTROL 1 MMOL/ML IV SOLN
10.0000 mL | Freq: Once | INTRAVENOUS | Status: AC | PRN
  Administered 2023-06-16: 10 mL via INTRAVENOUS

## 2023-06-16 MED ORDER — METOPROLOL TARTRATE 5 MG/5ML IV SOLN
INTRAVENOUS | Status: AC
  Filled 2023-06-16: qty 5

## 2023-06-16 MED ORDER — REGADENOSON 0.4 MG/5ML IV SOLN
INTRAVENOUS | Status: AC
  Filled 2023-06-16: qty 5

## 2023-06-16 NOTE — Progress Notes (Signed)
 Patient presents for stress MRI.  BP 163/70, HR 58.  No caffeine intake in prior 12 hours. No wheezing on exam.  EKG today shows sinus bradycardia, rate 58   Shared Decision Making/Informed Consent The risks [chest pain, shortness of breath, cardiac arrhythmias, dizziness, blood pressure fluctuations, myocardial infarction, stroke/transient ischemic attack, nausea, vomiting, allergic reaction, and life-threatening complications (estimated to be 1 in 10,000)], benefits (risk stratification, diagnosing coronary artery disease, treatment guidance) and alternatives of a MRI stress test were discussed in detail with patient and they agree to proceed.  Wendie Hamburg, MD

## 2023-06-17 ENCOUNTER — Encounter: Payer: Self-pay | Admitting: Podiatry

## 2023-06-17 ENCOUNTER — Ambulatory Visit: Payer: Medicare PPO | Admitting: Podiatry

## 2023-06-17 DIAGNOSIS — B351 Tinea unguium: Secondary | ICD-10-CM

## 2023-06-17 DIAGNOSIS — Z7901 Long term (current) use of anticoagulants: Secondary | ICD-10-CM | POA: Diagnosis not present

## 2023-06-17 MED ORDER — CICLOPIROX 8 % EX SOLN: Freq: Every day | CUTANEOUS | 2 refills | Status: DC

## 2023-06-17 NOTE — Progress Notes (Signed)
 Subjective: Chief Complaint  Patient presents with   RFC    RM#12 RFC patient has no concerns at this time.     83 year old male presents the office today for concerns of thick, elongated nails he is not able to trim himself.  Does not report any open lesions.  States he gets some discomfort of the left foot but he thinks it because it is more phantom pains due to not having his toe.  He does not report any injuries.  Increasing swelling.  No open lesions.  He is on Eliquis   Objective: AAO x3, NAD DP/PT pulses palpable bilaterally, CRT less than 3 seconds Nails are hypertrophic, dystrophic, brittle, discolored, elongated 8. No surrounding redness or drainage. Tenderness nails 1-5 on the right and 3 through 5 on the left. No open lesions or pre-ulcerative lesions are identified today. Decreased medial arch upon weightbearing.  Not able to appreciate any area pinpoint tenderness. No pain with calf compression, swelling, warmth, erythema  Assessment: Symptomatic onychosis  Plan: -All treatment options discussed with the patient including all alternatives, risks, complications.  -I offered to get x-rays with him but he decided to hold off on them today.  Continue supportive shoe gear and will continue monitor symptoms. -Sharply debrided nails x 8 without any complications or bleeding -Daily foot inspection  No follow-ups on file.  Charity Conch DPM

## 2023-06-28 ENCOUNTER — Ambulatory Visit (HOSPITAL_BASED_OUTPATIENT_CLINIC_OR_DEPARTMENT_OTHER): Payer: Self-pay | Admitting: Cardiovascular Disease

## 2023-06-28 DIAGNOSIS — R911 Solitary pulmonary nodule: Secondary | ICD-10-CM

## 2023-06-30 NOTE — Addendum Note (Signed)
 Addended by: Zigmund Hills on: 06/30/2023 05:37 PM   Modules accepted: Orders

## 2023-06-30 NOTE — Telephone Encounter (Signed)
Pt returning call in regards to results.  ?

## 2023-07-01 DIAGNOSIS — J301 Allergic rhinitis due to pollen: Secondary | ICD-10-CM | POA: Diagnosis not present

## 2023-07-01 DIAGNOSIS — G542 Cervical root disorders, not elsewhere classified: Secondary | ICD-10-CM | POA: Diagnosis not present

## 2023-07-01 DIAGNOSIS — G4733 Obstructive sleep apnea (adult) (pediatric): Secondary | ICD-10-CM | POA: Diagnosis not present

## 2023-07-01 DIAGNOSIS — I69911 Memory deficit following unspecified cerebrovascular disease: Secondary | ICD-10-CM | POA: Diagnosis not present

## 2023-07-01 DIAGNOSIS — I951 Orthostatic hypotension: Secondary | ICD-10-CM | POA: Diagnosis not present

## 2023-07-01 DIAGNOSIS — N1831 Chronic kidney disease, stage 3a: Secondary | ICD-10-CM | POA: Diagnosis not present

## 2023-07-01 DIAGNOSIS — R8290 Unspecified abnormal findings in urine: Secondary | ICD-10-CM | POA: Diagnosis not present

## 2023-07-01 DIAGNOSIS — M109 Gout, unspecified: Secondary | ICD-10-CM | POA: Diagnosis not present

## 2023-07-01 DIAGNOSIS — E119 Type 2 diabetes mellitus without complications: Secondary | ICD-10-CM | POA: Diagnosis not present

## 2023-07-01 DIAGNOSIS — I872 Venous insufficiency (chronic) (peripheral): Secondary | ICD-10-CM | POA: Diagnosis not present

## 2023-07-06 ENCOUNTER — Telehealth: Payer: Self-pay | Admitting: Cardiovascular Disease

## 2023-07-06 NOTE — Telephone Encounter (Signed)
 Agree with your recommendation. Thank you for talking with him.   Oluwakemi Salsberry S Tyrese Capriotti, NP

## 2023-07-06 NOTE — Telephone Encounter (Signed)
 Spoke with patient regarding Jardiance   Saw PCP last week, having right sided back pain in the kidney area Was given Doxycycline  for UTI, additional labs done as well.  Called PCP to get copy of labs  He has been taking Doxycycline  however he stopped his Jardiance  over the weekend Pain level Thursday was 5 out of 10, today pain level 8  Per patient PCP didn't feel related to the Jardiance   Advised patient to reach out to PCP since Dr Theodis Fiscal out of the office

## 2023-07-06 NOTE — Telephone Encounter (Signed)
 Pt c/o medication issue:  1. Name of Medication:   empagliflozin  (JARDIANCE ) 10 MG TABS tablet    2. How are you currently taking this medication (dosage and times per day)? As written   3. Are you having a reaction (difficulty breathing--STAT)? No   4. What is your medication issue? Pt called in stating he thinks this medication is making his kidneys hurt.  He states his PCP put him on doxycycline  to help clear but he would like to still discuss this issue. Please advise.

## 2023-07-07 NOTE — Addendum Note (Signed)
 Addended by: Marci Setter B on: 07/07/2023 12:10 PM   Modules accepted: Orders

## 2023-07-07 NOTE — Telephone Encounter (Signed)
 Spoke with patient and reviewed recommendations.  He had not reached out to PCP and was still having pain.  Advised patient again needs to reach out to PCP, verbalized understanding

## 2023-07-08 ENCOUNTER — Ambulatory Visit (HOSPITAL_BASED_OUTPATIENT_CLINIC_OR_DEPARTMENT_OTHER)
Admission: RE | Admit: 2023-07-08 | Discharge: 2023-07-08 | Disposition: A | Discharge status: Home / Self Care | Source: Ambulatory Visit | Attending: Cardiovascular Disease | Admitting: Cardiovascular Disease

## 2023-07-08 DIAGNOSIS — R911 Solitary pulmonary nodule: Secondary | ICD-10-CM | POA: Diagnosis not present

## 2023-07-08 DIAGNOSIS — I77810 Thoracic aortic ectasia: Secondary | ICD-10-CM | POA: Diagnosis not present

## 2023-07-08 DIAGNOSIS — R918 Other nonspecific abnormal finding of lung field: Secondary | ICD-10-CM | POA: Diagnosis not present

## 2023-07-14 ENCOUNTER — Ambulatory Visit
Admission: RE | Admit: 2023-07-14 | Discharge: 2023-07-14 | Disposition: A | Discharge status: Home / Self Care | Source: Ambulatory Visit | Attending: Internal Medicine | Admitting: Internal Medicine

## 2023-07-14 ENCOUNTER — Other Ambulatory Visit: Payer: Self-pay | Admitting: Internal Medicine

## 2023-07-14 DIAGNOSIS — I69911 Memory deficit following unspecified cerebrovascular disease: Secondary | ICD-10-CM | POA: Diagnosis not present

## 2023-07-14 DIAGNOSIS — R109 Unspecified abdominal pain: Secondary | ICD-10-CM

## 2023-07-14 DIAGNOSIS — N390 Urinary tract infection, site not specified: Secondary | ICD-10-CM | POA: Diagnosis not present

## 2023-07-14 DIAGNOSIS — G4733 Obstructive sleep apnea (adult) (pediatric): Secondary | ICD-10-CM | POA: Diagnosis not present

## 2023-07-14 DIAGNOSIS — E119 Type 2 diabetes mellitus without complications: Secondary | ICD-10-CM | POA: Diagnosis not present

## 2023-07-14 DIAGNOSIS — R8290 Unspecified abnormal findings in urine: Secondary | ICD-10-CM | POA: Diagnosis not present

## 2023-07-14 DIAGNOSIS — N1831 Chronic kidney disease, stage 3a: Secondary | ICD-10-CM | POA: Diagnosis not present

## 2023-07-14 DIAGNOSIS — R10811 Right upper quadrant abdominal tenderness: Secondary | ICD-10-CM | POA: Diagnosis not present

## 2023-07-14 DIAGNOSIS — K5900 Constipation, unspecified: Secondary | ICD-10-CM | POA: Diagnosis not present

## 2023-07-14 DIAGNOSIS — R319 Hematuria, unspecified: Secondary | ICD-10-CM | POA: Diagnosis not present

## 2023-07-14 DIAGNOSIS — M109 Gout, unspecified: Secondary | ICD-10-CM | POA: Diagnosis not present

## 2023-07-16 ENCOUNTER — Ambulatory Visit: Payer: Self-pay | Admitting: Cardiovascular Disease

## 2023-07-16 DIAGNOSIS — R911 Solitary pulmonary nodule: Secondary | ICD-10-CM

## 2023-07-22 DIAGNOSIS — I10 Essential (primary) hypertension: Secondary | ICD-10-CM | POA: Diagnosis not present

## 2023-07-22 DIAGNOSIS — H02403 Unspecified ptosis of bilateral eyelids: Secondary | ICD-10-CM | POA: Diagnosis not present

## 2023-08-10 DIAGNOSIS — R8290 Unspecified abnormal findings in urine: Secondary | ICD-10-CM | POA: Diagnosis not present

## 2023-08-17 DIAGNOSIS — J301 Allergic rhinitis due to pollen: Secondary | ICD-10-CM | POA: Diagnosis not present

## 2023-08-17 DIAGNOSIS — G4733 Obstructive sleep apnea (adult) (pediatric): Secondary | ICD-10-CM | POA: Diagnosis not present

## 2023-08-17 DIAGNOSIS — N1831 Chronic kidney disease, stage 3a: Secondary | ICD-10-CM | POA: Diagnosis not present

## 2023-08-17 DIAGNOSIS — N390 Urinary tract infection, site not specified: Secondary | ICD-10-CM | POA: Diagnosis not present

## 2023-08-17 DIAGNOSIS — M109 Gout, unspecified: Secondary | ICD-10-CM | POA: Diagnosis not present

## 2023-08-17 DIAGNOSIS — E119 Type 2 diabetes mellitus without complications: Secondary | ICD-10-CM | POA: Diagnosis not present

## 2023-08-17 DIAGNOSIS — I69911 Memory deficit following unspecified cerebrovascular disease: Secondary | ICD-10-CM | POA: Diagnosis not present

## 2023-08-17 DIAGNOSIS — I119 Hypertensive heart disease without heart failure: Secondary | ICD-10-CM | POA: Diagnosis not present

## 2023-08-17 DIAGNOSIS — K5900 Constipation, unspecified: Secondary | ICD-10-CM | POA: Diagnosis not present

## 2023-08-19 ENCOUNTER — Ambulatory Visit (HOSPITAL_BASED_OUTPATIENT_CLINIC_OR_DEPARTMENT_OTHER): Admitting: Cardiovascular Disease

## 2023-08-19 ENCOUNTER — Encounter (HOSPITAL_BASED_OUTPATIENT_CLINIC_OR_DEPARTMENT_OTHER): Payer: Self-pay | Admitting: Cardiovascular Disease

## 2023-08-19 VITALS — BP 112/56 | HR 64 | Ht 77.0 in | Wt 303.5 lb

## 2023-08-19 DIAGNOSIS — I483 Typical atrial flutter: Secondary | ICD-10-CM | POA: Diagnosis not present

## 2023-08-19 DIAGNOSIS — I10 Essential (primary) hypertension: Secondary | ICD-10-CM | POA: Diagnosis not present

## 2023-08-19 DIAGNOSIS — I5032 Chronic diastolic (congestive) heart failure: Secondary | ICD-10-CM | POA: Diagnosis not present

## 2023-08-19 NOTE — Patient Instructions (Signed)
 Medication Instructions:  Your physician recommends that you continue on your current medications as directed. Please refer to the Current Medication list given to you today.  *If you need a refill on your cardiac medications before your next appointment, please call your pharmacy*  Lab Work: NONE  Testing/Procedures: NONE  Follow-Up: At [facility], you and your health needs are our priority.  As part of our continuing mission to provide you with exceptional heart care, we have created designated Provider Care Teams.  These Care Teams include your primary Cardiologist (physician) and Advanced Practice Providers (APPs -  Physician Assistants and Nurse Practitioners) who all work together to provide you with the care you need, when you need it.  We recommend signing up for the patient portal called MyChart.  Sign up information is provided on this After Visit Summary.  MyChart is used to connect with patients for Virtual Visits (Telemedicine).  Patients are able to view lab/test results, encounter notes, upcoming appointments, etc.  Non-urgent messages can be sent to your provider as well.   To learn more about what you can do with MyChart, go to [email or web address].    Your next appointment:   6 month(s)  The format for your next appointment:   In Person  Provider:   DR Nino Bass NP, OR Charlton Cooler NP

## 2023-08-19 NOTE — Progress Notes (Signed)
 Cardiology Office Note:  .   Date:  08/19/2023  ID:  Pedro Lee, DOB 08/31/1940, MRN [id number] PCP: Addie Camellia CROME, MD  [facility] Providers Cardiologist:  Annabella Scarce, MD    History of Present Illness: .    Pedro Lee is a 83 y.o. 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 He was initially seen 05/06/2020 to establish care in the hypertension clinic.  He saw Dr. Sharps in 2019 for exertional dyspnea.  His symptoms were thought to be due to deconditioning, hypertension, and obesity.  He subsequently followed up with Rosaline Pavy, PA-C, on 10/2019.  At that time his blood pressure was running high.  Prior to that he had seen his PCP and doxazosin  was reduced due to low blood pressure and exertional dyspnea.  He had a Lexiscan  Myoview  10/2019 that was negative for ischemia.  Systolic function was normal.  He saw Rosaline Pavy back 11/2019 and his doxazosin  was increased to 2 mg twice daily.  He had a virtual follow-up 11/2019 and his blood pressure was running high in the evenings.  He was working out and limiting his sodium intake.  They decided to increase his doxazosin  because he was also struggling with his prostate.  He was referred to advanced hypertension clinic for further management.  Lately his BP has been ranging 130-170s/70-90s.  He has been mostly checking it in the mornings.  He got a new machine about 2 months ago.   Pedro Lee had his pituitary tumor removed about 5 years ago.  He does not think he follows up with endocrinology.  He lost his wife of 54 years in May 2021 and has struggled since that time.  Thyroid  function and cortisol are within normal limits.  At his initial visit he reported leg discomfort.  He had ABIs 01/2020 that were within normal limits.  His symptoms were thought to be mostly due to venous insufficiency and he was  given furosemide  to take as needed.  He saw our PharmD 02/2020 and hydralazine  was added to his regimen.  On 03/2020 he had increased LE edema and was not using furosemide .  He was confused about his medications and carvedilol  and doxazosin  were increased. We discussed taking his medications more consistently at the same time each day. Hydralazine  was stopped and he was started on amlodipine . He had repeat renal artery dopplers 01/27/2021, which continued to have mild to moderate blockage bilaterally. Amlodipine  was stopped due to LE edema and hydralazine  was increased. His blood pressures started getting too low, so doxazosin  was reduced.   At his visit 01/2021 doxazosin  was reduced due to hypotension.  Carvedilol  was then reduced and doxazosin  was resumed due to higher blood pressures.  Echo 08/2021 revealed LVEF 65-70% with moderate LVH and grade 1 diastolic dysfunction.  Ascending aorta was 4.1 cm.     Pedro Lee was admitted 07/2022 with osteomyelitis of the left great toe.  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  He subsequently developed new onset atrial flutter 08/2022.  He underwent successful DCCV 09/2022 after being on anticoagulation.  LVEF at the time was 55-60%.  There was moderate biatrial enlargement.  There is also mention of a 4.8 cm abdominal aortic aneurysm which was not verified on abdominal CT.  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  He followed up with Reche Finder, NP 10/2022 and was feeling well.  He was maintaining sinus rhythm.  He had repeat renal artery Dopplers that revealed 1-59% stenosis of bilateral renal arteries and 70-99% of the SMA.  At his visit 04/2023 he reported exertional dyspnea.  BP ranged 120/60 to 140/90 mmHg.  BNP was 46.9.  He had a stress MRI which revealed LVEF 58%, mild LVH and no ischemia.  The ascending aorta was 4.0cm.  There was a pulmonary nodule  which was again noted on chest CT.  Follow up in 6-12 months was recommended.  He struggled with UTIs and stopped Jardiance .  Discussed the use of AI scribe software for clinical note transcription with the patient, who gave verbal consent to proceed.  History of Present Illness Pedro Lee notes that over the past three to four days, his leg swelling has improved significantly.  He has been on amlodipine  for blood pressure management and does not believe it has affected his swelling. He also takes carvedilol , hydralazine , and Lasix  as needed, though he has not used Lasix  recently due to inconvenience and lack of significant swelling. His blood pressure at home has been running slightly higher than the office reading, with a recent measurement of 117/70 mmHg, but generally stays under 130 mmHg.  He started a lawn care business, which involves physical activity, though he does not consider it formal exercise. No chest pain is reported, and his breathing is okay except for some issues with his ankles.  He was previously prescribed Jardiance  to help with fluid retention and shortness of breath, but he stopped taking it without noticing any difference in symptoms. He experienced a urinary infection, which is a known side effect of Jardiance , but this was not the reason for discontinuation.  He is on Eliquis  for atrial flutter and reports no issues with heart racing or palpitations.  ROS:  As per HPI  Studies Reviewed: SABRA       Stress MRI:  IMPRESSION: 1.  No evidence of ischemia on stress perfusion imaging   2.  No late gadolinium enhancement to suggest myocardial scar   3. Normal LV size, mild hypertrophy, and normal systolic function (EF 58%)   4.  Normal RV size and systolic function (EF 53%)   5.  Dilated ascending aorta measuring 40mm   6. LUL pulmonary nodule, not well visualized, recommend CT chest for further evaluation   Risk Assessment/Calculations:             Physical  Exam:   VS:  BP (!) 112/56 (BP Location: Right Arm, Patient Position: Sitting, Cuff Size: Large)   Pulse 64   Ht 6' 5 (1.956 m)   Wt (!) 303 lb 8 oz (137.7 kg)   SpO2 94%   BMI 35.99 kg/m  , BMI Body mass index is 35.99 kg/m. GENERAL:  Well appearing HEENT: Pupils equal round and reactive, fundi not visualized, oral mucosa unremarkable NECK:  No jugular venous distention, waveform within normal limits, carotid upstroke brisk and symmetric, no bruits, no thyromegaly LUNGS:  Clear to auscultation bilaterally HEART:  RRR.  PMI not displaced or sustained,S1 and S2 within normal limits, no S3, no S4, no clicks, no rubs, no murmurs ABD:  Flat, positive bowel sounds normal in frequency in pitch, no bruits, no rebound, no guarding, no midline pulsatile mass, no hepatomegaly, no splenomegaly EXT:  2 plus pulses throughout, + LE edema, no cyanosis no clubbing  SKIN:  No rashes no nodules NEURO:  Cranial nerves II through XII grossly intact, motor grossly intact throughout PSYCH:  Cognitively intact, oriented to person place and time   ASSESSMENT AND PLAN: .    Assessment & Plan # Atrial flutter Atrial flutter is well-managed with no recent episodes.  Currently sinus rhythm. - Continue Eliquis .  # Hypertension Blood pressure is well-controlled with current regimen. Home readings slightly higher but average below 130/80 mmHg. - Continue amlodipine , carvedilol , hydralazine , and Lasix  as needed.  # HFpEF:  BNP within normal limits.  He stopped Jardiance  2/2 UTI and no perceived symptomatic improvement.  Continue lasix  as needed and BP control as above.   # Hyperlipidemia:  Continue rosuvastatin .    Dispo: f/u 6 months  Signed, Annabella Scarce, MD

## 2023-09-02 DIAGNOSIS — H02403 Unspecified ptosis of bilateral eyelids: Secondary | ICD-10-CM | POA: Diagnosis not present

## 2023-09-16 DIAGNOSIS — I4892 Unspecified atrial flutter: Secondary | ICD-10-CM | POA: Diagnosis not present

## 2023-09-16 DIAGNOSIS — H02403 Unspecified ptosis of bilateral eyelids: Secondary | ICD-10-CM | POA: Diagnosis not present

## 2023-09-16 DIAGNOSIS — N1831 Chronic kidney disease, stage 3a: Secondary | ICD-10-CM | POA: Diagnosis not present

## 2023-09-16 DIAGNOSIS — I130 Hypertensive heart and chronic kidney disease with heart failure and stage 1 through stage 4 chronic kidney disease, or unspecified chronic kidney disease: Secondary | ICD-10-CM | POA: Diagnosis not present

## 2023-09-16 DIAGNOSIS — Z880 Allergy status to penicillin: Secondary | ICD-10-CM | POA: Diagnosis not present

## 2023-09-16 DIAGNOSIS — I5032 Chronic diastolic (congestive) heart failure: Secondary | ICD-10-CM | POA: Diagnosis not present

## 2023-09-17 ENCOUNTER — Other Ambulatory Visit (HOSPITAL_BASED_OUTPATIENT_CLINIC_OR_DEPARTMENT_OTHER): Payer: Self-pay | Admitting: Family

## 2023-09-27 DIAGNOSIS — Z09 Encounter for follow-up examination after completed treatment for conditions other than malignant neoplasm: Secondary | ICD-10-CM | POA: Diagnosis not present

## 2023-10-19 DIAGNOSIS — N401 Enlarged prostate with lower urinary tract symptoms: Secondary | ICD-10-CM | POA: Diagnosis not present

## 2023-10-19 DIAGNOSIS — R350 Frequency of micturition: Secondary | ICD-10-CM | POA: Diagnosis not present

## 2023-10-19 DIAGNOSIS — N138 Other obstructive and reflux uropathy: Secondary | ICD-10-CM | POA: Diagnosis not present

## 2023-11-01 DIAGNOSIS — Z23 Encounter for immunization: Secondary | ICD-10-CM | POA: Diagnosis not present

## 2023-11-01 DIAGNOSIS — I483 Typical atrial flutter: Secondary | ICD-10-CM | POA: Diagnosis not present

## 2023-11-01 DIAGNOSIS — J301 Allergic rhinitis due to pollen: Secondary | ICD-10-CM | POA: Diagnosis not present

## 2023-11-01 DIAGNOSIS — M109 Gout, unspecified: Secondary | ICD-10-CM | POA: Diagnosis not present

## 2023-11-01 DIAGNOSIS — I5032 Chronic diastolic (congestive) heart failure: Secondary | ICD-10-CM | POA: Diagnosis not present

## 2023-11-01 DIAGNOSIS — I69911 Memory deficit following unspecified cerebrovascular disease: Secondary | ICD-10-CM | POA: Diagnosis not present

## 2023-11-01 DIAGNOSIS — I110 Hypertensive heart disease with heart failure: Secondary | ICD-10-CM | POA: Diagnosis not present

## 2023-11-01 DIAGNOSIS — E782 Mixed hyperlipidemia: Secondary | ICD-10-CM | POA: Diagnosis not present

## 2023-11-01 DIAGNOSIS — G4733 Obstructive sleep apnea (adult) (pediatric): Secondary | ICD-10-CM | POA: Diagnosis not present

## 2023-11-01 DIAGNOSIS — E119 Type 2 diabetes mellitus without complications: Secondary | ICD-10-CM | POA: Diagnosis not present

## 2023-11-01 DIAGNOSIS — K5900 Constipation, unspecified: Secondary | ICD-10-CM | POA: Diagnosis not present

## 2023-11-01 DIAGNOSIS — N1831 Chronic kidney disease, stage 3a: Secondary | ICD-10-CM | POA: Diagnosis not present

## 2023-11-02 DIAGNOSIS — H02401 Unspecified ptosis of right eyelid: Secondary | ICD-10-CM | POA: Diagnosis not present

## 2023-11-18 DIAGNOSIS — Z09 Encounter for follow-up examination after completed treatment for conditions other than malignant neoplasm: Secondary | ICD-10-CM | POA: Diagnosis not present

## 2024-01-14 ENCOUNTER — Ambulatory Visit (HOSPITAL_BASED_OUTPATIENT_CLINIC_OR_DEPARTMENT_OTHER)
Admission: RE | Admit: 2024-01-14 | Discharge: 2024-01-14 | Disposition: A | Source: Ambulatory Visit | Attending: Cardiovascular Disease

## 2024-01-14 DIAGNOSIS — R911 Solitary pulmonary nodule: Secondary | ICD-10-CM | POA: Diagnosis not present

## 2024-01-14 DIAGNOSIS — R918 Other nonspecific abnormal finding of lung field: Secondary | ICD-10-CM | POA: Diagnosis not present

## 2024-02-08 ENCOUNTER — Ambulatory Visit: Payer: Self-pay | Admitting: Cardiovascular Disease

## 2024-02-08 DIAGNOSIS — R911 Solitary pulmonary nodule: Secondary | ICD-10-CM

## 2024-02-11 NOTE — Telephone Encounter (Signed)
 Patient returned RN's call regarding results.  Patient stated can reach him after 12:00 noon.

## 2024-03-06 ENCOUNTER — Other Ambulatory Visit: Payer: Self-pay | Admitting: Family

## 2024-03-06 DIAGNOSIS — I10 Essential (primary) hypertension: Secondary | ICD-10-CM
